# Patient Record
Sex: Female | Born: 1960
Health system: Southern US, Community
[De-identification: ages and names within clinical notes are randomized; demographics above are authoritative.]

## PROBLEM LIST (undated history)

## (undated) DIAGNOSIS — A6000 Herpesviral infection of urogenital system, unspecified: Secondary | ICD-10-CM

## (undated) DIAGNOSIS — F32A Depression, unspecified: Secondary | ICD-10-CM

## (undated) DIAGNOSIS — F329 Major depressive disorder, single episode, unspecified: Secondary | ICD-10-CM

## (undated) DIAGNOSIS — M549 Dorsalgia, unspecified: Secondary | ICD-10-CM

## (undated) DIAGNOSIS — M1990 Unspecified osteoarthritis, unspecified site: Secondary | ICD-10-CM

## (undated) DIAGNOSIS — E119 Type 2 diabetes mellitus without complications: Secondary | ICD-10-CM

## (undated) DIAGNOSIS — B029 Zoster without complications: Secondary | ICD-10-CM

## (undated) DIAGNOSIS — E785 Hyperlipidemia, unspecified: Secondary | ICD-10-CM

## (undated) DIAGNOSIS — I10 Essential (primary) hypertension: Secondary | ICD-10-CM

## (undated) DIAGNOSIS — Z91013 Allergy to seafood: Secondary | ICD-10-CM

## (undated) HISTORY — DX: Herpesviral infection of urogenital system, unspecified: A60.00

## (undated) HISTORY — DX: Hyperlipidemia, unspecified: E78.5

## (undated) HISTORY — DX: Allergy to seafood: Z91.013

## (undated) HISTORY — PX: TYMPANOSTOMY TUBE PLACEMENT: SHX32

## (undated) HISTORY — DX: Major depressive disorder, single episode, unspecified: F32.9

## (undated) HISTORY — DX: Depression, unspecified: F32.A

## (undated) HISTORY — DX: Dorsalgia, unspecified: M54.9

## (undated) HISTORY — DX: Zoster without complications: B02.9

## (undated) HISTORY — DX: Type 2 diabetes mellitus without complications: E11.9

## (undated) HISTORY — DX: Essential (primary) hypertension: I10

---

## 2001-01-30 ENCOUNTER — Other Ambulatory Visit: Admission: RE | Admit: 2001-01-30 | Discharge: 2001-01-30 | Payer: Self-pay | Admitting: Family Medicine

## 2002-02-12 ENCOUNTER — Other Ambulatory Visit: Admission: RE | Admit: 2002-02-12 | Discharge: 2002-02-12 | Payer: Self-pay | Admitting: Family Medicine

## 2002-03-05 ENCOUNTER — Encounter: Admission: RE | Admit: 2002-03-05 | Discharge: 2002-03-05 | Payer: Self-pay | Admitting: Family Medicine

## 2002-03-05 ENCOUNTER — Encounter: Payer: Self-pay | Admitting: Family Medicine

## 2003-04-01 ENCOUNTER — Other Ambulatory Visit: Admission: RE | Admit: 2003-04-01 | Discharge: 2003-04-01 | Payer: Self-pay | Admitting: Family Medicine

## 2003-04-21 ENCOUNTER — Encounter: Payer: Self-pay | Admitting: Family Medicine

## 2003-04-21 ENCOUNTER — Encounter: Admission: RE | Admit: 2003-04-21 | Discharge: 2003-04-21 | Payer: Self-pay | Admitting: Family Medicine

## 2004-06-17 ENCOUNTER — Other Ambulatory Visit: Admission: RE | Admit: 2004-06-17 | Discharge: 2004-06-17 | Payer: Self-pay | Admitting: Family Medicine

## 2004-06-28 ENCOUNTER — Encounter: Admission: RE | Admit: 2004-06-28 | Discharge: 2004-06-28 | Payer: Self-pay | Admitting: Family Medicine

## 2004-11-18 ENCOUNTER — Ambulatory Visit: Payer: Self-pay | Admitting: Family Medicine

## 2004-12-14 ENCOUNTER — Ambulatory Visit: Payer: Self-pay | Admitting: Family Medicine

## 2004-12-26 ENCOUNTER — Ambulatory Visit: Payer: Self-pay | Admitting: Family Medicine

## 2005-09-25 ENCOUNTER — Ambulatory Visit: Payer: Self-pay | Admitting: Family Medicine

## 2006-01-30 ENCOUNTER — Encounter: Admission: RE | Admit: 2006-01-30 | Discharge: 2006-01-30 | Payer: Self-pay | Admitting: Family Medicine

## 2006-03-08 ENCOUNTER — Ambulatory Visit: Payer: Self-pay | Admitting: Family Medicine

## 2006-03-15 ENCOUNTER — Ambulatory Visit: Payer: Self-pay | Admitting: Family Medicine

## 2006-03-15 ENCOUNTER — Other Ambulatory Visit: Admission: RE | Admit: 2006-03-15 | Discharge: 2006-03-15 | Payer: Self-pay | Admitting: Family Medicine

## 2006-03-15 ENCOUNTER — Encounter: Payer: Self-pay | Admitting: Family Medicine

## 2006-06-12 ENCOUNTER — Ambulatory Visit: Payer: Self-pay | Admitting: Family Medicine

## 2006-06-28 ENCOUNTER — Ambulatory Visit: Payer: Self-pay | Admitting: Family Medicine

## 2006-10-11 ENCOUNTER — Ambulatory Visit: Payer: Self-pay | Admitting: Family Medicine

## 2006-10-22 ENCOUNTER — Ambulatory Visit: Payer: Self-pay | Admitting: Family Medicine

## 2007-02-08 ENCOUNTER — Ambulatory Visit: Payer: Self-pay | Admitting: Family Medicine

## 2007-02-13 ENCOUNTER — Ambulatory Visit: Payer: Self-pay | Admitting: Family Medicine

## 2007-02-28 ENCOUNTER — Encounter: Admission: RE | Admit: 2007-02-28 | Discharge: 2007-02-28 | Payer: Self-pay | Admitting: Family Medicine

## 2007-04-17 ENCOUNTER — Ambulatory Visit: Payer: Self-pay | Admitting: Family Medicine

## 2007-04-17 LAB — CONVERTED CEMR LAB
Alkaline Phosphatase: 56 units/L (ref 39–117)
Basophils Relative: 0.3 % (ref 0.0–1.0)
Bilirubin, Direct: 0.1 mg/dL (ref 0.0–0.3)
Chloride: 102 meq/L (ref 96–112)
Cholesterol: 194 mg/dL (ref 0–200)
Direct LDL: 102.5 mg/dL
Eosinophils Absolute: 0.2 10*3/uL (ref 0.0–0.6)
GFR calc Af Amer: 115 mL/min
GFR calc non Af Amer: 95 mL/min
HDL: 35.2 mg/dL — ABNORMAL LOW (ref >39.0)
Lymphocytes Relative: 25.9 % (ref 12.0–46.0)
Monocytes Relative: 6 % (ref 3.0–11.0)
Platelets: 221 10*3/uL (ref 150–400)
Potassium: 4 meq/L (ref 3.5–5.1)
RBC: 4.34 M/uL (ref 3.87–5.11)
RDW: 12.5 % (ref 11.5–14.6)
Sodium: 136 meq/L (ref 135–145)
TSH: 1.17 microintl units/mL (ref 0.35–5.50)
Total CHOL/HDL Ratio: 5.5
Triglycerides: 264 mg/dL (ref 0–149)
VLDL: 53 mg/dL — ABNORMAL HIGH (ref 0–40)

## 2007-04-24 ENCOUNTER — Ambulatory Visit: Payer: Self-pay | Admitting: Family Medicine

## 2007-04-24 ENCOUNTER — Other Ambulatory Visit: Admission: RE | Admit: 2007-04-24 | Discharge: 2007-04-24 | Payer: Self-pay | Admitting: Family Medicine

## 2007-08-01 ENCOUNTER — Telehealth: Payer: Self-pay | Admitting: Family Medicine

## 2007-08-05 DIAGNOSIS — J45909 Unspecified asthma, uncomplicated: Secondary | ICD-10-CM | POA: Insufficient documentation

## 2007-08-07 ENCOUNTER — Ambulatory Visit: Payer: Self-pay | Admitting: Family Medicine

## 2007-08-07 DIAGNOSIS — E785 Hyperlipidemia, unspecified: Secondary | ICD-10-CM | POA: Insufficient documentation

## 2007-08-07 DIAGNOSIS — F418 Other specified anxiety disorders: Secondary | ICD-10-CM | POA: Insufficient documentation

## 2007-08-19 LAB — CONVERTED CEMR LAB
HDL: 34.4 mg/dL — ABNORMAL LOW (ref >39.0)
Hgb A1c MFr Bld: 7.1 % — ABNORMAL HIGH (ref 4.6–6.0)
VLDL: 37 mg/dL (ref 0–40)

## 2007-11-29 ENCOUNTER — Telehealth: Payer: Self-pay | Admitting: Family Medicine

## 2007-12-04 ENCOUNTER — Encounter: Admission: RE | Admit: 2007-12-04 | Discharge: 2007-12-04 | Payer: Self-pay | Admitting: Family Medicine

## 2007-12-06 ENCOUNTER — Other Ambulatory Visit: Admission: RE | Admit: 2007-12-06 | Discharge: 2007-12-06 | Payer: Self-pay | Admitting: Family Medicine

## 2007-12-06 ENCOUNTER — Encounter: Payer: Self-pay | Admitting: Family Medicine

## 2007-12-06 ENCOUNTER — Ambulatory Visit: Payer: Self-pay | Admitting: Family Medicine

## 2007-12-09 LAB — CONVERTED CEMR LAB
ALT: 16 units/L (ref 0–35)
Albumin: 4.2 g/dL (ref 3.5–5.2)
Basophils Relative: 0.3 % (ref 0.0–1.0)
Bilirubin, Direct: 0.2 mg/dL (ref 0.0–0.3)
CO2: 29 meq/L (ref 19–32)
Calcium: 9.3 mg/dL (ref 8.4–10.5)
Cholesterol: 160 mg/dL (ref 0–200)
Creatinine, Ser: 1 mg/dL (ref 0.4–1.2)
GFR calc non Af Amer: 63 mL/min
Glucose, Bld: 112 mg/dL — ABNORMAL HIGH (ref 70–99)
Hgb A1c MFr Bld: 6.7 % — ABNORMAL HIGH (ref 4.6–6.0)
Lymphocytes Relative: 26.2 % (ref 12.0–46.0)
MCHC: 33.9 g/dL (ref 30.0–36.0)
MCV: 85.2 fL (ref 78.0–100.0)
Monocytes Absolute: 0.3 10*3/uL (ref 0.2–0.7)
Monocytes Relative: 5.6 % (ref 3.0–11.0)
Neutro Abs: 4 10*3/uL (ref 1.4–7.7)
Platelets: 245 10*3/uL (ref 150–400)
Potassium: 4.3 meq/L (ref 3.5–5.1)
RDW: 12.7 % (ref 11.5–14.6)
Sodium: 140 meq/L (ref 135–145)
Total Bilirubin: 0.8 mg/dL (ref 0.3–1.2)
Total CHOL/HDL Ratio: 3.9
Total Protein: 6.8 g/dL (ref 6.0–8.3)
VLDL: 27 mg/dL (ref 0–40)

## 2007-12-10 ENCOUNTER — Encounter: Payer: Self-pay | Admitting: Family Medicine

## 2008-12-24 ENCOUNTER — Telehealth: Payer: Self-pay | Admitting: Family Medicine

## 2009-02-23 ENCOUNTER — Encounter: Admission: RE | Admit: 2009-02-23 | Discharge: 2009-02-23 | Payer: Self-pay | Admitting: Family Medicine

## 2009-03-09 ENCOUNTER — Telehealth: Payer: Self-pay | Admitting: Family Medicine

## 2009-04-07 ENCOUNTER — Encounter: Payer: Self-pay | Admitting: Family Medicine

## 2009-04-13 ENCOUNTER — Encounter: Payer: Self-pay | Admitting: Family Medicine

## 2009-04-26 ENCOUNTER — Other Ambulatory Visit: Admission: RE | Admit: 2009-04-26 | Discharge: 2009-04-26 | Payer: Self-pay | Admitting: Family Medicine

## 2009-04-26 ENCOUNTER — Ambulatory Visit: Payer: Self-pay | Admitting: Family Medicine

## 2009-04-26 ENCOUNTER — Encounter: Payer: Self-pay | Admitting: Family Medicine

## 2009-04-26 DIAGNOSIS — E041 Nontoxic single thyroid nodule: Secondary | ICD-10-CM | POA: Insufficient documentation

## 2009-04-26 DIAGNOSIS — N951 Menopausal and female climacteric states: Secondary | ICD-10-CM | POA: Insufficient documentation

## 2009-04-29 ENCOUNTER — Encounter: Payer: Self-pay | Admitting: Family Medicine

## 2009-05-13 ENCOUNTER — Ambulatory Visit: Payer: Self-pay | Admitting: Gastroenterology

## 2009-05-20 ENCOUNTER — Encounter: Payer: Self-pay | Admitting: Gastroenterology

## 2009-05-20 ENCOUNTER — Ambulatory Visit: Payer: Self-pay | Admitting: Gastroenterology

## 2009-05-20 HISTORY — PX: OTHER SURGICAL HISTORY: SHX169

## 2009-05-25 ENCOUNTER — Encounter: Payer: Self-pay | Admitting: Gastroenterology

## 2010-02-16 ENCOUNTER — Ambulatory Visit: Payer: Self-pay | Admitting: Family Medicine

## 2010-02-16 DIAGNOSIS — R079 Chest pain, unspecified: Secondary | ICD-10-CM | POA: Insufficient documentation

## 2010-02-16 LAB — CONVERTED CEMR LAB
Bilirubin Urine: NEGATIVE
Glucose, Urine, Semiquant: NEGATIVE
Nitrite: NEGATIVE
Urobilinogen, UA: 0.2
WBC Urine, dipstick: NEGATIVE

## 2010-02-22 ENCOUNTER — Telehealth: Payer: Self-pay | Admitting: Family Medicine

## 2010-03-07 ENCOUNTER — Encounter: Admission: RE | Admit: 2010-03-07 | Discharge: 2010-03-07 | Payer: Self-pay | Admitting: Family Medicine

## 2010-04-11 ENCOUNTER — Telehealth: Payer: Self-pay | Admitting: Family Medicine

## 2010-04-21 ENCOUNTER — Encounter: Payer: Self-pay | Admitting: Family Medicine

## 2010-04-27 ENCOUNTER — Other Ambulatory Visit: Admission: RE | Admit: 2010-04-27 | Discharge: 2010-04-27 | Payer: Self-pay | Admitting: Family Medicine

## 2010-04-27 ENCOUNTER — Ambulatory Visit: Payer: Self-pay | Admitting: Family Medicine

## 2010-04-27 DIAGNOSIS — R109 Unspecified abdominal pain: Secondary | ICD-10-CM | POA: Insufficient documentation

## 2010-05-10 ENCOUNTER — Ambulatory Visit: Payer: Self-pay | Admitting: Internal Medicine

## 2010-05-17 ENCOUNTER — Encounter: Payer: Self-pay | Admitting: Family Medicine

## 2010-05-17 DIAGNOSIS — D259 Leiomyoma of uterus, unspecified: Secondary | ICD-10-CM | POA: Insufficient documentation

## 2010-05-25 ENCOUNTER — Ambulatory Visit: Payer: Self-pay | Admitting: Family Medicine

## 2010-05-25 DIAGNOSIS — L919 Hypertrophic disorder of the skin, unspecified: Secondary | ICD-10-CM

## 2010-05-25 DIAGNOSIS — L909 Atrophic disorder of skin, unspecified: Secondary | ICD-10-CM | POA: Insufficient documentation

## 2010-05-25 DIAGNOSIS — L989 Disorder of the skin and subcutaneous tissue, unspecified: Secondary | ICD-10-CM | POA: Insufficient documentation

## 2010-06-14 ENCOUNTER — Encounter: Admission: RE | Admit: 2010-06-14 | Discharge: 2010-06-14 | Payer: Self-pay | Admitting: Family Medicine

## 2010-06-23 ENCOUNTER — Encounter: Payer: Self-pay | Admitting: Family Medicine

## 2010-07-22 ENCOUNTER — Encounter: Admission: RE | Admit: 2010-07-22 | Discharge: 2010-07-22 | Payer: Self-pay | Admitting: Family Medicine

## 2010-11-06 DIAGNOSIS — B029 Zoster without complications: Secondary | ICD-10-CM

## 2010-11-06 HISTORY — DX: Zoster without complications: B02.9

## 2010-11-27 ENCOUNTER — Encounter: Payer: Self-pay | Admitting: Family Medicine

## 2010-11-28 ENCOUNTER — Encounter: Payer: Self-pay | Admitting: Family Medicine

## 2010-12-06 NOTE — Assessment & Plan Note (Signed)
Summary: skin tags removal-ok per judt//ccm   Vital Signs:  Patient profile:   50 year old female BP sitting:   136 / 72  (left arm) Cuff size:   large  Vitals Entered By: Raechel Ache, RN (May 25, 2010 1:11 PM) CC: Here for skin tag removal - breast and L thigh.   History of Present Illness: Here to remove 2 skin lesions that are sore and rub against her clothing. One is between the breasts and the other is on the left thigh.   Allergies: 1)  ! Biaxin  Physical Exam  Skin:  she has a tiny inflamed skin tag on the chest between the breasts. The other lesion is on the upper left thigh. This is soft and pliable with a thick base. Diameter is 1.2 cm    Impression & Recommendations:  Problem # 1:  SKIN LESION (ICD-709.9)  Orders: Excise lesion (TAL) 1.1 - 2.0 cm (11402)  Problem # 2:  SKIN TAG (ICD-701.9)  Orders: Removal of Skin Tags up to 15 Lesions (11200)  Patient Instructions: 1)  After cleansing the skin tag with alcohol, it was excised with scissors. dressed with Neosporin and a Bandaid. The larger lesion was cleansed with Betadine, and then LA was achieved using 2% Xylocaine with epinephrine. An elliptical incision was made around the base of the lesion to half of skin thickness, and it was removed in whole to send to Pathology. The wound was closed using 3 sutures of 4-0 Ethilon. Dressed with Neosporin and a Bandaid. The sutures are to be removed in 10 days

## 2010-12-06 NOTE — Miscellaneous (Signed)
Summary: Orders Update  Clinical Lists Changes  Problems: Added new problem of FIBROIDS, UTERUS (ICD-218.9) Orders: Added new Referral order of Radiology Referral (Radiology) - Signed

## 2010-12-06 NOTE — Assessment & Plan Note (Signed)
Summary: constant pain in lft side near kidneys/cjr   Vital Signs:  Patient profile:   50 year old female Weight:      206 pounds Temp:     98.2 degrees F oral BP sitting:   150 / 100  (left arm) Cuff size:   regular  Vitals Entered By: Raechel Ache, RN (February 16, 2010 4:25 PM) CC: C/o L flank pain x months- now is constant. Going out of town next week.   History of Present Illness: Here for a persistent mild sharp pain in the left ribs which has been present for 3 months. No hx of trauma that she can think of. The pain is more of a tenderness when she pushes on the spot, though she can feel it after prolonged sitting or stooping over. No cough or SOB. No back pain. She has not taken anything for this. No change in urinations or BMs. No heartburn or nausea.   Allergies: 1)  ! Biaxin  Past History:  Past Medical History: Reviewed history from 08/07/2007 and no changes required. Vaginal Herpes Asthma Depression Diabetes mellitus, type II Hyperlipidemia Hypertension  Past Surgical History: Reviewed history from 08/05/2007 and no changes required. PE Tubes  Review of Systems  The patient denies anorexia, fever, weight loss, weight gain, vision loss, decreased hearing, hoarseness, syncope, dyspnea on exertion, peripheral edema, prolonged cough, headaches, hemoptysis, abdominal pain, melena, hematochezia, severe indigestion/heartburn, hematuria, incontinence, genital sores, muscle weakness, suspicious skin lesions, transient blindness, difficulty walking, depression, unusual weight change, abnormal bleeding, enlarged lymph nodes, angioedema, breast masses, and testicular masses.    Physical Exam  General:  Well-developed,well-nourished,in no acute distress; alert,appropriate and cooperative throughout examination Neck:  No deformities, masses, or tenderness noted. Chest Wall:  tender in a very well defined spot in the left ribs in the midaxillary line at the level of the  nipple. No swelling or crepitus. Lungs:  Normal respiratory effort, chest expands symmetrically. Lungs are clear to auscultation, no crackles or wheezes. Heart:  Normal rate and regular rhythm. S1 and S2 normal without gallop, murmur, click, rub or other extra sounds. Abdomen:  Bowel sounds positive,abdomen soft and non-tender without masses, organomegaly or hernias noted. Skin:  Intact without suspicious lesions or rashes Cervical Nodes:  No lymphadenopathy noted Axillary Nodes:  No palpable lymphadenopathy   Impression & Recommendations:  Problem # 1:  RIB PAIN (ICD-786.50)  Patient Instructions: 1)  This is very superficial, and it is limited to the chest wall. Possible etiologies include an irritated intercostal nerve. Will get a rib Xray series to rule out any bony lesions. Try 2 tablets of Aleeve two times a day.   Laboratory Results   Urine Tests    Routine Urinalysis   Color: straw Appearance: Clear Glucose: negative   (Normal Range: Negative) Bilirubin: negative   (Normal Range: Negative) Ketone: negative   (Normal Range: Negative) Spec. Gravity: 1.010   (Normal Range: 1.003-1.035) Blood: negative   (Normal Range: Negative) pH: 5.0   (Normal Range: 5.0-8.0) Protein: negative   (Normal Range: Negative) Urobilinogen: 0.2   (Normal Range: 0-1) Nitrite: negative   (Normal Range: Negative) Leukocyte Esterace: negative   (Normal Range: Negative)

## 2010-12-06 NOTE — Progress Notes (Signed)
Summary: cancelling xray  Phone Note Call from Patient   Caller: Patient Call For: Nelwyn Salisbury MD Summary of Call: FYI:  Pt wants Dr. Clent Ridges to know she is not having the xrays of her ribs right now as she is much better, and will call back if pain comes back. Initial call taken by: Lynann Beaver CMA,  February 22, 2010 8:33 AM  Follow-up for Phone Call        noted Follow-up by: Nelwyn Salisbury MD,  February 22, 2010 1:04 PM

## 2010-12-06 NOTE — Assessment & Plan Note (Signed)
Summary: cpx-pap/ccm   Vital Signs:  Patient profile:   50 year old female Weight:      206 pounds BMI:     32.14 Pulse rate:   72 / minute Pulse rhythm:   regular BP sitting:   158 / 88  (left arm) Cuff size:   regular  Vitals Entered By: Raechel Ache, RN (April 27, 2010 1:30 PM) CC: CPX, labs done elsewhere. C/o LUQ pain which radiates to midline. C/o skin tag breast and mole L thigh.   History of Present Illness: 50 yr old female for a cpx. She feels good in general except for the chronic left flank pain that we looked at last time. This has been going on for 5 months now. it comes and goes, it is not severe, and it does not seem to be changing at all. No nausea or fevers, no change in urinations or BMs. He menses are regular. She is trying to watch her diet and exercise some. Her recent A1c was 6.7 (down from 7.6 a year ago).   Allergies: 1)  ! Biaxin  Past History:  Past Medical History: Reviewed history from 08/07/2007 and no changes required. Vaginal Herpes Asthma Depression Diabetes mellitus, type II Hyperlipidemia Hypertension  Past Surgical History: PE Tubes colonoscopy 05-20-09 per Dr. Arlyce Dice, hyperplastic polyps, repeat 10 yrs  Family History: Reviewed history from 08/05/2007 and no changes required. Family History of Arthritis Family History of Bowel disease Family History of Cervical cancer Family History Depression Family History Diabetes 1st degree relative Family History of Ulcers  Social History: Reviewed history from 08/05/2007 and no changes required. Occupation: Married Former Smoker Alcohol use-yes Drug use-no  Review of Systems  The patient denies anorexia, fever, weight loss, weight gain, vision loss, decreased hearing, hoarseness, chest pain, syncope, dyspnea on exertion, peripheral edema, prolonged cough, headaches, hemoptysis, melena, hematochezia, severe indigestion/heartburn, hematuria, incontinence, genital sores, muscle weakness,  suspicious skin lesions, transient blindness, difficulty walking, depression, unusual weight change, abnormal bleeding, enlarged lymph nodes, angioedema, breast masses, and testicular masses.    Physical Exam  General:  overweight-appearing.   Head:  Normocephalic and atraumatic without obvious abnormalities. No apparent alopecia or balding. Eyes:  No corneal or conjunctival inflammation noted. EOMI. Perrla. Funduscopic exam benign, without hemorrhages, exudates or papilledema. Vision grossly normal. Ears:  External ear exam shows no significant lesions or deformities.  Otoscopic examination reveals clear canals, tympanic membranes are intact bilaterally without bulging, retraction, inflammation or discharge. Hearing is grossly normal bilaterally. Nose:  External nasal examination shows no deformity or inflammation. Nasal mucosa are pink and moist without lesions or exudates. Mouth:  Oral mucosa and oropharynx without lesions or exudates.  Teeth in good repair. Neck:  No deformities, masses, or tenderness noted. Chest Wall:  No deformities, masses, or tenderness noted. Breasts:  No mass, nodules, thickening, tenderness, bulging, retraction, inflamation, nipple discharge or skin changes noted.   Lungs:  Normal respiratory effort, chest expands symmetrically. Lungs are clear to auscultation, no crackles or wheezes. Heart:  Normal rate and regular rhythm. S1 and S2 normal without gallop, murmur, click, rub or other extra sounds. EKG normal Abdomen:  Bowel sounds positive,abdomen soft and non-tender without masses, organomegaly or hernias noted. Genitalia:  Pelvic Exam:        External: normal female genitalia without lesions or masses        Vagina: normal without lesions or masses        Cervix: normal without lesions or masses  Adnexa: normal bimanual exam without masses or fullness        Uterus: normal by palpation        Pap smear: performed Msk:  No deformity or scoliosis noted of  thoracic or lumbar spine.   Pulses:  R and L carotid,radial,femoral,dorsalis pedis and posterior tibial pulses are full and equal bilaterally Extremities:  No clubbing, cyanosis, edema, or deformity noted with normal full range of motion of all joints.   Neurologic:  No cranial nerve deficits noted. Station and gait are normal. Plantar reflexes are down-going bilaterally. DTRs are symmetrical throughout. Sensory, motor and coordinative functions appear intact. Skin:  Intact without suspicious lesions or rashes Cervical Nodes:  No lymphadenopathy noted Axillary Nodes:  No palpable lymphadenopathy Inguinal Nodes:  No significant adenopathy Psych:  Cognition and judgment appear intact. Alert and cooperative with normal attention span and concentration. No apparent delusions, illusions, hallucinations   Impression & Recommendations:  Problem # 1:  WELL ADULT EXAM (ICD-V70.0)  Orders: EKG w/ Interpretation (93000)  Problem # 2:  FLANK PAIN, LEFT (ICD-789.09)  Orders: Radiology Referral (Radiology)  Patient Instructions: 1)  It is important that you exercise reguarly at least 20 minutes 5 times a week. If you develop chest pain, have severe difficulty breathing, or feel very tired, stop exercising immediately and seek medical attention.  2)  You need to lose weight. Consider a lower calorie diet and regular exercise.  3)  I am not sure of the etiology of her left flank pain, but we will evaluate with an MRI soon.

## 2010-12-06 NOTE — Miscellaneous (Signed)
Summary: Orders Update   Clinical Lists Changes  Orders: Added new Referral order of Radiology Referral (Radiology) - Signed 

## 2010-12-06 NOTE — Progress Notes (Signed)
Summary: need order  Phone Note Call from Patient Call back at [phone]   Caller: Patient----live call Summary of Call: cpx is 04-27-2010. need order for cpx labs--v70.0 faxed to [facility] at [phone]. Attn: Altamease Oiler. Initial call taken by: Warnell Forester,  April 11, 2010 1:47 PM  Follow-up for Phone Call        FAXED ORDER FOR LABS. Follow-up by: Raechel Ache, RN,  April 11, 2010 2:10 PM

## 2011-03-20 ENCOUNTER — Telehealth: Payer: Self-pay | Admitting: Family Medicine

## 2011-03-20 ENCOUNTER — Other Ambulatory Visit: Payer: Self-pay | Admitting: Family Medicine

## 2011-03-20 DIAGNOSIS — Z1231 Encounter for screening mammogram for malignant neoplasm of breast: Secondary | ICD-10-CM

## 2011-03-20 NOTE — Telephone Encounter (Signed)
Pt has a cpx in June and would like an order for her cpx labs---v70.0, to be faxed to [phone]. She would like to do labs at her job.

## 2011-03-21 NOTE — Telephone Encounter (Signed)
Done, in your box

## 2011-03-21 NOTE — Assessment & Plan Note (Signed)
[facility] OFFICE NOTE   Tracy Anderson, Tracy Anderson                 MRN:          [id number]  DATE:04/24/2007                            DOB:          03-12-1961    HISTORY OF PRESENT ILLNESS:  This is a 50 year old woman here for  complete physical examination.  In general, she is doing well but does  have one thing to describe.  Most of her adult life she has had problems  with some mild constipation and averaged 2-3 bowel movements a week.  Now, over the last year, she has been slowly getting much more frequency  of bowel movements as well as looser  bowel movements.  Now she is  averaging a couple of loose bowel movements a day, often with frank  diarrhea.  She has also had some increased mild cramping diffusely  through the abdomen.  She has seen no blood in the stool.  There has  been no fevers.  She is concerned about a strong family history of  irritable bowel disease as noted below.  She did have a normal mammogram  in April of this year.  Her periods remain on a regular cycle.  She  continues to watch her diet, but has not been exercising very much over  the past six months, and this has resulted in some significant weight  gain.  For further details of her past medical history, family history,  social history, habits, etc, refer to her last physical noted dated Mar 15, 2006.   ALLERGIES:  Biaxin causes nausea.   CURRENT MEDICATIONS:  Wellbutrin XR 150 mg b.i.d.   OBJECTIVE:  VITAL SIGNS:  Height 5 feet 8 inches, weight 216 pounds  (which is up 12 pounds in the past two months), blood pressure 132/92,  pulse 92 and regular.  GENERAL:  She is obese.  SKIN:  Free of significant lesions.  HEENT:  Eyes clear.  Ears clear.  Oropharynx clear.  NECK:  Supple without lymphadenopathy or masses.  LUNGS:  Clear.  CARDIAC:  Regular rate and rhythm without murmurs, rubs or gallops.  Distal pulses are full.  BREASTS/AXILLA:  Clear.  ABDOMEN:  Soft, normal bowel sounds, nontender, no masses.  PELVIC:  External genitalia within normal limits.  Vagina__________.  Uterus not enlarged.  Adnexa:  No masses or tenderness.  EXTREMITIES:  No cyanosis, clubbing or edema.  NEUROLOGICAL:  Grossly intact.   LABORATORY DATA:  She was here for fasting labs on June 11.  This was  remarkable for fasting glucose of 172 and an elevated hemoglobin A1C to  8.1.  Also, she had an abnormal lipid panel with triglycerides of 264,  HDL low at 35, LDL high at 53 and LDL high at 102.  The remainder of her  laboratories were within normal limits.   ASSESSMENT/PLAN:  1. Complete physical.  I encouraged her to watch her diet more      strictly and to get more exercise.  She really does need to loose      some weight.  2. Depression, stable.  3. Type 2  diabetes mellitus.  I initially wanted to put her oral      medications, but she was reluctant.  She promised me she would give      diet and exercise a try for the next three months.  We will see her      back in three months and recheck at that point.  4. Hypertension. Again, I think loosing weight will help.  We will      continue to monitor this over the next three months.  5 . Hyperlipidemia.  We will check this in three months as well.  1. Concerns over changing bowel habits with a family history of      irritable bowel disease.  We will set her up for a screening      colonoscopy in the near future.  I also advised her to get plenty      of fiber in her diet.     Tera Mater. Clent Ridges, MD  Electronically Signed    SAF/MedQ  DD: 04/24/2007  DT: 04/24/2007  Job #: [id number]

## 2011-03-22 NOTE — Telephone Encounter (Signed)
Faxed and left message for patient

## 2011-04-11 ENCOUNTER — Ambulatory Visit
Admission: RE | Admit: 2011-04-11 | Discharge: 2011-04-11 | Disposition: A | Discharge status: Home / Self Care | Payer: 59 | Source: Ambulatory Visit | Attending: Family Medicine | Admitting: Family Medicine

## 2011-04-11 DIAGNOSIS — Z1231 Encounter for screening mammogram for malignant neoplasm of breast: Secondary | ICD-10-CM

## 2011-04-28 ENCOUNTER — Telehealth: Payer: Self-pay | Admitting: Family Medicine

## 2011-04-28 NOTE — Telephone Encounter (Signed)
Pt had to cx her cpx that was sch for 05/04/11 due to critical conflict at pts work. Pt needs work in cpx, before 05/25/11 so that pts cpx labs will still be effective. Pls advise.

## 2011-04-28 NOTE — Telephone Encounter (Signed)
Okay to switch this appt

## 2011-05-04 ENCOUNTER — Encounter: Payer: Self-pay | Admitting: Family Medicine

## 2011-05-05 ENCOUNTER — Other Ambulatory Visit (HOSPITAL_COMMUNITY)
Admission: RE | Admit: 2011-05-05 | Discharge: 2011-05-05 | Disposition: A | Discharge status: Home / Self Care | Payer: 59 | Source: Ambulatory Visit | Attending: Family Medicine | Admitting: Family Medicine

## 2011-05-05 ENCOUNTER — Encounter: Payer: Self-pay | Admitting: Family Medicine

## 2011-05-05 ENCOUNTER — Ambulatory Visit (INDEPENDENT_AMBULATORY_CARE_PROVIDER_SITE_OTHER): Payer: 59 | Admitting: Family Medicine

## 2011-05-05 VITALS — BP 140/96 | Temp 98.1°F | Ht 67.0 in | Wt 212.0 lb

## 2011-05-05 DIAGNOSIS — Z01419 Encounter for gynecological examination (general) (routine) without abnormal findings: Secondary | ICD-10-CM | POA: Insufficient documentation

## 2011-05-05 DIAGNOSIS — Z0000 Encounter for general adult medical examination without abnormal findings: Secondary | ICD-10-CM

## 2011-05-05 DIAGNOSIS — Z23 Encounter for immunization: Secondary | ICD-10-CM

## 2011-05-05 MED ORDER — LISINOPRIL-HYDROCHLOROTHIAZIDE 10-12.5 MG PO TABS: 1.0000 | ORAL_TABLET | Freq: Every day | ORAL | Status: DC

## 2011-05-05 MED ORDER — ACYCLOVIR 400 MG PO TABS: 400.0000 mg | ORAL_TABLET | ORAL | Status: AC

## 2011-05-05 MED ORDER — ACYCLOVIR 5 % EX OINT: TOPICAL_OINTMENT | CUTANEOUS | Status: AC

## 2011-05-05 NOTE — Progress Notes (Signed)
Subjective:    Patient ID: Tracy Anderson, female    DOB: 05/30/1961, 50 y.o.   MRN: [id number]  HPI 50 yr old female for a cpx. She has only one complaint, and that is to look at a lump that came up on the back of her left hand one month ago. It is not painful at all. She admits to not exercising, and she has gained some weight .    Review of Systems  Constitutional: Negative.  Negative for fever, diaphoresis, activity change, appetite change, fatigue and unexpected weight change.  HENT: Negative.  Negative for hearing loss, ear pain, nosebleeds, congestion, sore throat, trouble swallowing, neck pain, neck stiffness, voice change and tinnitus.   Eyes: Negative.  Negative for photophobia, pain, discharge, redness and visual disturbance.  Respiratory: Negative.  Negative for apnea, cough, choking, chest tightness, shortness of breath, wheezing and stridor.   Cardiovascular: Negative.  Negative for chest pain, palpitations and leg swelling.  Gastrointestinal: Negative.  Negative for nausea, vomiting, abdominal pain, diarrhea, constipation, blood in stool, abdominal distention and rectal pain.  Genitourinary: Negative.  Negative for dysuria, urgency, frequency, hematuria, flank pain, vaginal bleeding, vaginal discharge, enuresis, difficulty urinating, vaginal pain and menstrual problem.  Musculoskeletal: Negative.  Negative for myalgias, back pain, joint swelling, arthralgias and gait problem.  Skin: Negative.  Negative for color change, pallor, rash and wound.  Neurological: Negative.  Negative for dizziness, tremors, seizures, syncope, speech difficulty, weakness, light-headedness, numbness and headaches.  Hematological: Negative.  Negative for adenopathy. Does not bruise/bleed easily.  Psychiatric/Behavioral: Negative.  Negative for hallucinations, behavioral problems, confusion, sleep disturbance, dysphoric mood and agitation. The patient is not nervous/anxious.        Objective:   Physical Exam  Constitutional: She appears well-developed and well-nourished. No distress.  HENT:  Head: Normocephalic and atraumatic.  Right Ear: External ear normal.  Left Ear: External ear normal.  Nose: Nose normal.  Mouth/Throat: Oropharynx is clear and moist. No oropharyngeal exudate.  Eyes: Conjunctivae and EOM are normal. Pupils are equal, round, and reactive to light. Right eye exhibits no discharge. Left eye exhibits no discharge. No scleral icterus.  Neck: Normal range of motion. Neck supple. No JVD present. No thyromegaly present.  Cardiovascular: Normal rate, regular rhythm, normal heart sounds and intact distal pulses.  Exam reveals no gallop and no friction rub.   No murmur heard.      EKG normal   Pulmonary/Chest: Effort normal and breath sounds normal. No stridor. No respiratory distress. She has no wheezes. She has no rales. She exhibits no tenderness.  Abdominal: Soft. Normal appearance and bowel sounds are normal. She exhibits no distension, no abdominal bruit, no ascites and no mass. There is no hepatosplenomegaly. There is no tenderness. There is no rigidity, no rebound and no guarding. No hernia.  Genitourinary: Rectum normal, vagina normal and uterus normal. Guaiac negative stool. No breast swelling, tenderness, discharge or bleeding. Cervix exhibits no motion tenderness, no discharge and no friability. Right adnexum displays no mass, no tenderness and no fullness. Left adnexum displays no mass, no tenderness and no fullness. No erythema, tenderness or bleeding around the vagina. No vaginal discharge found.  Musculoskeletal: Normal range of motion. She exhibits no edema and no tenderness.  Lymphadenopathy:    She has no cervical adenopathy.  Neurological: She is alert. She has normal reflexes. No cranial nerve deficit. She exhibits normal muscle tone. Coordination normal.  Skin: Skin is warm and dry. No rash noted. She is not diaphoretic.  No erythema. No pallor.    Psychiatric: She has a normal mood and affect. Her behavior is normal. Judgment and thought content normal.          Assessment & Plan:  She has HTN, and we will begin treating with Lisinopril HCT. She will watch her diet and exercise. Recheck in one month. She has a ganglion cyst on the left hand, and we will just observe this for now

## 2011-05-09 ENCOUNTER — Telehealth: Payer: Self-pay | Admitting: Family Medicine

## 2011-05-09 NOTE — Telephone Encounter (Signed)
Pt is requesting a generic for Acyclovir or something similar to that because the name brand is too expensive.

## 2011-05-11 ENCOUNTER — Encounter: Payer: Self-pay | Admitting: Family Medicine

## 2011-05-11 NOTE — Telephone Encounter (Signed)
Acyclovir is already generic. There is nothing else to use

## 2011-05-11 NOTE — Telephone Encounter (Signed)
Left message

## 2011-05-18 ENCOUNTER — Telehealth: Payer: Self-pay | Admitting: Family Medicine

## 2011-05-18 NOTE — Telephone Encounter (Signed)
Pharmacy request, Acyclovir 400 mg tab is on back order. Qty # 60, take 1 po q4hrs while awake. Can you change to another medication? Pt was last here on 05/05/11.

## 2011-05-18 NOTE — Telephone Encounter (Signed)
Cant tell what to do /She was on Valtrex before  And can go back to this. Contact patient about this and what she wants to do.

## 2011-05-18 NOTE — Telephone Encounter (Signed)
Left voice message for pt to return my call. See below note.

## 2011-05-22 ENCOUNTER — Telehealth: Payer: Self-pay | Admitting: Family Medicine

## 2011-05-22 NOTE — Telephone Encounter (Signed)
Pharmacy request- Acyclovir 400 mg is on backorder.Can you please change to another medication?

## 2011-05-23 MED ORDER — VALACYCLOVIR HCL 500 MG PO TABS: 500.0000 mg | ORAL_TABLET | Freq: Two times a day (BID) | ORAL | Status: DC | PRN

## 2011-05-23 NOTE — Telephone Encounter (Signed)
DONE

## 2011-05-23 NOTE — Telephone Encounter (Signed)
Cancel the Acyclovir and change to Valtrex 500 mg to take bid prn. Call in #[phone] with 5 rf

## 2011-06-07 ENCOUNTER — Encounter: Payer: Self-pay | Admitting: Family Medicine

## 2011-06-07 ENCOUNTER — Ambulatory Visit (INDEPENDENT_AMBULATORY_CARE_PROVIDER_SITE_OTHER): Payer: 59 | Admitting: Family Medicine

## 2011-06-07 VITALS — BP 130/80 | HR 77 | Temp 97.7°F | Wt 207.0 lb

## 2011-06-07 DIAGNOSIS — I10 Essential (primary) hypertension: Secondary | ICD-10-CM

## 2011-06-07 MED ORDER — LISINOPRIL-HYDROCHLOROTHIAZIDE 10-12.5 MG PO TABS: 1.0000 | ORAL_TABLET | Freq: Every day | ORAL | Status: DC

## 2011-06-07 NOTE — Progress Notes (Signed)
  Subjective:    Patient ID: Tracy Anderson, female    DOB: 1961-04-26, 51 y.o.   MRN: [id number]  HPI Here to recheck HTN after starting on meds for this. She feels great, and she has lost 5 lbs.    Review of Systems  Constitutional: Negative.   Respiratory: Negative.   Cardiovascular: Negative.        Objective:   Physical Exam  Constitutional: She appears well-developed and well-nourished.  Cardiovascular: Normal rate, regular rhythm, normal heart sounds and intact distal pulses.   Pulmonary/Chest: Effort normal and breath sounds normal.          Assessment & Plan:  Continue current regimen.

## 2011-06-08 ENCOUNTER — Telehealth: Payer: Self-pay | Admitting: Family Medicine

## 2011-06-08 MED ORDER — METFORMIN HCL 500 MG PO TABS: 500.0000 mg | ORAL_TABLET | Freq: Two times a day (BID) | ORAL | Status: DC

## 2011-06-08 NOTE — Telephone Encounter (Signed)
Script called in and left voice message for pt. 

## 2011-06-26 ENCOUNTER — Telehealth: Payer: Self-pay | Admitting: Family Medicine

## 2011-06-26 MED ORDER — ACCU-CHEK FASTCLIX LANCETS MISC: 1.0000 | Freq: Once | Status: DC

## 2011-06-26 NOTE — Telephone Encounter (Signed)
Pt called and requested strips for Accucheck Fast Click, call to pharmacy. Also wanted to go over lab results.

## 2011-09-18 ENCOUNTER — Telehealth: Payer: Self-pay | Admitting: *Deleted

## 2011-09-18 NOTE — Telephone Encounter (Signed)
Pt's last period lasted 16 days and was heavier than usual.  Asking if she should be worried about this.?

## 2011-09-19 NOTE — Telephone Encounter (Signed)
Left voice message.

## 2011-09-19 NOTE — Telephone Encounter (Signed)
Not necessarily. Let's see what happens next month

## 2011-10-03 ENCOUNTER — Telehealth: Payer: Self-pay | Admitting: Family Medicine

## 2011-10-03 NOTE — Telephone Encounter (Signed)
Needs new rx for Accu -ckeck Nano teststrips sent to [facility]. Thanks.

## 2011-10-05 MED ORDER — GLUCOSE BLOOD VI STRP: ORAL_STRIP | Status: DC

## 2011-10-05 NOTE — Telephone Encounter (Signed)
Call in a one year supply  

## 2011-10-05 NOTE — Telephone Encounter (Signed)
Rx called in 

## 2011-12-12 ENCOUNTER — Telehealth: Payer: Self-pay | Admitting: Family Medicine

## 2011-12-12 ENCOUNTER — Other Ambulatory Visit: Payer: Self-pay | Admitting: Family Medicine

## 2011-12-12 DIAGNOSIS — Z1231 Encounter for screening mammogram for malignant neoplasm of breast: Secondary | ICD-10-CM

## 2011-12-12 NOTE — Telephone Encounter (Signed)
She last had wellness labs drawn there on 04-25-11. Insurance companies will only pay for this to be done once a year. If her exam is not until 05-08-12 I suggest she not get the labs drawn until a week or to before that. Tell her to call and ask [place] again in late June and we will do so

## 2011-12-12 NOTE — Telephone Encounter (Signed)
Pt has sch cpx for 05/08/12 at 9am. Pt is req to get a cpx lab order faxed over to [facility], so she get labs done at her work. Fax # [phone]

## 2011-12-13 NOTE — Telephone Encounter (Signed)
Left voice message.

## 2012-04-11 ENCOUNTER — Ambulatory Visit
Admission: RE | Admit: 2012-04-11 | Discharge: 2012-04-11 | Disposition: A | Discharge status: Home / Self Care | Payer: 59 | Source: Ambulatory Visit | Attending: Family Medicine | Admitting: Family Medicine

## 2012-04-11 DIAGNOSIS — Z1231 Encounter for screening mammogram for malignant neoplasm of breast: Secondary | ICD-10-CM

## 2012-04-26 ENCOUNTER — Telehealth: Payer: Self-pay | Admitting: Family Medicine

## 2012-04-26 NOTE — Telephone Encounter (Signed)
I faxed script with lab orders to [phone].

## 2012-04-26 NOTE — Telephone Encounter (Signed)
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 Pt requesting to have this before 05/01/12  Fax [phone]

## 2012-04-26 NOTE — Telephone Encounter (Signed)
Done, in your box

## 2012-05-08 ENCOUNTER — Encounter: Payer: 59 | Admitting: Family Medicine

## 2012-05-13 ENCOUNTER — Other Ambulatory Visit (HOSPITAL_COMMUNITY)
Admission: RE | Admit: 2012-05-13 | Discharge: 2012-05-13 | Disposition: A | Discharge status: Home / Self Care | Payer: 59 | Source: Ambulatory Visit | Attending: Family Medicine | Admitting: Family Medicine

## 2012-05-13 ENCOUNTER — Ambulatory Visit (INDEPENDENT_AMBULATORY_CARE_PROVIDER_SITE_OTHER): Payer: 59 | Admitting: Family Medicine

## 2012-05-13 ENCOUNTER — Encounter: Payer: Self-pay | Admitting: Family Medicine

## 2012-05-13 VITALS — BP 138/84 | HR 97 | Temp 98.5°F | Ht 67.0 in | Wt 203.0 lb

## 2012-05-13 DIAGNOSIS — Z0000 Encounter for general adult medical examination without abnormal findings: Secondary | ICD-10-CM

## 2012-05-13 DIAGNOSIS — Z01419 Encounter for gynecological examination (general) (routine) without abnormal findings: Secondary | ICD-10-CM | POA: Insufficient documentation

## 2012-05-13 NOTE — Progress Notes (Signed)
Subjective:    Patient ID: Tracy Anderson, female    DOB: 06-Feb-1961, 51 y.o.   MRN: [id number]  HPI 51 yr old female for a cpx. She feels fine and has no concerns. She had labs drawn recently that showed an A1c of 7.7. Her fasting glucoses at home have been running from 120 to 200. She watches her diet but does not exercise at all.    Review of Systems  Constitutional: Negative.  Negative for fever, diaphoresis, activity change, appetite change, fatigue and unexpected weight change.  HENT: Negative.  Negative for hearing loss, ear pain, nosebleeds, congestion, sore throat, trouble swallowing, neck pain, neck stiffness, voice change and tinnitus.   Eyes: Negative.  Negative for photophobia, pain, discharge, redness and visual disturbance.  Respiratory: Negative.  Negative for apnea, cough, choking, chest tightness, shortness of breath, wheezing and stridor.   Cardiovascular: Negative.  Negative for chest pain, palpitations and leg swelling.  Gastrointestinal: Negative.  Negative for nausea, vomiting, abdominal pain, diarrhea, constipation, blood in stool, abdominal distention and rectal pain.  Genitourinary: Negative.  Negative for dysuria, urgency, frequency, hematuria, flank pain, vaginal bleeding, vaginal discharge, enuresis, difficulty urinating, vaginal pain and menstrual problem.  Musculoskeletal: Negative.  Negative for myalgias, back pain, joint swelling, arthralgias and gait problem.  Skin: Negative.  Negative for color change, pallor, rash and wound.  Neurological: Negative.  Negative for dizziness, tremors, seizures, syncope, speech difficulty, weakness, light-headedness, numbness and headaches.  Hematological: Negative.  Negative for adenopathy. Does not bruise/bleed easily.  Psychiatric/Behavioral: Negative.  Negative for hallucinations, behavioral problems, confusion, disturbed wake/sleep cycle, dysphoric mood and agitation. The patient is not nervous/anxious.          Objective:   Physical Exam  Constitutional: She appears well-developed and well-nourished. No distress.  HENT:  Head: Normocephalic and atraumatic.  Right Ear: External ear normal.  Left Ear: External ear normal.  Nose: Nose normal.  Mouth/Throat: Oropharynx is clear and moist. No oropharyngeal exudate.  Eyes: Conjunctivae and EOM are normal. Pupils are equal, round, and reactive to light. Right eye exhibits no discharge. Left eye exhibits no discharge. No scleral icterus.  Neck: Normal range of motion. Neck supple. No JVD present. No thyromegaly present.  Cardiovascular: Normal rate, regular rhythm, normal heart sounds and intact distal pulses.  Exam reveals no gallop and no friction rub.   No murmur heard.      EKG normal  Pulmonary/Chest: Effort normal and breath sounds normal. No stridor. No respiratory distress. She has no wheezes. She has no rales. She exhibits no tenderness.  Abdominal: Soft. Normal appearance and bowel sounds are normal. She exhibits no distension, no abdominal bruit, no ascites and no mass. There is no hepatosplenomegaly. There is no tenderness. There is no rigidity, no rebound and no guarding. No hernia.  Genitourinary: Rectum normal, vagina normal and uterus normal. No breast swelling, tenderness, discharge or bleeding. Cervix exhibits no motion tenderness, no discharge and no friability. Right adnexum displays no mass, no tenderness and no fullness. Left adnexum displays no mass, no tenderness and no fullness. No erythema, tenderness or bleeding around the vagina. No vaginal discharge found.  Musculoskeletal: Normal range of motion. She exhibits no edema and no tenderness.  Lymphadenopathy:    She has no cervical adenopathy.  Neurological: She is alert. She has normal reflexes. No cranial nerve deficit. She exhibits normal muscle tone. Coordination normal.  Skin: Skin is warm and dry. No rash noted. She is not diaphoretic. No erythema. No pallor.  Psychiatric: She  has a normal mood and affect. Her behavior is normal. Judgment and thought content normal.          Assessment & Plan:  Well exam. I advised her to exercise 30-45 minutes 7 days a week. We will increase her Metformin to 1000 mg bid. Recheck in 90 days

## 2012-05-14 MED ORDER — VALACYCLOVIR HCL 500 MG PO TABS: 500.0000 mg | ORAL_TABLET | Freq: Two times a day (BID) | ORAL | Status: AC | PRN

## 2012-05-14 MED ORDER — METFORMIN HCL 1000 MG PO TABS: 1000.0000 mg | ORAL_TABLET | Freq: Two times a day (BID) | ORAL | Status: DC

## 2012-05-14 MED ORDER — LISINOPRIL-HYDROCHLOROTHIAZIDE 10-12.5 MG PO TABS: 1.0000 | ORAL_TABLET | Freq: Every day | ORAL | Status: DC

## 2012-05-14 NOTE — Addendum Note (Signed)
Addended by: Gershon Crane A on: 05/14/2012 04:59 AM   Modules accepted: Orders

## 2012-05-20 NOTE — Progress Notes (Signed)
Quick Note:  I spoke with pt ______ 

## 2012-05-23 ENCOUNTER — Telehealth: Payer: Self-pay | Admitting: Family Medicine

## 2012-05-23 NOTE — Telephone Encounter (Signed)
Dr. Clent Ridges, I received a prior auth on Valtrex for this pt. Per her chart, it was ordered on 7-9, stopped on 7-14. Is she currently taking this med, or can I disregard this request from [facility]? Thanks!

## 2012-05-24 NOTE — Telephone Encounter (Signed)
She is not taking Valtrex any more, so disregard any PA requests

## 2012-07-24 ENCOUNTER — Telehealth: Payer: Self-pay | Admitting: Family Medicine

## 2012-07-24 MED ORDER — METFORMIN HCL 1000 MG PO TABS: 1000.0000 mg | ORAL_TABLET | Freq: Two times a day (BID) | ORAL | Status: DC

## 2012-07-24 NOTE — Telephone Encounter (Signed)
Caller: Tracy Anderson/Patient; Phone: [phone]; Reason for Call: Patient calling to get a new prescription for her Metformin, states Dr.  Clent Ridges changed the dosage at her last appt.  Patient is requesting a 90 day supply.  She uses Psychologist, forensic at [facility].  Thanks

## 2012-07-24 NOTE — Telephone Encounter (Signed)
I sent script e-scribe. 

## 2012-09-04 ENCOUNTER — Telehealth: Payer: Self-pay | Admitting: Family Medicine

## 2012-09-04 NOTE — Telephone Encounter (Signed)
Pt called and said that she needs to get a lab order to have a1c and any other labs pt may need, faxed to pts work so pt can have the labs drawn there. Pt works for [employer]. Pls fax order to fax # [phone].

## 2012-09-05 NOTE — Telephone Encounter (Signed)
Script was wrote and faxed to the below number.

## 2012-09-05 NOTE — Telephone Encounter (Signed)
We will check an A1c and a lipid panel.

## 2012-09-17 ENCOUNTER — Telehealth: Payer: Self-pay | Admitting: Family Medicine

## 2012-09-17 MED ORDER — GLIPIZIDE 10 MG PO TABS: 10.0000 mg | ORAL_TABLET | Freq: Two times a day (BID) | ORAL | Status: DC

## 2012-09-17 NOTE — Telephone Encounter (Signed)
Pt called and left a voice message. She has only been taking 1 tablet of the Metformin a day, not 2. I just called in a script for Glipizide.  What do you advise due to the recent lab results?

## 2012-09-17 NOTE — Telephone Encounter (Signed)
Lab results are back and pt's A1C is still high. Per Dr. Clent Ridges, add Glipizide 10 mg take po bid and I did send e-scribe to pharmacy. I left a detailed voice message for pt and put a copy of results in mail.

## 2012-09-18 NOTE — Telephone Encounter (Signed)
Per Dr. Clent Ridges pt can take 1 tablet of Metformin and still add the Glipizide. I spoke with pt and gave the new directions.

## 2012-09-18 NOTE — Telephone Encounter (Signed)
Cancel the Glipizide and tell her to take the Metformin bid. Call in refills of this if needed

## 2012-10-22 ENCOUNTER — Telehealth: Payer: Self-pay | Admitting: Family Medicine

## 2012-10-22 MED ORDER — CIPROFLOXACIN HCL 500 MG PO TABS: 500.0000 mg | ORAL_TABLET | Freq: Two times a day (BID) | ORAL | Status: DC

## 2012-10-22 NOTE — Telephone Encounter (Signed)
Per Dr. Clent Ridges, call in Cipro for UTI. I did send script e-scribe and left voice message for pt.

## 2012-12-30 ENCOUNTER — Telehealth: Payer: Self-pay | Admitting: Family Medicine

## 2012-12-30 NOTE — Telephone Encounter (Signed)
Yes I can prescribe this. Come in to talk about it

## 2012-12-30 NOTE — Telephone Encounter (Signed)
Pt would like to know if MD would consider putting her on Qysmia for wt loss. Pt will make ov if med is something MD will prescribe

## 2012-12-30 NOTE — Telephone Encounter (Signed)
Can you call and schedule this

## 2013-01-01 NOTE — Telephone Encounter (Signed)
Pt has appt on 01-07-13

## 2013-01-07 ENCOUNTER — Ambulatory Visit (INDEPENDENT_AMBULATORY_CARE_PROVIDER_SITE_OTHER): Payer: BC Managed Care – PPO | Admitting: Family Medicine

## 2013-01-07 ENCOUNTER — Encounter: Payer: Self-pay | Admitting: Family Medicine

## 2013-01-07 DIAGNOSIS — I10 Essential (primary) hypertension: Secondary | ICD-10-CM

## 2013-01-07 DIAGNOSIS — E119 Type 2 diabetes mellitus without complications: Secondary | ICD-10-CM

## 2013-01-07 MED ORDER — PHENTERMINE-TOPIRAMATE ER 7.5-46 MG PO CP24: 1.0000 | ORAL_CAPSULE | Freq: Every day | ORAL | Status: DC

## 2013-01-07 MED ORDER — PHENTERMINE-TOPIRAMATE ER 3.75-23 MG PO CP24: 1.0000 | ORAL_CAPSULE | Freq: Every day | ORAL | Status: DC

## 2013-01-07 NOTE — Progress Notes (Signed)
  Subjective:    Patient ID: Tracy Anderson, female    DOB: 1961/05/17, 52 y.o.   MRN: [id number]  HPI Here asking for help with weight loss. She has spoken to a PharmD at her job, and they recommended she try Qsymia. She has done a little research on this and she does want to try it. Her last fasting glucose was 150.    Review of Systems  Constitutional: Negative.   Respiratory: Negative.   Cardiovascular: Negative.        Objective:   Physical Exam  Constitutional: She appears well-developed and well-nourished.  Cardiovascular: Normal rate, regular rhythm, normal heart sounds and intact distal pulses.   Pulmonary/Chest: Effort normal and breath sounds normal.          Assessment & Plan:  Try Qsymia per the start up protocol. Recheck in 6 weeks

## 2013-01-24 ENCOUNTER — Telehealth: Payer: Self-pay | Admitting: Family Medicine

## 2013-01-24 NOTE — Telephone Encounter (Signed)
Cancel the Qsymia and call in Phentermine 37.5 mg daily for 6 months

## 2013-01-24 NOTE — Telephone Encounter (Signed)
Pt states she has not started the Phentermine-Topiramate [facility]) 7.5-46 MG CP24 due to the cost.  She is cancelling follow up appt scheduled on 02/11/13.  Please advise if there is an alternative medication patient can take that is more affordable.

## 2013-01-27 MED ORDER — PHENTERMINE HCL 37.5 MG PO CAPS: 37.5000 mg | ORAL_CAPSULE | ORAL | Status: DC

## 2013-01-27 NOTE — Telephone Encounter (Signed)
I called in new script and left a voice message for pt.

## 2013-02-11 ENCOUNTER — Ambulatory Visit: Payer: BC Managed Care – PPO | Admitting: Family Medicine

## 2013-02-28 ENCOUNTER — Telehealth: Payer: Self-pay | Admitting: Family Medicine

## 2013-02-28 MED ORDER — CIPROFLOXACIN HCL 500 MG PO TABS: 500.0000 mg | ORAL_TABLET | Freq: Two times a day (BID) | ORAL | Status: DC

## 2013-02-28 NOTE — Telephone Encounter (Signed)
Okay. We will fax over an order for a UA and urine culture. Also call in Cipro 500 mg bid for 7 days

## 2013-02-28 NOTE — Telephone Encounter (Signed)
I faxed over order and sent script e-scribe

## 2013-02-28 NOTE — Telephone Encounter (Signed)
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  If possible would like Dr. Clent Ridges to start her on antibiotics.  RN Note:  Bladder discomfort is rate 3/10 and when goes to urinate 7/10.  Patient thinks she is catching this early.  Has been wearing a pad due to dribbling urine while coughing-feels pad may have set her up for a bladder infection.  RN attempted to schedule an appointment for evaluation.  Patient declined due to being the person that is designated to work to [employer] today. She is requesting a fax be sent to her attention at [facility]-Fax number is [phone] for Urinalysis and Culture with Sensitivity if it is needed.  If possible she would like to also start on an antibiotic.  If office is unable to do this, she will try to flush kidneys this weekend and plan to come in on Monday 03/03/13.  Allergic to Clarithromycin-last office visit 03/21.  Drug store of choice listed as [facility] on [address].  Symptoms  Reason For Call & Symptoms: Bladder infection.  Is having pressure, some bleeding; Is having some frequency, urgency and hesitancy at times.  Denies change in color or smell but it is cloudy.  Has had a cold for the last couple week.  Reviewed Health History In EMR: Yes  Reviewed Medications In EMR: Yes  Reviewed Allergies In EMR: Yes  Reviewed Surgeries / Procedures: Yes  Date of Onset of Symptoms: 02/28/2013 OB / GYN:  LMP: Unknown  Guideline(s) Used:  Urination Pain - Female  Disposition Per Guideline:   See Today in Office  Reason For Disposition  Reached:   Age > 50 years  Advice Given:  Fluids:   Drink extra fluids. Drink 8-10 glasses of liquids a day (Reason: to produce a dilute, non-irritating urine).  Warm Saline SITZ Baths to Reduce Pain:  Sit in a warm saline bath for 20 minutes to cleanse the area and to reduce pain. Add 2 oz. of table salt or baking soda to a tub of water.  Call Back If:  You become worse.  Cranberry Juice:   Some people think that drinking cranberry juice may help in fighting urinary tract infections. However, there is no good research that has ever proved this.  Dosage 100% Cranberry Juice: 1 oz (30 ml) twice a day.  RN Overrode Recommendation:  Follow Up With Office Later  Patient is requesting care, but not able to come in until Monday if needed.

## 2013-03-18 ENCOUNTER — Other Ambulatory Visit: Payer: Self-pay

## 2013-03-18 DIAGNOSIS — Z1231 Encounter for screening mammogram for malignant neoplasm of breast: Secondary | ICD-10-CM

## 2013-04-03 ENCOUNTER — Telehealth: Payer: Self-pay | Admitting: Family Medicine

## 2013-04-03 MED ORDER — GLUCOSE BLOOD VI STRP: ORAL_STRIP | Status: DC

## 2013-04-03 NOTE — Telephone Encounter (Signed)
I sent script e-scribe. 

## 2013-04-03 NOTE — Telephone Encounter (Signed)
PT called to request a new RX for her glucose blood (ACCU-CHEK COMPACT STRIPS) test strips. She would like them sent to the [facility] [address]. Please assist.

## 2013-04-24 ENCOUNTER — Ambulatory Visit: Payer: BC Managed Care – PPO

## 2013-04-29 ENCOUNTER — Telehealth: Payer: Self-pay | Admitting: Family Medicine

## 2013-04-29 NOTE — Telephone Encounter (Signed)
I left voice message for pt to return my call and let me know which machine she wants to try and where to send in script?

## 2013-04-29 NOTE — Telephone Encounter (Signed)
Request for new machine and strips for either Contour or One Touch. Pt's insurance will not cover the Accu Check anymore. Please call pt and go over this.

## 2013-04-30 NOTE — Telephone Encounter (Signed)
I spoke with pt and she will call back when she decides what machine she is going to use.

## 2013-05-01 ENCOUNTER — Telehealth: Payer: Self-pay | Admitting: Family Medicine

## 2013-05-01 MED ORDER — GLUCOSE BLOOD VI STRP: ORAL_STRIP | Status: DC

## 2013-05-01 NOTE — Telephone Encounter (Signed)
I spoke with pt and sent script e-scribe. 

## 2013-05-01 NOTE — Telephone Encounter (Signed)
Pt needs strips for Freestyle ( freedom light ), send to [facility] on [address].

## 2013-05-13 ENCOUNTER — Ambulatory Visit
Admission: RE | Admit: 2013-05-13 | Discharge: 2013-05-13 | Disposition: A | Discharge status: Home / Self Care | Payer: BC Managed Care – PPO | Source: Ambulatory Visit

## 2013-05-13 DIAGNOSIS — Z1231 Encounter for screening mammogram for malignant neoplasm of breast: Secondary | ICD-10-CM

## 2013-05-14 ENCOUNTER — Encounter: Payer: 59 | Admitting: Family Medicine

## 2013-05-15 ENCOUNTER — Telehealth: Payer: Self-pay | Admitting: Family Medicine

## 2013-05-15 NOTE — Telephone Encounter (Signed)
Pt needs her lab order for upcoming CPE faxed to her work so she an get them done there. Fax [phone]   ([employer]

## 2013-05-16 NOTE — Telephone Encounter (Signed)
I faxed over lab order request and left voice message for pt.

## 2013-05-21 ENCOUNTER — Telehealth: Payer: Self-pay | Admitting: Family Medicine

## 2013-05-21 NOTE — Telephone Encounter (Signed)
PT called and stated that when the lab orders where faxed to her, Dr. Clent Ridges forgot to include an A1c. She states that she is a diabetic and would like to have this done prior to her CPX on 7/27. You may fax an order for an A1c to [phone], attn- Altamease Oiler. Please assist.

## 2013-05-21 NOTE — Telephone Encounter (Signed)
Order for lab draw was faxed over to the below number.

## 2013-05-27 ENCOUNTER — Ambulatory Visit (INDEPENDENT_AMBULATORY_CARE_PROVIDER_SITE_OTHER): Payer: BC Managed Care – PPO | Admitting: Family Medicine

## 2013-05-27 ENCOUNTER — Encounter: Payer: Self-pay | Admitting: Family Medicine

## 2013-05-27 VITALS — BP 138/86 | HR 90 | Temp 98.0°F | Ht 67.5 in | Wt 188.0 lb

## 2013-05-27 DIAGNOSIS — Z0000 Encounter for general adult medical examination without abnormal findings: Secondary | ICD-10-CM

## 2013-05-27 MED ORDER — LISINOPRIL 10 MG PO TABS: 10.0000 mg | ORAL_TABLET | Freq: Every day | ORAL | Status: DC

## 2013-05-27 MED ORDER — METFORMIN HCL 1000 MG PO TABS: 500.0000 mg | ORAL_TABLET | Freq: Every day | ORAL | Status: DC

## 2013-05-27 NOTE — Progress Notes (Signed)
  Subjective:    Patient ID: Tracy Anderson, female    DOB: Mar 27, 1961, 52 y.o.   MRN: [id number]  HPI 52 yr old female for a cpx. She feels well. She is doing great with her diet and with taking Phentermine. She has lost 20 lbs in the past 4 months. Her recent labs show her LDL is down to 82, her TG are down to 171, and her A1c is down to 6.2.    Review of Systems  Constitutional: Negative.   HENT: Negative.   Eyes: Negative.   Respiratory: Negative.   Cardiovascular: Negative.   Gastrointestinal: Negative.   Genitourinary: Negative for dysuria, urgency, frequency, hematuria, flank pain, decreased urine volume, enuresis, difficulty urinating, pelvic pain and dyspareunia.  Musculoskeletal: Negative.   Skin: Negative.   Neurological: Negative.   Psychiatric/Behavioral: Negative.        Objective:   Physical Exam  Constitutional: She is oriented to person, place, and time. She appears well-developed and well-nourished. No distress.  HENT:  Head: Normocephalic and atraumatic.  Right Ear: External ear normal.  Left Ear: External ear normal.  Nose: Nose normal.  Mouth/Throat: Oropharynx is clear and moist. No oropharyngeal exudate.  Eyes: Conjunctivae and EOM are normal. Pupils are equal, round, and reactive to light. No scleral icterus.  Neck: Normal range of motion. Neck supple. No JVD present. No thyromegaly present.  Cardiovascular: Normal rate, regular rhythm, normal heart sounds and intact distal pulses.  Exam reveals no gallop and no friction rub.   No murmur heard. EKG normal   Pulmonary/Chest: Effort normal and breath sounds normal. No respiratory distress. She has no wheezes. She has no rales. She exhibits no tenderness.  Abdominal: Soft. Bowel sounds are normal. She exhibits no distension and no mass. There is no tenderness. There is no rebound and no guarding.  Musculoskeletal: Normal range of motion. She exhibits no edema and no tenderness.  Lymphadenopathy:   She has no cervical adenopathy.  Neurological: She is alert and oriented to person, place, and time. She has normal reflexes. No cranial nerve deficit. She exhibits normal muscle tone. Coordination normal.  Skin: Skin is warm and dry. No rash noted. No erythema.  Psychiatric: She has a normal mood and affect. Her behavior is normal. Judgment and thought content normal.          Assessment & Plan:  Well exam. We will remove the HCTZ from her Lisinopril and we will stop the Glipizide. Recheck in 90 days

## 2013-06-17 ENCOUNTER — Other Ambulatory Visit: Payer: Self-pay | Admitting: Family Medicine

## 2013-07-01 ENCOUNTER — Telehealth: Payer: Self-pay | Admitting: Family Medicine

## 2013-07-01 NOTE — Telephone Encounter (Signed)
Pt would like to have order to have A1C drawn at her work. Please fax order to [phone]

## 2013-07-02 NOTE — Telephone Encounter (Signed)
Ready to fax  

## 2013-07-03 NOTE — Telephone Encounter (Signed)
Order was faxed to below number.  

## 2013-07-28 ENCOUNTER — Telehealth: Payer: Self-pay | Admitting: Family Medicine

## 2013-07-28 NOTE — Telephone Encounter (Signed)
I spoke with pt  

## 2013-07-28 NOTE — Telephone Encounter (Signed)
Her recent A1c was down to 6.8 which is excellent. Tell her that she can stop taking all Metformin. Recheck an A1c in 90 days

## 2013-08-01 ENCOUNTER — Telehealth: Payer: Self-pay | Admitting: Family Medicine

## 2013-08-01 NOTE — Telephone Encounter (Signed)
No she needs to get this from her new doctor

## 2013-08-01 NOTE — Telephone Encounter (Signed)
Pt moved to [place] and needs new rx phentermine 37.5mg .

## 2013-08-04 MED ORDER — PHENTERMINE HCL 37.5 MG PO CAPS: 37.5000 mg | ORAL_CAPSULE | ORAL | Status: DC

## 2013-08-04 NOTE — Telephone Encounter (Signed)
We can call in a 90 day supply but NO MORE after that. This gives her time to find a new doctor. Call in #30 with 2 rf

## 2013-08-04 NOTE — Telephone Encounter (Signed)
Pt states that she does not have a new doctor yet, and will need Dr. Clent Ridges to renew this RX as soon as possible, as he is still her pcp. Please advise.

## 2013-08-04 NOTE — Telephone Encounter (Signed)
I called in script to [facility] in [place] and I spoke with pt.

## 2013-11-03 ENCOUNTER — Other Ambulatory Visit: Payer: Self-pay | Admitting: Family Medicine

## 2013-11-03 NOTE — Telephone Encounter (Signed)
Cal in #30 with 5 rf 

## 2014-08-06 ENCOUNTER — Encounter: Payer: Self-pay | Admitting: Gastroenterology

## 2015-12-31 ENCOUNTER — Telehealth: Payer: Self-pay | Admitting: Family Medicine

## 2015-12-31 NOTE — Telephone Encounter (Signed)
Pt works at [employer], and wants to know if she can get her labs done over there? If not, she would like to go to [place] due to her work schedule. Please advise.

## 2015-12-31 NOTE — Telephone Encounter (Signed)
Pt is not on schedule until May 2017 for CPE, is she wanting do get labs done now?

## 2016-01-04 NOTE — Telephone Encounter (Signed)
Pt has cpe in May. I guess she is just being pro-active.

## 2016-01-10 ENCOUNTER — Other Ambulatory Visit: Payer: Self-pay

## 2016-01-10 DIAGNOSIS — Z1231 Encounter for screening mammogram for malignant neoplasm of breast: Secondary | ICD-10-CM

## 2016-01-25 MED FILL — FARXIGA 10 MG TABLET: 10 | 90 days supply | Qty: 90 | Fill #0

## 2016-01-25 MED FILL — PHENTERMINE 37.5 MG TABLET: 37.5 | 90 days supply | Qty: 90 | Fill #0

## 2016-02-07 ENCOUNTER — Ambulatory Visit
Admission: RE | Admit: 2016-02-07 | Discharge: 2016-02-07 | Disposition: A | Discharge status: Home / Self Care | Payer: 59 | Source: Ambulatory Visit

## 2016-02-07 DIAGNOSIS — Z1231 Encounter for screening mammogram for malignant neoplasm of breast: Secondary | ICD-10-CM | POA: Diagnosis not present

## 2016-02-19 ENCOUNTER — Encounter: Payer: Self-pay | Admitting: Family Medicine

## 2016-02-19 ENCOUNTER — Ambulatory Visit (INDEPENDENT_AMBULATORY_CARE_PROVIDER_SITE_OTHER): Payer: 59 | Admitting: Family Medicine

## 2016-02-19 VITALS — BP 120/80 | HR 86 | Temp 97.9°F | Resp 20 | Ht 67.5 in | Wt 185.8 lb

## 2016-02-19 DIAGNOSIS — J209 Acute bronchitis, unspecified: Secondary | ICD-10-CM | POA: Diagnosis not present

## 2016-02-19 MED ORDER — HYDROCODONE-HOMATROPINE 5-1.5 MG/5ML PO SYRP: 5.0000 mL | ORAL_SOLUTION | ORAL | Status: DC | PRN

## 2016-02-19 MED ORDER — AZITHROMYCIN 250 MG PO TABS: ORAL_TABLET | ORAL | Status: DC

## 2016-02-19 MED ORDER — BECLOMETHASONE DIPROPIONATE 40 MCG/ACT IN AERS: 2.0000 | INHALATION_SPRAY | Freq: Two times a day (BID) | RESPIRATORY_TRACT | Status: DC

## 2016-02-19 NOTE — Progress Notes (Signed)
   Subjective:    Patient ID: Tracy Anderson, female    DOB: 13-Jan-1961, 55 y.o.   MRN: [id number]  HPI Here for 4 weeks of chest congestion and a dry cough. No wheezing. She notes that she had asthma as a teenager.    Review of Systems  Constitutional: Negative.   HENT: Positive for congestion. Negative for postnasal drip, sinus pressure and sore throat.   Eyes: Negative.   Respiratory: Positive for cough. Negative for chest tightness and shortness of breath.        Objective:   Physical Exam  Constitutional: She appears well-developed and well-nourished.  HENT:  Right Ear: External ear normal.  Left Ear: External ear normal.  Nose: Nose normal.  Mouth/Throat: Oropharynx is clear and moist.  Eyes: Conjunctivae are normal.  Neck: No thyromegaly present.  Cardiovascular: Normal rate, regular rhythm, normal heart sounds and intact distal pulses.   Pulmonary/Chest: Effort normal and breath sounds normal. No respiratory distress. She has no wheezes. She has no rales.  Lymphadenopathy:    She has no cervical adenopathy.          Assessment & Plan:  URI with a possible component of asthma. Treat with a Zpack and a steroid inhaler.

## 2016-02-21 MED FILL — QVAR 40 MCG ORAL INHALER: 40 | 30 days supply | Qty: 9 | Fill #0

## 2016-02-21 MED FILL — HYDROMET SYRUP: 5-1.5 | 8 days supply | Qty: 240 | Fill #0

## 2016-02-21 MED FILL — AZITHROMYCIN 250 MG TABLET: 250 | 5 days supply | Qty: 6 | Fill #0

## 2016-03-16 NOTE — Telephone Encounter (Signed)
Pt has decided she prefers to go to [place] due [facility]  Will you put those orders in? Pt has cpe 5/22

## 2016-03-17 ENCOUNTER — Other Ambulatory Visit: Payer: Self-pay | Admitting: Family Medicine

## 2016-03-17 DIAGNOSIS — Z0000 Encounter for general adult medical examination without abnormal findings: Secondary | ICD-10-CM

## 2016-03-17 NOTE — Telephone Encounter (Signed)
Okay per Dr. Sarajane Jews to order CPE labs and A1c for [place] location.

## 2016-03-17 NOTE — Telephone Encounter (Signed)
I put in future lab order and left a voice message for pt with this information.

## 2016-03-21 ENCOUNTER — Other Ambulatory Visit (INDEPENDENT_AMBULATORY_CARE_PROVIDER_SITE_OTHER): Payer: 59

## 2016-03-21 DIAGNOSIS — R7989 Other specified abnormal findings of blood chemistry: Secondary | ICD-10-CM

## 2016-03-21 DIAGNOSIS — Z0000 Encounter for general adult medical examination without abnormal findings: Secondary | ICD-10-CM

## 2016-03-21 LAB — LIPID PANEL
CHOL/HDL RATIO: 5
CHOLESTEROL: 190 mg/dL (ref 0–200)
HDL: 39 mg/dL — AB (ref >39.00)
NonHDL: 151.08
TRIGLYCERIDES: 202 mg/dL — AB (ref 0.0–149.0)
VLDL: 40.4 mg/dL — ABNORMAL HIGH (ref 0.0–40.0)

## 2016-03-21 LAB — URINALYSIS, ROUTINE W REFLEX MICROSCOPIC
Bilirubin Urine: NEGATIVE
Ketones, ur: NEGATIVE
Leukocytes, UA: NEGATIVE
NITRITE: NEGATIVE
SPECIFIC GRAVITY, URINE: 1.015 (ref 1.000–1.030)
Total Protein, Urine: NEGATIVE
Urine Glucose: >=1000 — AB
Urobilinogen, UA: 0.2 (ref 0.0–1.0)
pH: 5.5 (ref 5.0–8.0)

## 2016-03-21 LAB — HEPATIC FUNCTION PANEL
ALBUMIN: 4.6 g/dL (ref 3.5–5.2)
ALK PHOS: 83 U/L (ref 39–117)
ALT: 16 U/L (ref 0–35)
AST: 14 U/L (ref 0–37)
Bilirubin, Direct: 0.3 mg/dL (ref 0.0–0.3)
TOTAL PROTEIN: 6.9 g/dL (ref 6.0–8.3)
Total Bilirubin: 0.5 mg/dL (ref 0.2–1.2)

## 2016-03-21 LAB — BASIC METABOLIC PANEL
BUN: 19 mg/dL (ref 6–23)
CO2: 28 meq/L (ref 19–32)
Calcium: 9.5 mg/dL (ref 8.4–10.5)
Chloride: 101 mEq/L (ref 96–112)
Creatinine, Ser: 0.85 mg/dL (ref 0.40–1.20)
GFR: 73.27 mL/min (ref >60.00)
GLUCOSE: 162 mg/dL — AB (ref 70–99)
POTASSIUM: 4.5 meq/L (ref 3.5–5.1)
Sodium: 138 mEq/L (ref 135–145)

## 2016-03-21 LAB — CBC WITH DIFFERENTIAL/PLATELET
BASOS ABS: 0 10*3/uL (ref 0.0–0.1)
BASOS PCT: 0.6 % (ref 0.0–3.0)
EOS ABS: 0.1 10*3/uL (ref 0.0–0.7)
Eosinophils Relative: 2.1 % (ref 0.0–5.0)
HCT: 37.8 % (ref 36.0–46.0)
HEMOGLOBIN: 12.8 g/dL (ref 12.0–15.0)
Lymphocytes Relative: 32.1 % (ref 12.0–46.0)
Lymphs Abs: 2 10*3/uL (ref 0.7–4.0)
MCHC: 33.8 g/dL (ref 30.0–36.0)
MCV: 85.3 fl (ref 78.0–100.0)
MONO ABS: 0.4 10*3/uL (ref 0.1–1.0)
Monocytes Relative: 6.5 % (ref 3.0–12.0)
Neutro Abs: 3.6 10*3/uL (ref 1.4–7.7)
Neutrophils Relative %: 58.7 % (ref 43.0–77.0)
Platelets: 247 10*3/uL (ref 150.0–400.0)
RBC: 4.43 Mil/uL (ref 3.87–5.11)
RDW: 12.9 % (ref 11.5–15.5)
WBC: 6.2 10*3/uL (ref 4.0–10.5)

## 2016-03-21 LAB — TSH: TSH: 1.77 u[IU]/mL (ref 0.35–4.50)

## 2016-03-21 LAB — HEMOGLOBIN A1C: HEMOGLOBIN A1C: 8.1 % — AB (ref 4.6–6.5)

## 2016-03-21 LAB — LDL CHOLESTEROL, DIRECT: LDL DIRECT: 95 mg/dL

## 2016-03-27 ENCOUNTER — Encounter: Payer: Self-pay | Admitting: Family Medicine

## 2016-03-27 ENCOUNTER — Ambulatory Visit (INDEPENDENT_AMBULATORY_CARE_PROVIDER_SITE_OTHER): Payer: 59 | Admitting: Family Medicine

## 2016-03-27 VITALS — BP 123/68 | HR 69 | Temp 98.3°F | Ht 67.5 in | Wt 186.0 lb

## 2016-03-27 DIAGNOSIS — Z0000 Encounter for general adult medical examination without abnormal findings: Secondary | ICD-10-CM

## 2016-03-27 MED ORDER — PHENTERMINE HCL 37.5 MG PO CAPS: 37.5000 mg | ORAL_CAPSULE | ORAL | Status: DC

## 2016-03-27 MED ORDER — FARXIGA 10 MG PO TABS: 10.0000 mg | ORAL_TABLET | Freq: Every day | ORAL | Status: DC

## 2016-03-27 NOTE — Progress Notes (Signed)
Pre visit review using our clinic review tool, if applicable. No additional management support is needed unless otherwise documented below in the visit note. 

## 2016-03-27 NOTE — Progress Notes (Signed)
   Subjective:    Patient ID: Tracy Anderson, female    DOB: Jul 02, 1961, 55 y.o.   MRN: [id number]  HPI 55 yr old female for a well exam. She feels well except for a nagging dry cough which started about 6 months ago. She was treated for a bronchitis last month and this resolved. She is working out at [employer] 4 days a week and watching her diet. She had stopped taking [place] for about 3 months and as a result her glucoses have gone up. Her A1c last week was 8.1.    Review of Systems  Constitutional: Negative.   HENT: Negative.   Eyes: Negative.   Respiratory: Negative.   Cardiovascular: Negative.   Gastrointestinal: Negative.   Genitourinary: Negative for dysuria, urgency, frequency, hematuria, flank pain, decreased urine volume, enuresis, difficulty urinating, pelvic pain and dyspareunia.  Musculoskeletal: Negative.   Skin: Negative.   Neurological: Negative.   Psychiatric/Behavioral: Negative.        Objective:   Physical Exam  Constitutional: She is oriented to person, place, and time. She appears well-developed and well-nourished. No distress.  HENT:  Head: Normocephalic and atraumatic.  Right Ear: External ear normal.  Left Ear: External ear normal.  Nose: Nose normal.  Mouth/Throat: Oropharynx is clear and moist. No oropharyngeal exudate.  Eyes: Conjunctivae and EOM are normal. Pupils are equal, round, and reactive to light. No scleral icterus.  Neck: Normal range of motion. Neck supple. No JVD present. No thyromegaly present.  Cardiovascular: Normal rate, regular rhythm, normal heart sounds and intact distal pulses.  Exam reveals no gallop and no friction rub.   No murmur heard. EKG normal   Pulmonary/Chest: Effort normal and breath sounds normal. No respiratory distress. She has no wheezes. She has no rales. She exhibits no tenderness.  Abdominal: Soft. Bowel sounds are normal. She exhibits no distension and no mass. There is no tenderness. There is no rebound and  no guarding.  Musculoskeletal: Normal range of motion. She exhibits no edema or tenderness.  Lymphadenopathy:    She has no cervical adenopathy.  Neurological: She is alert and oriented to person, place, and time. She has normal reflexes. No cranial nerve deficit. She exhibits normal muscle tone. Coordination normal.  Skin: Skin is warm and dry. No rash noted. No erythema.  Psychiatric: She has a normal mood and affect. Her behavior is normal. Judgment and thought content normal.          Assessment & Plan:  Well exam. We discussed diet and exercise. Her cough is likely from the Lisinopril so we will stop this. Recheck BP in 3 months. Get back on Farxiga daily.  Laurey Morale, MD

## 2016-04-25 MED FILL — FARXIGA 10 MG TABLET: 10 | 90 days supply | Qty: 90 | Fill #0

## 2016-04-25 MED FILL — PHENTERMINE 37.5 MG TABLET: 37.5 | 90 days supply | Qty: 90 | Fill #0

## 2016-05-17 ENCOUNTER — Ambulatory Visit (INDEPENDENT_AMBULATORY_CARE_PROVIDER_SITE_OTHER): Payer: 59 | Admitting: Adult Health

## 2016-05-17 ENCOUNTER — Telehealth: Payer: Self-pay | Admitting: Adult Health

## 2016-05-17 ENCOUNTER — Encounter: Payer: Self-pay | Admitting: Adult Health

## 2016-05-17 VITALS — BP 170/80 | Temp 97.6°F | Ht 67.5 in | Wt 183.1 lb

## 2016-05-17 DIAGNOSIS — R1084 Generalized abdominal pain: Secondary | ICD-10-CM

## 2016-05-17 DIAGNOSIS — I10 Essential (primary) hypertension: Secondary | ICD-10-CM | POA: Diagnosis not present

## 2016-05-17 LAB — CBC
HCT: 42 % (ref 36.0–46.0)
Hemoglobin: 14 g/dL (ref 12.0–15.0)
MCHC: 33.3 g/dL (ref 30.0–36.0)
MCV: 86.8 fl (ref 78.0–100.0)
PLATELETS: 232 10*3/uL (ref 150.0–400.0)
RBC: 4.84 Mil/uL (ref 3.87–5.11)
RDW: 13.1 % (ref 11.5–15.5)
WBC: 10.8 10*3/uL — ABNORMAL HIGH (ref 4.0–10.5)

## 2016-05-17 LAB — POC URINALSYSI DIPSTICK (AUTOMATED)
BILIRUBIN UA: NEGATIVE
Leukocytes, UA: NEGATIVE
Nitrite, UA: NEGATIVE
Protein, UA: NEGATIVE
RBC UA: NEGATIVE
SPEC GRAV UA: 1.02
UROBILINOGEN UA: 1
pH, UA: 5.5

## 2016-05-17 LAB — HEPATIC FUNCTION PANEL
ALBUMIN: 4.6 g/dL (ref 3.5–5.2)
ALK PHOS: 130 U/L — AB (ref 39–117)
ALT: 379 U/L — ABNORMAL HIGH (ref 0–35)
AST: 494 U/L — AB (ref 0–37)
BILIRUBIN DIRECT: 0.9 mg/dL — AB (ref 0.0–0.3)
Total Bilirubin: 1.6 mg/dL — ABNORMAL HIGH (ref 0.2–1.2)
Total Protein: 7.5 g/dL (ref 6.0–8.3)

## 2016-05-17 LAB — BASIC METABOLIC PANEL
BUN: 17 mg/dL (ref 6–23)
CALCIUM: 10.5 mg/dL (ref 8.4–10.5)
CO2: 31 meq/L (ref 19–32)
CREATININE: 0.83 mg/dL (ref 0.40–1.20)
Chloride: 93 mEq/L — ABNORMAL LOW (ref 96–112)
GFR: 75.27 mL/min (ref >60.00)
GLUCOSE: 133 mg/dL — AB (ref 70–99)
Potassium: 4.3 mEq/L (ref 3.5–5.1)
Sodium: 143 mEq/L (ref 135–145)

## 2016-05-17 MED ORDER — LOSARTAN POTASSIUM 50 MG PO TABS: 50.0000 mg | ORAL_TABLET | Freq: Every day | ORAL | Status: DC

## 2016-05-17 MED ORDER — PANTOPRAZOLE SODIUM 40 MG PO TBEC: 40.0000 mg | DELAYED_RELEASE_TABLET | Freq: Every day | ORAL | Status: DC

## 2016-05-17 MED FILL — LOSARTAN POTASSIUM 50 MG TA: 50 | 30 days supply | Qty: 30 | Fill #0

## 2016-05-17 MED FILL — PANTOPRAZOLE SOD DR 40 MG T: 40 | 30 days supply | Qty: 30 | Fill #0

## 2016-05-17 NOTE — Progress Notes (Addendum)
Subjective:    Patient ID: Tracy Anderson, female    DOB: 10-01-1961, 55 y.o.   MRN: [id number]  HPI  Yesterday afternoon she had upper abdominal pain that is described as pressure. The pain went away for a few hours until after dinner, where the pain returned but was worse than prior. She took a few Tums and about 30 minutes later the pain resolved. That evening she was awoken from a sound sleep due to the pressure like pain. She took one of her husbands heart burn medication. She was able to go back to sleep. When she woke up this morning she had very little pain " felt like I had done a lot of sit ups."  Throughout the afternoon today she has had intermittent episodes of this pain, rates it up to 7/10. Last night it was 10/10. Currently the pain is rated as a 3-4.   The pain is in the epigastric area and radiates under both ribs and feels as though it radiates up into her throat  She denies relief with belching or passing gas. She had a normal BM's throughout the last 24 hours. Denies any vomiting but does have episodes of nausea   No discomfort up jaw line or down arms.   Review of Systems  Constitutional: Negative.   HENT: Negative.   Respiratory: Negative.   Cardiovascular: Negative.   Gastrointestinal: Positive for nausea and abdominal pain. Negative for vomiting, constipation, blood in stool, abdominal distention, anal bleeding and rectal pain.  Genitourinary: Negative.   Skin: Negative.   Psychiatric/Behavioral: Positive for sleep disturbance.  All other systems reviewed and are negative.  Past Medical History  Diagnosis Date  . Herpes genitalia   . Asthma   . Depression   . Diabetes mellitus type II   . Hyperlipidemia   . Hypertension   . Shingles 2012    right flank    Social History   Social History  . Marital Status: Married    Spouse Name: N/A  . Number of Children: N/A  . Years of Education: N/A   Occupational History  . Not on file.   Social  History Main Topics  . Smoking status: Former Smoker    Quit date: 05/05/1991  . Smokeless tobacco: Never Used  . Alcohol Use: 1.2 oz/week    2 Standard drinks or equivalent per week     Comment: occ  . Drug Use: No  . Sexual Activity: Not on file   Other Topics Concern  . Not on file   Social History Narrative    Past Surgical History  Procedure Laterality Date  . Tympanostomy tube placement    . Colonscopy  05/20/09    per Dr. Deatra Ina, hyperplstic polyps, repeat in 29 yers     Family History  Problem Relation Age of Onset  . Arthritis Other   . Cancer Other     cervical  . Depression Other   . Diabetes Other   . Ulcers Other     Allergies  Allergen Reactions  . Clarithromycin   . Lisinopril Cough    Current Outpatient Prescriptions on File Prior to Visit  Medication Sig Dispense Refill  . FARXIGA 10 MG TABS tablet Take 10 mg by mouth daily. 90 tablet 3  . phentermine 37.5 MG capsule Take 1 capsule (37.5 mg total) by mouth every morning. 90 capsule 1   No current facility-administered medications on file prior to visit.    BP 170/80 mmHg  Temp(Src) 97.6 F (36.4 C) (Oral)  Ht 5' 7.5" (1.715 m)  Wt 183 lb 1.6 oz (83.054 kg)  BMI 28.24 kg/m2       Objective:   Physical Exam  Constitutional: She is oriented to person, place, and time. She appears well-developed and well-nourished. No distress.  Cardiovascular: Normal rate, regular rhythm, normal heart sounds and intact distal pulses.  Exam reveals no gallop.   No murmur heard. Pulmonary/Chest: Breath sounds normal. No respiratory distress. She has no wheezes. She has no rales. She exhibits no tenderness.  Abdominal: Soft. Normal appearance and bowel sounds are normal. She exhibits no distension, no abdominal bruit and no mass. There is no splenomegaly or hepatomegaly. There is generalized tenderness. There is no rigidity, no rebound, no guarding and no CVA tenderness. A hernia is present.  Neurological: She  is alert and oriented to person, place, and time.  Skin: Skin is warm and dry. No rash noted. She is not diaphoretic. No erythema. No pallor.  Psychiatric: She has a normal mood and affect. Her behavior is normal. Judgment and thought content normal.  Nursing note and vitals reviewed.     Assessment & Plan:  1. Generalized abdominal pain - Appears more as GERD or peptic ulcer. Normoactive BS, doubt ileus or torsion. Can not rule out gallbladder - pantoprazole (PROTONIX) 40 MG tablet; Take 1 tablet (40 mg total) by mouth daily.  Dispense: 30 tablet; Refill: 3 - EKG 12-Lead - Sinus Rhythm. Nonspecific T- abnormality. Rate 64 - POCT Urinalysis Dipstick (Automated) - Basic metabolic panel - CBC - Hepatic function panel - If no improvement in the next two days then consider CT of abdomen 2. Essential hypertension  - losartan (COZAAR) 50 MG tablet; Take 1 tablet (50 mg total) by mouth daily.  Dispense: 30 tablet; Refill: [place]

## 2016-05-17 NOTE — Patient Instructions (Addendum)
It was great meeting you today!  I am going to get some blood work on you and send in a prescription for Protonix. You can start the protonix when you get it and then in the morning thereafter.   I have also sent in a prescription for Losartan, this is for your blood pressure. Take daily. Monitor BP at home and bring log with you to the next appointment.   I will follow up with you regarding your labs

## 2016-05-17 NOTE — Telephone Encounter (Signed)
Spoke to [employer] on the phone and informed her of her lab results. Her Alk Phos, AST and ALT are elevated.   Will get urgent US of gallbladder  Advised that if she starts vomiting or running high fever to go to the ER

## 2016-05-18 ENCOUNTER — Ambulatory Visit (HOSPITAL_COMMUNITY)
Admission: RE | Admit: 2016-05-18 | Discharge: 2016-05-18 | Disposition: A | Discharge status: Home / Self Care | Payer: 59 | Source: Ambulatory Visit | Attending: Adult Health | Admitting: Adult Health

## 2016-05-18 ENCOUNTER — Telehealth: Payer: Self-pay | Admitting: Adult Health

## 2016-05-18 DIAGNOSIS — R1084 Generalized abdominal pain: Secondary | ICD-10-CM | POA: Diagnosis not present

## 2016-05-18 DIAGNOSIS — R935 Abnormal findings on diagnostic imaging of other abdominal regions, including retroperitoneum: Secondary | ICD-10-CM | POA: Insufficient documentation

## 2016-05-18 DIAGNOSIS — K8020 Calculus of gallbladder without cholecystitis without obstruction: Secondary | ICD-10-CM | POA: Diagnosis not present

## 2016-05-18 DIAGNOSIS — K8000 Calculus of gallbladder with acute cholecystitis without obstruction: Secondary | ICD-10-CM

## 2016-05-18 NOTE — Telephone Encounter (Signed)
Spoke to [employer] on the phone.   Her [place] this morning showed  Cholelithiasis with minimal gallbladder wall thickening though no pericholecystic fluid or sonographic Percell Miller sign are identified ; if there is persistent clinical concern for acute cholecystitis, consider radionuclide hepatobiliary scintigraphy.  Gallbladder polyp 6 mm diameter, likely benign ; no further workup Required.  She seems to be feeling better this morning but would still like to be referred to gen surg for further evaluation. I believe this is reasonable.

## 2016-05-31 ENCOUNTER — Encounter: Payer: Self-pay | Admitting: General Surgery

## 2016-05-31 ENCOUNTER — Ambulatory Visit: Payer: Self-pay | Admitting: General Surgery

## 2016-05-31 DIAGNOSIS — K824 Cholesterolosis of gallbladder: Secondary | ICD-10-CM | POA: Diagnosis not present

## 2016-05-31 DIAGNOSIS — K8020 Calculus of gallbladder without cholecystitis without obstruction: Secondary | ICD-10-CM | POA: Diagnosis not present

## 2016-06-16 MED FILL — LOSARTAN POTASSIUM 50 MG TA: 50 | 30 days supply | Qty: 30 | Fill #1

## 2016-06-26 ENCOUNTER — Ambulatory Visit: Payer: 59 | Admitting: Family Medicine

## 2016-06-26 NOTE — Pre-Procedure Instructions (Signed)
Tracy Anderson  06/26/2016      [place], [place] - [address] [facility]. [address] [place] [address] Phone: [phone] Fax: [phone]    Your procedure is scheduled on August 30  Report to [facility] at 1030 A.M.  Call this number if you have problems the morning of surgery:  [phone]   Remember:  Do not eat food or drink liquids after midnight.  Take these medicines the morning of surgery with A SIP OF WATER Pantoprazole (protonix)  7 days prior to surgery STOP taking any Aspirin, Aleve, Naproxen, Ibuprofen, Motrin, Advil, Goody's, BC's, all herbal medications, fish oil, and all vitamins (STOP taking phentermine)  How to Manage Your Diabetes Before and After Surgery  Why is it important to control my blood sugar before and after surgery? . Improving blood sugar levels before and after surgery helps healing and can limit problems. . A way of improving blood sugar control is eating a healthy diet by: o  Eating less sugar and carbohydrates o  Increasing activity/exercise o  Talking with your doctor about reaching your blood sugar goals . High blood sugars (greater than 180 mg/dL) can raise your risk of infections and slow your recovery, so you will need to focus on controlling your diabetes during the weeks before surgery. . Make sure that the doctor who takes care of your diabetes knows about your planned surgery including the date and location.  How do I manage my blood sugar before surgery? . Check your blood sugar at least 4 times a day, starting 2 days before surgery, to make sure that the level is not too high or low. o Check your blood sugar the morning of your surgery when you wake up and every 2 hours until you get to the Short Stay unit. . If your blood sugar is less than 70 mg/dL, you will need to treat for low blood sugar: o Do not take insulin. o Treat a low blood sugar (less  than 70 mg/dL) with  cup of clear juice (cranberry or apple), 4 glucose tablets, OR glucose gel. o Recheck blood sugar in 15 minutes after treatment (to make sure it is greater than 70 mg/dL). If your blood sugar is not greater than 70 mg/dL on recheck, call [phone] for further instructions. . Report your blood sugar to the short stay nurse when you get to Short Stay.  . If you are admitted to the hospital after surgery: o Your blood sugar will be checked by the staff and you will probably be given insulin after surgery (instead of oral diabetes medicines) to make sure you have good blood sugar levels. o The goal for blood sugar control after surgery is 80-180 mg/dL.    WHAT DO I DO ABOUT MY DIABETES MEDICATION?   Marland Kitchen Do not take oral diabetes medicines (pills) the morning of surgery. DO NOT NOT FARXIGA    Do not wear jewelry, make-up or nail polish.  Do not wear lotions, powders, or perfumes.  You may NOT wear deoderant.  Do not shave 48 hours prior to surgery.    Do not bring valuables to the hospital.  [facility] is not responsible for any belongings or valuables.  Contacts, dentures or bridgework may not be worn into surgery.  Leave your suitcase in the car.  After surgery it may be brought to your room.  For patients admitted to the hospital, discharge time will  be determined by your treatment team.  Patients discharged the day of surgery will not be allowed to drive home.    Special instructions:   [place]- Preparing For Surgery  Before surgery, you can play an important role. Because skin is not sterile, your skin needs to be as free of germs as possible. You can reduce the number of germs on your skin by washing with CHG (chlorahexidine gluconate) Soap before surgery.  CHG is an antiseptic cleaner which kills germs and bonds with the skin to continue killing germs even after washing.  Please do not use if you have an allergy to CHG or antibacterial soaps. If your  skin becomes reddened/irritated stop using the CHG.  Do not shave (including legs and underarms) for at least 48 hours prior to first CHG shower. It is OK to shave your face.  Please follow these instructions carefully.   1. Shower the NIGHT BEFORE SURGERY and the MORNING OF SURGERY with CHG.   2. If you chose to wash your hair, wash your hair first as usual with your normal shampoo.  3. After you shampoo, rinse your hair and body thoroughly to remove the shampoo.  4. Use CHG as you would any other liquid soap. You can apply CHG directly to the skin and wash gently with a scrungie or a clean washcloth.   5. Apply the CHG Soap to your body ONLY FROM THE NECK DOWN.  Do not use on open wounds or open sores. Avoid contact with your eyes, ears, mouth and genitals (private parts). Wash genitals (private parts) with your normal soap.  6. Wash thoroughly, paying special attention to the area where your surgery will be performed.  7. Thoroughly rinse your body with warm water from the neck down.  8. DO NOT shower/wash with your normal soap after using and rinsing off the CHG Soap.  9. Pat yourself dry with a CLEAN TOWEL.   10. Wear CLEAN PAJAMAS   11. Place CLEAN SHEETS on your bed the night of your first shower and DO NOT SLEEP WITH PETS.    Day of Surgery: Do not apply any deodorants/lotions. Please wear clean clothes to the hospital/surgery center.      Please read over the following fact sheets that you were given. Pain Booklet, Coughing and Deep Breathing and Surgical Site Infection Prevention

## 2016-06-27 ENCOUNTER — Encounter (HOSPITAL_COMMUNITY)
Admission: RE | Admit: 2016-06-27 | Discharge: 2016-06-27 | Disposition: A | Discharge status: Home / Self Care | Payer: 59 | Source: Ambulatory Visit | Attending: General Surgery | Admitting: General Surgery

## 2016-06-27 ENCOUNTER — Encounter (HOSPITAL_COMMUNITY): Payer: Self-pay

## 2016-06-27 DIAGNOSIS — I10 Essential (primary) hypertension: Secondary | ICD-10-CM | POA: Insufficient documentation

## 2016-06-27 DIAGNOSIS — Z01812 Encounter for preprocedural laboratory examination: Secondary | ICD-10-CM | POA: Diagnosis not present

## 2016-06-27 DIAGNOSIS — Z79899 Other long term (current) drug therapy: Secondary | ICD-10-CM | POA: Insufficient documentation

## 2016-06-27 DIAGNOSIS — E119 Type 2 diabetes mellitus without complications: Secondary | ICD-10-CM | POA: Insufficient documentation

## 2016-06-27 DIAGNOSIS — Z01818 Encounter for other preprocedural examination: Secondary | ICD-10-CM | POA: Diagnosis not present

## 2016-06-27 DIAGNOSIS — E785 Hyperlipidemia, unspecified: Secondary | ICD-10-CM | POA: Diagnosis not present

## 2016-06-27 DIAGNOSIS — Z87891 Personal history of nicotine dependence: Secondary | ICD-10-CM | POA: Insufficient documentation

## 2016-06-27 DIAGNOSIS — K8020 Calculus of gallbladder without cholecystitis without obstruction: Secondary | ICD-10-CM | POA: Diagnosis not present

## 2016-06-27 DIAGNOSIS — Z7984 Long term (current) use of oral hypoglycemic drugs: Secondary | ICD-10-CM | POA: Diagnosis not present

## 2016-06-27 DIAGNOSIS — J45909 Unspecified asthma, uncomplicated: Secondary | ICD-10-CM | POA: Diagnosis not present

## 2016-06-27 HISTORY — DX: Unspecified osteoarthritis, unspecified site: M19.90

## 2016-06-27 LAB — CBC WITH DIFFERENTIAL/PLATELET
BASOS PCT: 0 %
Basophils Absolute: 0 10*3/uL (ref 0.0–0.1)
EOS ABS: 0.1 10*3/uL (ref 0.0–0.7)
Eosinophils Relative: 1 %
HEMATOCRIT: 36.5 % (ref 36.0–46.0)
HEMOGLOBIN: 11.5 g/dL — AB (ref 12.0–15.0)
LYMPHS ABS: 1.7 10*3/uL (ref 0.7–4.0)
Lymphocytes Relative: 33 %
MCH: 28.2 pg (ref 26.0–34.0)
MCHC: 31.5 g/dL (ref 30.0–36.0)
MCV: 89.5 fL (ref 78.0–100.0)
Monocytes Absolute: 0.2 10*3/uL (ref 0.1–1.0)
Monocytes Relative: 4 %
NEUTROS ABS: 3.2 10*3/uL (ref 1.7–7.7)
NEUTROS PCT: 62 %
Platelets: 225 10*3/uL (ref 150–400)
RBC: 4.08 MIL/uL (ref 3.87–5.11)
RDW: 12.3 % (ref 11.5–15.5)
WBC: 5.2 10*3/uL (ref 4.0–10.5)

## 2016-06-27 LAB — COMPREHENSIVE METABOLIC PANEL
ALBUMIN: 4.1 g/dL (ref 3.5–5.0)
ALK PHOS: 89 U/L (ref 38–126)
ALT: 18 U/L (ref 14–54)
AST: 16 U/L (ref 15–41)
Anion gap: 7 (ref 5–15)
BILIRUBIN TOTAL: 0.4 mg/dL (ref 0.3–1.2)
BUN: 13 mg/dL (ref 6–20)
CALCIUM: 9 mg/dL (ref 8.9–10.3)
CO2: 27 mmol/L (ref 22–32)
CREATININE: 0.76 mg/dL (ref 0.44–1.00)
Chloride: 104 mmol/L (ref 101–111)
GFR calc Af Amer: >60 mL/min (ref >60)
GFR calc non Af Amer: >60 mL/min (ref >60)
GLUCOSE: 163 mg/dL — AB (ref 65–99)
Potassium: 3.9 mmol/L (ref 3.5–5.1)
SODIUM: 138 mmol/L (ref 135–145)
Total Protein: 6.8 g/dL (ref 6.5–8.1)

## 2016-06-27 LAB — GLUCOSE, CAPILLARY: GLUCOSE-CAPILLARY: 169 mg/dL — AB (ref 65–99)

## 2016-06-27 NOTE — Progress Notes (Addendum)
PCP is Dr. Alysia Penna Denies ever seeing a cardiologist. Denies ever having a  Stress test, echo, Or card cath Denies having any chest pain Reports that she doesn't check her blood sugars, but when she did they were 160-220.

## 2016-06-28 LAB — HEMOGLOBIN A1C
Hgb A1c MFr Bld: 8.4 % — ABNORMAL HIGH (ref 4.8–5.6)
Mean Plasma Glucose: 194 mg/dL

## 2016-06-28 NOTE — Progress Notes (Signed)
Anesthesia Chart Review:  Pt is a 55 year old female scheduled for laparoscopic cholecystectomy with intraoperative cholangiogram on 07/05/2016 with Georganna Skeans, MD.   PMH includes:  HTN, DM, hyperlipidemia, asthma, Former smoker. BMI 27.5  Medications include: farxiga, losartan, protonix, phentermine. Pt to stop phentermine 7 days before surgery.   Preoperative labs reviewed.  HgbA1c 8.4, glucose 163  EKG 05/17/16: Sinus  Rhythm. Low voltage in precordial leads. LAD. Poor R wave progression. Nonspecific T-abnormality.   If no changes, I anticipate pt can proceed with surgery as scheduled.   Willeen Cass, FNP-BC [facility] Short Stay Surgical Center/Anesthesiology Phone: [phone] 06/28/2016 3:18 PM

## 2016-07-05 ENCOUNTER — Encounter (HOSPITAL_COMMUNITY): Payer: Self-pay | Admitting: *Deleted

## 2016-07-05 ENCOUNTER — Encounter (HOSPITAL_COMMUNITY)
Admission: RE | Disposition: A | Discharge status: Home / Self Care | Payer: Self-pay | Source: Ambulatory Visit | Attending: General Surgery

## 2016-07-05 ENCOUNTER — Ambulatory Visit (HOSPITAL_COMMUNITY): Payer: 59 | Admitting: Vascular Surgery

## 2016-07-05 ENCOUNTER — Ambulatory Visit (HOSPITAL_COMMUNITY): Payer: 59 | Admitting: Certified Registered Nurse Anesthetist

## 2016-07-05 ENCOUNTER — Ambulatory Visit (HOSPITAL_COMMUNITY)
Admission: RE | Admit: 2016-07-05 | Discharge: 2016-07-05 | Disposition: A | Discharge status: Home / Self Care | Payer: 59 | Source: Ambulatory Visit | Attending: General Surgery | Admitting: General Surgery

## 2016-07-05 DIAGNOSIS — M1990 Unspecified osteoarthritis, unspecified site: Secondary | ICD-10-CM | POA: Diagnosis not present

## 2016-07-05 DIAGNOSIS — Z87891 Personal history of nicotine dependence: Secondary | ICD-10-CM | POA: Diagnosis not present

## 2016-07-05 DIAGNOSIS — I10 Essential (primary) hypertension: Secondary | ICD-10-CM | POA: Diagnosis not present

## 2016-07-05 DIAGNOSIS — Z7984 Long term (current) use of oral hypoglycemic drugs: Secondary | ICD-10-CM | POA: Diagnosis not present

## 2016-07-05 DIAGNOSIS — K8020 Calculus of gallbladder without cholecystitis without obstruction: Secondary | ICD-10-CM | POA: Diagnosis not present

## 2016-07-05 DIAGNOSIS — E119 Type 2 diabetes mellitus without complications: Secondary | ICD-10-CM | POA: Insufficient documentation

## 2016-07-05 DIAGNOSIS — K824 Cholesterolosis of gallbladder: Secondary | ICD-10-CM | POA: Diagnosis not present

## 2016-07-05 DIAGNOSIS — K8010 Calculus of gallbladder with chronic cholecystitis without obstruction: Secondary | ICD-10-CM | POA: Insufficient documentation

## 2016-07-05 HISTORY — PX: CHOLECYSTECTOMY: SHX55

## 2016-07-05 LAB — GLUCOSE, CAPILLARY
GLUCOSE-CAPILLARY: 138 mg/dL — AB (ref 65–99)
Glucose-Capillary: 168 mg/dL — ABNORMAL HIGH (ref 65–99)

## 2016-07-05 SURGERY — LAPAROSCOPIC CHOLECYSTECTOMY WITH INTRAOPERATIVE CHOLANGIOGRAM
Anesthesia: General | Site: Abdomen

## 2016-07-05 MED ORDER — BUPIVACAINE-EPINEPHRINE (PF) 0.25% -1:200000 IJ SOLN
INTRAMUSCULAR | Status: AC
  Filled 2016-07-05: qty 30

## 2016-07-05 MED ORDER — OXYCODONE HCL 5 MG PO TABS
ORAL_TABLET | ORAL | Status: AC
  Filled 2016-07-05: qty 1

## 2016-07-05 MED ORDER — FENTANYL CITRATE (PF) 100 MCG/2ML IJ SOLN
INTRAMUSCULAR | Status: AC
  Filled 2016-07-05: qty 2

## 2016-07-05 MED ORDER — ONDANSETRON HCL 4 MG/2ML IJ SOLN
INTRAMUSCULAR | Status: AC
  Filled 2016-07-05: qty 2

## 2016-07-05 MED ORDER — IOPAMIDOL (ISOVUE-300) INJECTION 61%
INTRAVENOUS | Status: AC
  Filled 2016-07-05: qty 50

## 2016-07-05 MED ORDER — ROCURONIUM BROMIDE 100 MG/10ML IV SOLN
INTRAVENOUS | Status: DC | PRN
  Administered 2016-07-05: 50 mg via INTRAVENOUS

## 2016-07-05 MED ORDER — GLYCOPYRROLATE 0.2 MG/ML IJ SOLN
INTRAMUSCULAR | Status: DC | PRN
  Administered 2016-07-05: 0.2 mg via INTRAVENOUS

## 2016-07-05 MED ORDER — OXYCODONE HCL 5 MG PO TABS
5.0000 mg | ORAL_TABLET | Freq: Once | ORAL | Status: AC | PRN
  Administered 2016-07-05: 5 mg via ORAL

## 2016-07-05 MED ORDER — OXYCODONE HCL 5 MG/5ML PO SOLN: 5.0000 mg | Freq: Once | ORAL | Status: AC | PRN

## 2016-07-05 MED ORDER — MIDAZOLAM HCL 2 MG/2ML IJ SOLN
INTRAMUSCULAR | Status: AC
  Filled 2016-07-05: qty 2

## 2016-07-05 MED ORDER — ACETAMINOPHEN 160 MG/5ML PO SOLN
325.0000 mg | ORAL | Status: DC | PRN
Start: 2016-07-05 — End: 2016-07-05
  Filled 2016-07-05: qty 20.3

## 2016-07-05 MED ORDER — OXYCODONE HCL 5 MG PO TABS: 5.0000 mg | ORAL_TABLET | Freq: Four times a day (QID) | ORAL | 0 refills | Status: DC | PRN

## 2016-07-05 MED ORDER — 0.9 % SODIUM CHLORIDE (POUR BTL) OPTIME
TOPICAL | Status: DC | PRN
  Administered 2016-07-05: 1000 mL

## 2016-07-05 MED ORDER — NEOSTIGMINE METHYLSULFATE 5 MG/5ML IV SOSY
PREFILLED_SYRINGE | INTRAVENOUS | Status: AC
  Filled 2016-07-05: qty 5

## 2016-07-05 MED ORDER — SODIUM CHLORIDE 0.9 % IR SOLN
Status: DC | PRN
  Administered 2016-07-05: 1000 mL

## 2016-07-05 MED ORDER — FENTANYL CITRATE (PF) 100 MCG/2ML IJ SOLN
INTRAMUSCULAR | Status: AC
  Administered 2016-07-05: 25 ug via INTRAVENOUS
  Filled 2016-07-05: qty 2

## 2016-07-05 MED ORDER — LIDOCAINE HCL (CARDIAC) 20 MG/ML IV SOLN
INTRAVENOUS | Status: DC | PRN
  Administered 2016-07-05: 60 mg via INTRAVENOUS

## 2016-07-05 MED ORDER — ONDANSETRON HCL 4 MG/2ML IJ SOLN
INTRAMUSCULAR | Status: DC | PRN
  Administered 2016-07-05: 4 mg via INTRAVENOUS

## 2016-07-05 MED ORDER — FENTANYL CITRATE (PF) 100 MCG/2ML IJ SOLN
INTRAMUSCULAR | Status: DC | PRN
Start: 2016-07-05 — End: 2016-07-05
  Administered 2016-07-05: 100 ug via INTRAVENOUS
  Administered 2016-07-05 (×2): 50 ug via INTRAVENOUS

## 2016-07-05 MED ORDER — MIDAZOLAM HCL 5 MG/5ML IJ SOLN
INTRAMUSCULAR | Status: DC | PRN
  Administered 2016-07-05: 2 mg via INTRAVENOUS

## 2016-07-05 MED ORDER — BUPIVACAINE-EPINEPHRINE 0.25% -1:200000 IJ SOLN
INTRAMUSCULAR | Status: DC | PRN
  Administered 2016-07-05: 17 mL

## 2016-07-05 MED ORDER — FENTANYL CITRATE (PF) 100 MCG/2ML IJ SOLN
25.0000 ug | INTRAMUSCULAR | Status: DC | PRN
  Administered 2016-07-05 (×5): 25 ug via INTRAVENOUS

## 2016-07-05 MED ORDER — LIDOCAINE 2% (20 MG/ML) 5 ML SYRINGE
INTRAMUSCULAR | Status: AC
  Filled 2016-07-05: qty 5

## 2016-07-05 MED ORDER — PROPOFOL 10 MG/ML IV BOLUS
INTRAVENOUS | Status: DC | PRN
  Administered 2016-07-05: 160 mg via INTRAVENOUS

## 2016-07-05 MED ORDER — LACTATED RINGERS IV SOLN
INTRAVENOUS | Status: DC | PRN
  Administered 2016-07-05: 13:00:00 via INTRAVENOUS

## 2016-07-05 MED ORDER — CEFAZOLIN SODIUM-DEXTROSE 2-4 GM/100ML-% IV SOLN
INTRAVENOUS | Status: AC
  Filled 2016-07-05: qty 100

## 2016-07-05 MED ORDER — GLYCOPYRROLATE 0.2 MG/ML IV SOSY
PREFILLED_SYRINGE | INTRAVENOUS | Status: AC
  Filled 2016-07-05: qty 3

## 2016-07-05 MED ORDER — CEFAZOLIN SODIUM-DEXTROSE 2-3 GM-% IV SOLR
INTRAVENOUS | Status: DC | PRN
  Administered 2016-07-05: 2 g via INTRAVENOUS

## 2016-07-05 MED ORDER — SUGAMMADEX SODIUM 500 MG/5ML IV SOLN
INTRAVENOUS | Status: DC | PRN
  Administered 2016-07-05: 328.4 mg via INTRAVENOUS

## 2016-07-05 MED ORDER — ACETAMINOPHEN 325 MG PO TABS: 325.0000 mg | ORAL_TABLET | ORAL | Status: DC | PRN

## 2016-07-05 MED ORDER — LACTATED RINGERS IV SOLN
INTRAVENOUS | Status: DC
  Administered 2016-07-05: 11:00:00 via INTRAVENOUS

## 2016-07-05 MED ORDER — ROCURONIUM BROMIDE 10 MG/ML (PF) SYRINGE
PREFILLED_SYRINGE | INTRAVENOUS | Status: AC
  Filled 2016-07-05: qty 10

## 2016-07-05 MED ORDER — PROPOFOL 10 MG/ML IV BOLUS
INTRAVENOUS | Status: AC
  Filled 2016-07-05: qty 40

## 2016-07-05 MED FILL — oxyCODONE HCL 5 MG TABS: 5 | 4 days supply | Qty: 30 | Fill #0

## 2016-07-05 SURGICAL SUPPLY — 38 items
APPLIER CLIP 5 13 M/L LIGAMAX5 (MISCELLANEOUS) ×2
CANISTER SUCTION 2500CC (MISCELLANEOUS) ×2 IMPLANT
CHLORAPREP W/TINT 26ML (MISCELLANEOUS) ×2 IMPLANT
CLIP APPLIE 5 13 M/L LIGAMAX5 (MISCELLANEOUS) ×1 IMPLANT
COVER MAYO STAND STRL (DRAPES) ×2 IMPLANT
COVER SURGICAL LIGHT HANDLE (MISCELLANEOUS) ×2 IMPLANT
DRAPE C-ARM 42X72 X-RAY (DRAPES) ×2 IMPLANT
ELECT REM PT RETURN 9FT ADLT (ELECTROSURGICAL) ×2
ELECTRODE REM PT RTRN 9FT ADLT (ELECTROSURGICAL) ×1 IMPLANT
GLOVE BIO SURGEON STRL SZ8 (GLOVE) ×2 IMPLANT
GLOVE BIOGEL PI IND STRL 8 (GLOVE) ×1 IMPLANT
GLOVE BIOGEL PI INDICATOR 8 (GLOVE) ×1
GOWN STRL REUS W/ TWL LRG LVL3 (GOWN DISPOSABLE) ×2 IMPLANT
GOWN STRL REUS W/ TWL XL LVL3 (GOWN DISPOSABLE) ×1 IMPLANT
GOWN STRL REUS W/TWL LRG LVL3 (GOWN DISPOSABLE) ×2
GOWN STRL REUS W/TWL XL LVL3 (GOWN DISPOSABLE) ×1
KIT BASIN OR (CUSTOM PROCEDURE TRAY) ×2 IMPLANT
KIT ROOM TURNOVER OR (KITS) ×2 IMPLANT
L-HOOK LAP DISP 36CM (ELECTROSURGICAL) ×2
LHOOK LAP DISP 36CM (ELECTROSURGICAL) ×1 IMPLANT
LIQUID BAND (GAUZE/BANDAGES/DRESSINGS) ×2 IMPLANT
NEEDLE 22X1 1/2 (OR ONLY) (NEEDLE) ×2 IMPLANT
NS IRRIG 1000ML POUR BTL (IV SOLUTION) ×2 IMPLANT
PAD ARMBOARD 7.5X6 YLW CONV (MISCELLANEOUS) ×2 IMPLANT
PENCIL BUTTON HOLSTER BLD 10FT (ELECTRODE) ×2 IMPLANT
POUCH RETRIEVAL ECOSAC 10 (ENDOMECHANICALS) ×1 IMPLANT
POUCH RETRIEVAL ECOSAC 10MM (ENDOMECHANICALS) ×1
SCISSORS LAP 5X35 DISP (ENDOMECHANICALS) ×2 IMPLANT
SET CHOLANGIOGRAPH 5 50 .035 (SET/KITS/TRAYS/PACK) IMPLANT
SET IRRIG TUBING LAPAROSCOPIC (IRRIGATION / IRRIGATOR) ×2 IMPLANT
SLEEVE ENDOPATH XCEL 5M (ENDOMECHANICALS) ×4 IMPLANT
SPECIMEN JAR SMALL (MISCELLANEOUS) ×2 IMPLANT
SUT VIC AB 4-0 PS2 27 (SUTURE) ×2 IMPLANT
TOWEL OR 17X24 6PK STRL BLUE (TOWEL DISPOSABLE) ×2 IMPLANT
TRAY LAPAROSCOPIC MC (CUSTOM PROCEDURE TRAY) ×2 IMPLANT
TROCAR XCEL BLUNT TIP 100MML (ENDOMECHANICALS) ×2 IMPLANT
TROCAR XCEL NON-BLD 5MMX100MML (ENDOMECHANICALS) ×2 IMPLANT
TUBING INSUFFLATION (TUBING) ×2 IMPLANT

## 2016-07-05 NOTE — Interval H&P Note (Signed)
History and Physical Interval Note:  07/05/2016 12:05 PM  Tracy Anderson  has presented today for surgery, with the diagnosis of Symptomatic Cholelithiasis, Gall Bladder Polyp  The various methods of treatment have been discussed with the patient and family. After consideration of risks, benefits and other options for treatment, the patient has consented to  Procedure(s): LAPAROSCOPIC CHOLECYSTECTOMY WITH INTRAOPERATIVE CHOLANGIOGRAM (N/A) as a surgical intervention .  The patient's history has been reviewed, patient examined, no change in status, stable for surgery.  I have reviewed the patient's chart and labs.  Questions were answered to the patient's satisfaction.     Montie Gelardi E

## 2016-07-05 NOTE — H&P (Signed)
Tracy Anderson 05/31/2016 10:06 AM Location: [facility] Patient #: [id number] DOB: [birth year] Undefined / Language: Tracy Anderson / Race: White Female   History of Present Illness Tracy Neri E. Grandville Silos MD; 05/31/2016 10:23 AM) The patient is a 55 year old female who presents for evaluation of gallbladder disease. I was asked to see [name] in consultation by Sallee Provencal, [facility] regarding gallstones and right upper quadrant abdominal pain. Completely initially had an attack which started with deep back pain and transition to epigastric abdominal pain earlier this month. She was evaluated at her primary care office. Laboratory studies revealed transaminitis and ultrasound was done on July 13 showing multiple gallstones and a 6 mm gallbladder polyp. Since then she has been avoiding fatty foods and she has not had any further attacks.   Other Problems Elbert Ewings, CMA; 05/31/2016 10:06 AM) Arthritis Asthma Cholelithiasis Diabetes Mellitus High blood pressure  Past Surgical History Elbert Ewings, CMA; 05/31/2016 10:06 AM) Colon Polyp Removal - Colonoscopy  Diagnostic Studies History Elbert Ewings, CMA; 05/31/2016 10:06 AM) Colonoscopy 5-10 years ago Mammogram within last year Pap Smear 1-5 years ago  Allergies Elbert Ewings, CMA; 05/31/2016 10:07 AM) Clarithromycin *CHEMICALS* Lisinopril *CHEMICALS*  Medication History Elbert Ewings, CMA; 05/31/2016 10:08 AM) Phentermine HCl (37.5MG  Tablet, Oral) Active. Farxiga (10MG  Tablet, Oral) Active. Phentermine HCl (37.5MG  Capsule, Oral) Active. Losartan Potassium (50MG  Tablet, Oral) Active. Medications Reconciled  Social History Elbert Ewings, [place]; 05/31/2016 10:06 AM) Alcohol use Moderate alcohol use. Caffeine use Coffee, Tea. No drug use Tobacco use Former smoker.  Family History Elbert Ewings, [place]; 05/31/2016 10:06 AM) Alcohol Abuse Father, Mother. Arthritis Father. Breast Cancer Family Members In  General. Cerebrovascular Accident Mother. Colon Polyps Father, Sister. Depression Father, Mother, Sister. Diabetes Mellitus Father, Mother, Sister. Heart Disease Mother. Hypertension Father, Sister. Respiratory Condition Father.  Pregnancy / Birth History Elbert Ewings, CMA; 05/31/2016 10:06 AM) Age at menarche 9 years. Age of menopause 51-55 Contraceptive History Oral contraceptives. Gravida 1 Maternal age 1-25 Para 0    Review of Systems Elbert Ewings CMA; 05/31/2016 10:06 AM) General Not Present- Appetite Loss, Chills, Fatigue, Fever, Night Sweats, Weight Gain and Weight Loss. Skin Not Present- Change in Wart/Mole, Dryness, Hives, Jaundice, New Lesions, Non-Healing Wounds, Rash and Ulcer. HEENT Present- Seasonal Allergies and Wears glasses/contact lenses. Not Present- Earache, Hearing Loss, Hoarseness, Nose Bleed, Oral Ulcers, Ringing in the Ears, Sinus Pain, Sore Throat, Visual Disturbances and Yellow Eyes. Respiratory Not Present- Bloody sputum, Chronic Cough, Difficulty Breathing, Snoring and Wheezing. Breast Not Present- Breast Mass, Breast Pain, Nipple Discharge and Skin Changes. Cardiovascular Not Present- Chest Pain, Difficulty Breathing Lying Down, Leg Cramps, Palpitations, Rapid Heart Rate, Shortness of Breath and Swelling of Extremities. Gastrointestinal Present- Abdominal Pain. Not Present- Bloating, Bloody Stool, Change in Bowel Habits, Chronic diarrhea, Constipation, Difficulty Swallowing, Excessive gas, Gets full quickly at meals, Hemorrhoids, Indigestion, Nausea, Rectal Pain and Vomiting. Female Genitourinary Not Present- Frequency, Nocturia, Painful Urination, Pelvic Pain and Urgency. Musculoskeletal Present- Back Pain. Not Present- Joint Pain, Joint Stiffness, Muscle Pain, Muscle Weakness and Swelling of Extremities. Neurological Not Present- Decreased Memory, Fainting, Headaches, Numbness, Seizures, Tingling, Tremor, Trouble walking and  Weakness. Psychiatric Not Present- Anxiety, Bipolar, Change in Sleep Pattern, Depression, Fearful and Frequent crying. Endocrine Present- Hot flashes. Not Present- Cold Intolerance, Excessive Hunger, Hair Changes, Heat Intolerance and New Diabetes. Hematology Not Present- Blood Thinners, Easy Bruising, Excessive bleeding, Gland problems, HIV and Persistent Infections.  Vitals Elbert Ewings CMA; 05/31/2016 10:08 AM) 05/31/2016 10:08 AM Weight: 179.8 lb Height: 68in  Body Surface Area: 1.95 m Body Mass Index: 27.34 kg/m  Temp.: 97.5F(Temporal)  Pulse: 70 (Regular)  BP: 128/78 (Sitting, Left Arm, Standard)       Physical Exam (Milt Coye E. Grandville Silos MD; 05/31/2016 10:23 AM) General Mental Status-Alert. General Appearance-Consistent with stated age. Hydration-Well hydrated. Voice-Normal.  Head and Neck Head-normocephalic, atraumatic with no lesions or palpable masses. Trachea-midline. Thyroid Gland Characteristics - normal size and consistency.  Eye Eyeball - Bilateral-Extraocular movements intact. Sclera/Conjunctiva - Bilateral-No scleral icterus.  Chest and Lung Exam Chest and lung exam reveals -quiet, even and easy respiratory effort with no use of accessory muscles and on auscultation, normal breath sounds, no adventitious sounds and normal vocal resonance. Inspection Chest Wall - Normal. Back - normal.  Cardiovascular Cardiovascular examination reveals -normal heart sounds, regular rate and rhythm with no murmurs and normal pedal pulses bilaterally.  Abdomen Inspection Inspection of the abdomen reveals - No Hernias. Palpation/Percussion Palpation and Percussion of the abdomen reveal - Soft, Non Tender, No Rebound tenderness, No Rigidity (guarding) and No hepatosplenomegaly. Auscultation Auscultation of the abdomen reveals - Bowel sounds normal.  Neurologic Neurologic evaluation reveals -alert and oriented x 3 with no impairment of recent  or remote memory. Mental Status-Normal.  Musculoskeletal Global Assessment -Note: no gross deformities.  Normal Exam - Left-Upper Extremity Strength Normal and Lower Extremity Strength Normal. Normal Exam - Right-Upper Extremity Strength Normal and Lower Extremity Strength Normal.  Lymphatic Head & Neck  General Head & Neck Lymphatics: Bilateral - Description - Normal. Axillary  General Axillary Region: Bilateral - Description - Normal. Tenderness - Non Tender. Femoral & Inguinal  Generalized Femoral & Inguinal Lymphatics: Bilateral - Description - No Generalized lymphadenopathy.    Assessment & Plan Tracy Neri E. Grandville Silos MD; 05/31/2016 10:26 AM) SYMPTOMATIC CHOLELITHIASIS (K80.20) Impression: In light of her ultrasound findings and elevated transaminases, she may have passed a common duct stone during this attack. She's been feeling much better and has no jaundice at this time. I have offered laparoscopic cholecystectomy with intraoperative cholangiogram. Procedure, risks, and benefits were discussed in detail with her including the expected postoperative course. She has a difficult work schedule. She will check with her supervisor and I will have my scheduler contact her in a few days. Georganna Skeans, MD, MPH, FACS Trauma: [phone] General Surgery: [phone]

## 2016-07-05 NOTE — Anesthesia Preprocedure Evaluation (Addendum)
Anesthesia Evaluation    History of Anesthesia Complications Negative for: history of anesthetic complications  Airway Mallampati: II  TM Distance: >3 FB Neck ROM: Full    Dental  (+) Teeth Intact, Dental Advisory Given   Pulmonary neg shortness of breath, neg sleep apnea, neg recent URI, former smoker,    breath sounds clear to auscultation       Cardiovascular hypertension,  Rhythm:Regular     Neuro/Psych PSYCHIATRIC DISORDERS Depression negative neurological ROS     GI/Hepatic Neg liver ROS, gallstones   Endo/Other  diabetes, Type 2, Oral Hypoglycemic Agents  Renal/GU negative Renal ROS     Musculoskeletal  (+) Arthritis ,   Abdominal   Peds  Hematology negative hematology ROS (+)   Anesthesia Other Findings   Reproductive/Obstetrics                            Anesthesia Physical Anesthesia Plan  ASA: II  Anesthesia Plan: General   Post-op Pain Management:    Induction: Intravenous  Airway Management Planned: Oral ETT  Additional Equipment: None  Intra-op Plan:   Post-operative Plan: Extubation in OR  Informed Consent: I have reviewed the patients History and Physical, chart, labs and discussed the procedure including the risks, benefits and alternatives for the proposed anesthesia with the patient or authorized representative who has indicated his/her understanding and acceptance.   Dental advisory given  Plan Discussed with: CRNA and Surgeon  Anesthesia Plan Comments:         Anesthesia Quick Evaluation

## 2016-07-05 NOTE — Op Note (Signed)
07/05/2016  1:47 PM  PATIENT:  Tracy Anderson  55 y.o. female  PRE-OPERATIVE DIAGNOSIS:  Symptomatic Cholelithiasis, Gall Bladder Polyp  POST-OPERATIVE DIAGNOSIS:  Symptomatic cholelithiasis, gallbladder polyp  PROCEDURE:  Procedure(s): LAPAROSCOPIC CHOLECYSTECTOMY  SURGEON:  Surgeon(s): Georganna Skeans, MD  ASSISTANTS: none   ANESTHESIA:   local and general  EBL:  No intake/output data recorded.  BLOOD ADMINISTERED:none  DRAINS: none   SPECIMEN:  Excision  DISPOSITION OF SPECIMEN:  PATHOLOGY  COUNTS:  YES  DICTATION: .Dragon Dictation Completely presents for cholecystectomy. She was identified in the preop holding area. She received intravenous antibiotics. Informed consent was obtained. She was brought to the operating room and general endotracheal anesthesia was administered by the anesthesia staff. Her abdomen was prepped and draped in a sterile fashion. Time out procedure was performed.The infraumbilical region was infiltrated with local. Infraumbilical incision was made. Subcutaneous tissues were dissected down revealing the anterior fascia. This was divided sharply along the midline. Peritoneal cavity was entered under direct vision without complication. A 0 Vicryl pursestring was placed around the fascial opening. Hassan trocar was inserted into the abdomen. The abdomen was insufflated with carbon dioxide in standard fashion. Under direct vision a 5 mm epigastric and 2 5 mm right sided ports were placed. Local was used at each port site. The dome of the gallbladder was retracted superior medially. The infundibulum was retracted inferolaterally. Dissection began laterally and progressed medially easily identifying the cystic duct. Dissection continued until we had a critical view between the cystic duct, and gallbladder, and the liver. There was excellent anatomy and her liver function tests were normal so we did not do a cholangiogram. 3 clips were placed proximally on  the cystic duct and one was placed distally and it was divided. Further dissection identified the cystic artery. This was clipped twice proximally, once distally and divided. Gallbladder was taken off the liver bed using Bovie cautery. It was placed in a bag and removed from the abdomen. The liver bed was cauterized to get good hemostasis. The area was irrigated. Clips remained in good position. Liver bed was dry. Irrigation was evacuated and the ports were removed under direct vision. Pneumoperitoneum was released. The infra umbilical fascia was closed by tying the pursestring. All 4 wounds were irrigated and the skin of each was closed with running 4 Vicryl subcuticular followed by liquid band. All counts were correct. She tolerated procedure well without apparent competitions taken recovery in stable condition.  PATIENT DISPOSITION:  PACU - hemodynamically stable.   Delay start of Pharmacological VTE agent (>24hrs) due to surgical blood loss or risk of bleeding:  no  Georganna Skeans, MD, MPH, FACS Pager: [phone]  8/30/20171:47 PM

## 2016-07-05 NOTE — Transfer of Care (Signed)
Immediate Anesthesia Transfer of Care Note  Patient: Tracy Anderson  Procedure(s) Performed: Procedure(s): LAPAROSCOPIC CHOLECYSTECTOMY WITH INTRAOPERATIVE CHOLANGIOGRAM (N/A)  Patient Location: PACU  Anesthesia Type:General  Level of Consciousness: awake, alert , patient cooperative and responds to stimulation  Airway & Oxygen Therapy: Patient Spontanous Breathing  Post-op Assessment: Report given to RN, Post -op Vital signs reviewed and stable and Patient moving all extremities X 4  Post vital signs: Reviewed and stable  Last Vitals:  Vitals:   07/05/16 1104 07/05/16 1403  BP: 139/70   Pulse: (!) 59   Resp: 20   Temp: 36.5 C (P) 36.4 C    Last Pain:  Vitals:   07/05/16 1403  TempSrc:   PainSc: (P) 8          Complications: No apparent anesthesia complications

## 2016-07-05 NOTE — Anesthesia Procedure Notes (Signed)
Procedure Name: Intubation Date/Time: 07/05/2016 1:13 PM Performed by: Tressia Miners LEFFEW Pre-anesthesia Checklist: Patient identified, Patient being monitored, Timeout performed, Emergency Drugs available and Suction available Patient Re-evaluated:Patient Re-evaluated prior to inductionOxygen Delivery Method: Circle System Utilized Preoxygenation: Pre-oxygenation with 100% oxygen Intubation Type: IV induction Ventilation: Mask ventilation without difficulty Laryngoscope Size: Mac and 3 Grade View: Grade I Tube type: Oral Tube size: 7.0 mm Number of attempts: 1 Airway Equipment and Method: Stylet Placement Confirmation: ETT inserted through vocal cords under direct vision,  positive ETCO2 and breath sounds checked- equal and bilateral Secured at: 23 cm Tube secured with: Tape Dental Injury: Teeth and Oropharynx as per pre-operative assessment

## 2016-07-06 ENCOUNTER — Encounter (HOSPITAL_COMMUNITY): Payer: Self-pay | Admitting: General Surgery

## 2016-07-06 DIAGNOSIS — Z0120 Encounter for dental examination and cleaning without abnormal findings: Secondary | ICD-10-CM | POA: Diagnosis not present

## 2016-07-06 NOTE — Anesthesia Postprocedure Evaluation (Signed)
Anesthesia Post Note  Patient: Tracy Anderson  Procedure(s) Performed: Procedure(s) (LRB): LAPAROSCOPIC CHOLECYSTECTOMY (N/A)  Patient location during evaluation: PACU Anesthesia Type: General Level of consciousness: awake Pain management: pain level controlled Vital Signs Assessment: post-procedure vital signs reviewed and stable Respiratory status: spontaneous breathing Cardiovascular status: stable Postop Assessment: no signs of nausea or vomiting Anesthetic complications: no    Last Vitals:  Vitals:   07/05/16 1418 07/05/16 1526  BP: (!) 141/73 (!) 149/80  Pulse: (!) 46 (!) 48  Resp: 10 14  Temp:  36.4 C    Last Pain:  Vitals:   07/05/16 1403  TempSrc:   PainSc: 8                  Dajia Gunnels

## 2016-07-14 MED FILL — LOSARTAN POTASSIUM 50 MG TA: 50 | 30 days supply | Qty: 30 | Fill #2

## 2016-07-25 MED FILL — FARXIGA 10 MG TABLET: 10 | 90 days supply | Qty: 90 | Fill #1

## 2016-08-15 MED FILL — LOSARTAN POTASSIUM 50 MG TA: 50 | 30 days supply | Qty: 30 | Fill #3

## 2016-09-11 ENCOUNTER — Ambulatory Visit: Payer: Self-pay | Admitting: Family Medicine

## 2016-09-15 ENCOUNTER — Other Ambulatory Visit: Payer: Self-pay | Admitting: Adult Health

## 2016-09-15 DIAGNOSIS — I10 Essential (primary) hypertension: Secondary | ICD-10-CM

## 2016-09-15 MED FILL — LOSARTAN POTASSIUM 50 MG TA: 50 | 30 days supply | Qty: 30 | Fill #0

## 2016-09-19 MED FILL — PHENTERMINE 37.5 MG TABLET: 37.5 | 90 days supply | Qty: 90 | Fill #1

## 2016-09-25 ENCOUNTER — Encounter: Payer: Self-pay | Admitting: Family Medicine

## 2016-09-25 ENCOUNTER — Ambulatory Visit (INDEPENDENT_AMBULATORY_CARE_PROVIDER_SITE_OTHER): Payer: 59 | Admitting: Family Medicine

## 2016-09-25 ENCOUNTER — Other Ambulatory Visit: Payer: 59

## 2016-09-25 VITALS — BP 125/76 | HR 65 | Temp 97.9°F | Ht 68.0 in | Wt 182.0 lb

## 2016-09-25 DIAGNOSIS — E1165 Type 2 diabetes mellitus with hyperglycemia: Secondary | ICD-10-CM | POA: Diagnosis not present

## 2016-09-25 DIAGNOSIS — IMO0001 Reserved for inherently not codable concepts without codable children: Secondary | ICD-10-CM

## 2016-09-25 MED ORDER — METFORMIN HCL 500 MG PO TABS: 500.0000 mg | ORAL_TABLET | Freq: Two times a day (BID) | ORAL | 3 refills | Status: DC

## 2016-09-25 MED FILL — metFORMIN HCL 500 MG TABS: 500 | 90 days supply | Qty: 180 | Fill #0

## 2016-09-25 NOTE — Progress Notes (Signed)
Pre visit review using our clinic review tool, if applicable. No additional management support is needed unless otherwise documented below in the visit note. 

## 2016-09-25 NOTE — Progress Notes (Signed)
   Subjective:    Patient ID: Tracy Anderson, female    DOB: 1961/03/24, 55 y.o.   MRN: [id number]  HPI Here to follow up on diabetes. She feels great. She had a cholecystectomy in August and this went well. Her A1c at that time had gone up to 8.4 however. She is taking [place] but she admits to cheating on her diet.    Review of Systems  Constitutional: Negative.   Respiratory: Negative.   Cardiovascular: Negative.   Endocrine: Negative.   Neurological: Negative.        Objective:   Physical Exam  Constitutional: She is oriented to person, place, and time. She appears well-developed and well-nourished.  Neck: No thyromegaly present.  Cardiovascular: Normal rate, regular rhythm, normal heart sounds and intact distal pulses.   Pulmonary/Chest: Effort normal and breath sounds normal.  Lymphadenopathy:    She has no cervical adenopathy.  Neurological: She is alert and oriented to person, place, and time.          Assessment & Plan:  Type 2 diabetes. We went over diet advice again. Add Metformin 500 mg bid to the [place]. Get an A1c soon.  Laurey Morale, MD

## 2016-10-02 ENCOUNTER — Other Ambulatory Visit (INDEPENDENT_AMBULATORY_CARE_PROVIDER_SITE_OTHER): Payer: 59

## 2016-10-02 DIAGNOSIS — E1165 Type 2 diabetes mellitus with hyperglycemia: Secondary | ICD-10-CM | POA: Diagnosis not present

## 2016-10-02 DIAGNOSIS — IMO0001 Reserved for inherently not codable concepts without codable children: Secondary | ICD-10-CM

## 2016-10-02 LAB — HEMOGLOBIN A1C: HEMOGLOBIN A1C: 7.8 % — AB (ref 4.6–6.5)

## 2016-10-17 MED FILL — LOSARTAN POTASSIUM 50 MG TA: 50 | 90 days supply | Qty: 90 | Fill #1

## 2016-10-17 MED FILL — FARXIGA 10 MG TABLET: 10 | 90 days supply | Qty: 90 | Fill #2

## 2016-12-14 ENCOUNTER — Telehealth: Payer: 59 | Admitting: Nurse Practitioner

## 2016-12-14 DIAGNOSIS — J019 Acute sinusitis, unspecified: Secondary | ICD-10-CM

## 2016-12-14 MED ORDER — FLUTICASONE PROPIONATE 50 MCG/ACT NA SUSP: 2.0000 | Freq: Every day | NASAL | 0 refills | Status: DC

## 2016-12-14 MED FILL — FLUTICASONE PROP 50 MCG SPR: 50 | 30 days supply | Qty: 16 | Fill #0

## 2016-12-14 NOTE — Progress Notes (Signed)

## 2016-12-29 ENCOUNTER — Telehealth: Payer: Self-pay | Admitting: Family Medicine

## 2016-12-29 NOTE — Telephone Encounter (Signed)
Pt has been checking her BS  lowest 122 to highest 157. BS  ranges from jan 04-2017 to now. Nurse at [facility] suggest taking metformin 1000 mg at night and take farxiga in morning. Pt is calling to see if md will be in agreement with recommendations.

## 2016-12-29 NOTE — Telephone Encounter (Signed)
Please advise 

## 2016-12-29 NOTE — Telephone Encounter (Signed)
Okay she can try that dosing schedule. Let us know how it works

## 2017-01-02 MED FILL — metFORMIN HCL 500 MG TABS: 500 | 90 days supply | Qty: 180 | Fill #1

## 2017-01-02 NOTE — Telephone Encounter (Signed)
I spoke with pt and went over below advice. 

## 2017-01-05 ENCOUNTER — Telehealth: Payer: Self-pay | Admitting: Family Medicine

## 2017-01-05 DIAGNOSIS — E119 Type 2 diabetes mellitus without complications: Secondary | ICD-10-CM

## 2017-01-05 NOTE — Telephone Encounter (Signed)
Pt would like to go to [facility] on Monday for a1c. Please put order in system

## 2017-01-05 NOTE — Telephone Encounter (Signed)
I spoke with pt and went over below information. 

## 2017-01-05 NOTE — Telephone Encounter (Signed)
The order is in

## 2017-01-08 ENCOUNTER — Other Ambulatory Visit: Payer: 59

## 2017-01-11 ENCOUNTER — Other Ambulatory Visit (INDEPENDENT_AMBULATORY_CARE_PROVIDER_SITE_OTHER): Payer: 59

## 2017-01-11 DIAGNOSIS — E119 Type 2 diabetes mellitus without complications: Secondary | ICD-10-CM | POA: Diagnosis not present

## 2017-01-11 LAB — HEMOGLOBIN A1C: Hgb A1c MFr Bld: 7.5 % — ABNORMAL HIGH (ref 4.6–6.5)

## 2017-01-12 ENCOUNTER — Other Ambulatory Visit: Payer: Self-pay | Admitting: Family Medicine

## 2017-01-16 ENCOUNTER — Other Ambulatory Visit: Payer: Self-pay | Admitting: Family Medicine

## 2017-01-16 ENCOUNTER — Other Ambulatory Visit: Payer: Self-pay | Admitting: Adult Health

## 2017-01-16 DIAGNOSIS — I10 Essential (primary) hypertension: Secondary | ICD-10-CM

## 2017-01-16 NOTE — Telephone Encounter (Signed)
Dr. Fry patient. 

## 2017-01-17 MED FILL — LOSARTAN POTASSIUM 50 MG TA: 50 | 90 days supply | Qty: 90 | Fill #0

## 2017-01-17 MED FILL — PHENTERMINE 37.5 MG TABLET: 37.5 | 90 days supply | Qty: 90 | Fill #0

## 2017-01-17 NOTE — Telephone Encounter (Signed)
Call in #[phone] with 1 rf  

## 2017-01-25 ENCOUNTER — Other Ambulatory Visit: Payer: Self-pay | Admitting: Family Medicine

## 2017-01-25 DIAGNOSIS — Z1231 Encounter for screening mammogram for malignant neoplasm of breast: Secondary | ICD-10-CM

## 2017-01-25 MED FILL — FARXIGA 10 MG TABLET: 10 | 90 days supply | Qty: 90 | Fill #3

## 2017-02-01 ENCOUNTER — Telehealth: Payer: Self-pay | Admitting: Family Medicine

## 2017-02-01 NOTE — Telephone Encounter (Signed)
Pt was given a glucometer from [facility]). Pt has accu chek glucometer and just needs test strips and lancet.  Pt checking her bs once a day and requesting #100 test strips. [facility]

## 2017-02-06 MED ORDER — GLUCOSE BLOOD VI STRP: ORAL_STRIP | 1 refills | Status: DC

## 2017-02-06 MED ORDER — ACCU-CHEK SOFT TOUCH LANCETS MISC: 1 refills | Status: DC

## 2017-02-06 MED FILL — ACCU-CHEK FASTCLIX LANCETS: 90 days supply | Qty: 102 | Fill #0

## 2017-02-06 MED FILL — ACCU-CHEK GUIDE TEST STRIP: 90 days supply | Qty: 100 | Fill #0

## 2017-02-06 NOTE — Telephone Encounter (Signed)
I sent scripts e-scribe to [facility] pharmacy, spoke with pt, send for accu chek guide, which I did.

## 2017-02-06 NOTE — Telephone Encounter (Signed)
Pt following up on her rx refill request.  Pt used her last strip today. Can you send to pharm? Thank you!!

## 2017-02-23 ENCOUNTER — Other Ambulatory Visit: Payer: Self-pay

## 2017-02-23 ENCOUNTER — Telehealth: Payer: Self-pay | Admitting: Family Medicine

## 2017-02-23 MED ORDER — METFORMIN HCL 500 MG PO TABS: 1000.0000 mg | ORAL_TABLET | Freq: Two times a day (BID) | ORAL | 2 refills | Status: DC

## 2017-02-23 MED ORDER — METFORMIN HCL 500 MG PO TABS: 1000.0000 mg | ORAL_TABLET | Freq: Two times a day (BID) | ORAL | 3 refills | Status: DC

## 2017-02-23 MED FILL — metFORMIN HCL 500 MG TABS: 500 | 30 days supply | Qty: 120 | Fill #0

## 2017-02-23 NOTE — Telephone Encounter (Signed)
Refill was sent to patient pharmacy.

## 2017-02-23 NOTE — Telephone Encounter (Signed)
Pt request refill  metFORMIN (GLUCOPHAGE) 500 MG tablet  Pt states Dr Sarajane Jews changed her regimen, now she takes 2/ morning and 2 at night. (4 a day)  Pt instructed to call back when she got low for a new rx, but pt forgot and is now out. Can you send 90 day to:   [place], [place]

## 2017-02-23 NOTE — Telephone Encounter (Addendum)
Pt takes 4 a day so that would be 120 tabs /month.  Only 90 sent in. Can you resend? Thanks!!  [facility]

## 2017-02-26 ENCOUNTER — Other Ambulatory Visit: Payer: Self-pay | Admitting: Family Medicine

## 2017-02-26 ENCOUNTER — Ambulatory Visit: Payer: 59

## 2017-02-26 ENCOUNTER — Ambulatory Visit
Admission: RE | Admit: 2017-02-26 | Discharge: 2017-02-26 | Disposition: A | Discharge status: Home / Self Care | Payer: 59 | Source: Ambulatory Visit | Attending: Family Medicine | Admitting: Family Medicine

## 2017-02-26 DIAGNOSIS — Z1231 Encounter for screening mammogram for malignant neoplasm of breast: Secondary | ICD-10-CM | POA: Diagnosis not present

## 2017-02-26 NOTE — Telephone Encounter (Signed)
Rx already send to the pharmacy.

## 2017-03-20 MED FILL — metFORMIN HCL 500 MG TABS: 500 | 30 days supply | Qty: 120 | Fill #1

## 2017-04-16 DIAGNOSIS — H5213 Myopia, bilateral: Secondary | ICD-10-CM | POA: Diagnosis not present

## 2017-04-19 MED FILL — LOSARTAN POTASSIUM 50 MG TA: 50 | 30 days supply | Qty: 30 | Fill #1

## 2017-04-23 ENCOUNTER — Ambulatory Visit (INDEPENDENT_AMBULATORY_CARE_PROVIDER_SITE_OTHER): Payer: 59 | Admitting: Family Medicine

## 2017-04-23 ENCOUNTER — Encounter: Payer: Self-pay | Admitting: Family Medicine

## 2017-04-23 VITALS — BP 124/88 | HR 68 | Ht 68.0 in | Wt 183.0 lb

## 2017-04-23 DIAGNOSIS — E119 Type 2 diabetes mellitus without complications: Secondary | ICD-10-CM | POA: Insufficient documentation

## 2017-04-23 DIAGNOSIS — R7989 Other specified abnormal findings of blood chemistry: Secondary | ICD-10-CM

## 2017-04-23 DIAGNOSIS — I10 Essential (primary) hypertension: Secondary | ICD-10-CM

## 2017-04-23 DIAGNOSIS — Z0000 Encounter for general adult medical examination without abnormal findings: Secondary | ICD-10-CM | POA: Diagnosis not present

## 2017-04-23 LAB — POC URINALSYSI DIPSTICK (AUTOMATED)
BILIRUBIN UA: NEGATIVE
Blood, UA: NEGATIVE
KETONES UA: NEGATIVE
LEUKOCYTES UA: NEGATIVE
Nitrite, UA: NEGATIVE
PROTEIN UA: NEGATIVE
Spec Grav, UA: 1.025 (ref 1.010–1.025)
Urobilinogen, UA: 0.2 E.U./dL
pH, UA: 6 (ref 5.0–8.0)

## 2017-04-23 LAB — LIPID PANEL
CHOL/HDL RATIO: 4
Cholesterol: 188 mg/dL (ref 0–200)
HDL: 46.5 mg/dL (ref >39.00)
NONHDL: 141.92
Triglycerides: 236 mg/dL — ABNORMAL HIGH (ref 0.0–149.0)
VLDL: 47.2 mg/dL — ABNORMAL HIGH (ref 0.0–40.0)

## 2017-04-23 LAB — BASIC METABOLIC PANEL
BUN: 18 mg/dL (ref 6–23)
CHLORIDE: 100 meq/L (ref 96–112)
CO2: 28 meq/L (ref 19–32)
Calcium: 9.6 mg/dL (ref 8.4–10.5)
Creatinine, Ser: 0.74 mg/dL (ref 0.40–1.20)
GFR: 85.65 mL/min (ref >60.00)
Glucose, Bld: 127 mg/dL — ABNORMAL HIGH (ref 70–99)
POTASSIUM: 4.4 meq/L (ref 3.5–5.1)
SODIUM: 137 meq/L (ref 135–145)

## 2017-04-23 LAB — CBC WITH DIFFERENTIAL/PLATELET
BASOS PCT: 0.5 % (ref 0.0–3.0)
Basophils Absolute: 0 10*3/uL (ref 0.0–0.1)
EOS PCT: 2.1 % (ref 0.0–5.0)
Eosinophils Absolute: 0.1 10*3/uL (ref 0.0–0.7)
HCT: 42 % (ref 36.0–46.0)
Hemoglobin: 13.8 g/dL (ref 12.0–15.0)
LYMPHS ABS: 1.7 10*3/uL (ref 0.7–4.0)
Lymphocytes Relative: 27.1 % (ref 12.0–46.0)
MCHC: 32.8 g/dL (ref 30.0–36.0)
MCV: 87.8 fl (ref 78.0–100.0)
MONOS PCT: 5.7 % (ref 3.0–12.0)
Monocytes Absolute: 0.4 10*3/uL (ref 0.1–1.0)
NEUTROS PCT: 64.6 % (ref 43.0–77.0)
Neutro Abs: 4 10*3/uL (ref 1.4–7.7)
Platelets: 250 10*3/uL (ref 150.0–400.0)
RBC: 4.78 Mil/uL (ref 3.87–5.11)
RDW: 13.5 % (ref 11.5–15.5)
WBC: 6.2 10*3/uL (ref 4.0–10.5)

## 2017-04-23 LAB — HEPATIC FUNCTION PANEL
ALK PHOS: 84 U/L (ref 39–117)
ALT: 20 U/L (ref 0–35)
AST: 13 U/L (ref 0–37)
Albumin: 4.6 g/dL (ref 3.5–5.2)
BILIRUBIN DIRECT: 0.1 mg/dL (ref 0.0–0.3)
BILIRUBIN TOTAL: 0.6 mg/dL (ref 0.2–1.2)
Total Protein: 7 g/dL (ref 6.0–8.3)

## 2017-04-23 LAB — HEMOGLOBIN A1C: Hgb A1c MFr Bld: 7.6 % — ABNORMAL HIGH (ref 4.6–6.5)

## 2017-04-23 LAB — LDL CHOLESTEROL, DIRECT: Direct LDL: 82 mg/dL

## 2017-04-23 LAB — TSH: TSH: 1.15 u[IU]/mL (ref 0.35–4.50)

## 2017-04-23 MED ORDER — LOSARTAN POTASSIUM 50 MG PO TABS: 50.0000 mg | ORAL_TABLET | Freq: Every day | ORAL | 3 refills | Status: DC

## 2017-04-23 MED ORDER — ESTRADIOL 0.1 MG/GM VA CREA: 1.0000 | TOPICAL_CREAM | Freq: Every day | VAGINAL | 12 refills | Status: DC

## 2017-04-23 MED ORDER — METFORMIN HCL 500 MG PO TABS: 1000.0000 mg | ORAL_TABLET | Freq: Two times a day (BID) | ORAL | 3 refills | Status: DC

## 2017-04-23 MED ORDER — FARXIGA 10 MG PO TABS: 10.0000 mg | ORAL_TABLET | Freq: Every day | ORAL | 3 refills | Status: DC

## 2017-04-23 MED FILL — FARXIGA 10 MG TABLET: 10 | 90 days supply | Qty: 90 | Fill #0

## 2017-04-23 MED FILL — metFORMIN HCL 500 MG TABS: 500 | 90 days supply | Qty: 360 | Fill #0

## 2017-04-23 NOTE — Progress Notes (Signed)
   Subjective:    Patient ID: Tracy Anderson, female    DOB: 11-08-1960, 56 y.o.   MRN: [id number]  HPI 56 yr old female for a well exam. She has been doing well, and she is excited about recently joining the [employer] and [employer] program. She does complain about having discomfort during intercourse and about a low libido. She thinks her lack of interest is caused by the expectation of discomfort. Her last menses was 6 years ago.    Review of Systems  Constitutional: Negative.   HENT: Negative.   Eyes: Negative.   Respiratory: Negative.   Cardiovascular: Negative.   Gastrointestinal: Negative.   Genitourinary: Negative for decreased urine volume, difficulty urinating, dyspareunia, dysuria, enuresis, flank pain, frequency, hematuria, pelvic pain and urgency.  Musculoskeletal: Negative.   Skin: Negative.   Neurological: Negative.   Psychiatric/Behavioral: Negative.        Objective:   Physical Exam  Constitutional: She is oriented to person, place, and time. She appears well-developed and well-nourished. No distress.  HENT:  Head: Normocephalic and atraumatic.  Right Ear: External ear normal.  Left Ear: External ear normal.  Nose: Nose normal.  Mouth/Throat: Oropharynx is clear and moist. No oropharyngeal exudate.  Eyes: Conjunctivae and EOM are normal. Pupils are equal, round, and reactive to light. No scleral icterus.  Neck: Normal range of motion. Neck supple. No JVD present. No thyromegaly present.  Cardiovascular: Normal rate, regular rhythm, normal heart sounds and intact distal pulses.  Exam reveals no gallop and no friction rub.   No murmur heard. Pulmonary/Chest: Effort normal and breath sounds normal. No respiratory distress. She has no wheezes. She has no rales. She exhibits no tenderness.  Abdominal: Soft. Bowel sounds are normal. She exhibits no distension and no mass. There is no tenderness. There is no rebound and no guarding.    Musculoskeletal: Normal range of motion. She exhibits no edema or tenderness.  Lymphadenopathy:    She has no cervical adenopathy.  Neurological: She is alert and oriented to person, place, and time. She has normal reflexes. No cranial nerve deficit. She exhibits normal muscle tone. Coordination normal.  Skin: Skin is warm and dry. No rash noted. No erythema.  Psychiatric: She has a normal mood and affect. Her behavior is normal. Judgment and thought content normal.          Assessment & Plan:  Well exam. We discussed diet and exercise. Get fasting labs. Try Estrace vaginal cream for the vaginal dryness.  Alysia Penna, MD

## 2017-04-23 NOTE — Patient Instructions (Signed)
WE NOW OFFER   Tracy Anderson's FAST TRACK!!!  SAME DAY Appointments for ACUTE CARE  Such as: Sprains, Injuries, cuts, abrasions, rashes, muscle pain, joint pain, back pain Colds, flu, sore throats, headache, allergies, cough, fever  Ear pain, sinus and eye infections Abdominal pain, nausea, vomiting, diarrhea, upset stomach Animal/insect bites  3 Easy Ways to Schedule: Walk-In Scheduling Call in scheduling Mychart Sign-up: [email or web address]         

## 2017-04-26 ENCOUNTER — Telehealth: Payer: Self-pay | Admitting: Family Medicine

## 2017-04-26 MED FILL — ESTRADIOL 0.1 MG/GM CRM: 0.1 | 21 days supply | Qty: 43 | Fill #0

## 2017-04-26 NOTE — Telephone Encounter (Signed)
Per Dr. Sarajane Jews it should be 2 grams every day.

## 2017-05-03 MED FILL — ACCU-CHEK GUIDE TEST STRIP: 90 days supply | Qty: 100 | Fill #1

## 2017-05-10 ENCOUNTER — Encounter: Payer: Self-pay | Admitting: Family Medicine

## 2017-05-11 NOTE — Telephone Encounter (Signed)
Actually she is already taking 10 mg of Farxiga. I think the best choice at this point would be for me to refer her to Endocrine for the diabetes. If she agrees, I will do so.

## 2017-05-22 MED FILL — LOSARTAN POTASSIUM 50 MG TA: 50 | 90 days supply | Qty: 90 | Fill #0

## 2017-05-22 MED FILL — PHENTERMINE 37.5 MG TABLET: 37.5 | 90 days supply | Qty: 90 | Fill #1

## 2017-06-12 ENCOUNTER — Encounter (INDEPENDENT_AMBULATORY_CARE_PROVIDER_SITE_OTHER): Payer: 59 | Admitting: Family Medicine

## 2017-06-12 NOTE — Progress Notes (Signed)
Pt BMI below 30, not eligible for our clinic

## 2017-06-29 DIAGNOSIS — Z0120 Encounter for dental examination and cleaning without abnormal findings: Secondary | ICD-10-CM | POA: Diagnosis not present

## 2017-07-17 ENCOUNTER — Encounter (INDEPENDENT_AMBULATORY_CARE_PROVIDER_SITE_OTHER): Payer: Self-pay | Admitting: Family Medicine

## 2017-07-17 ENCOUNTER — Ambulatory Visit (INDEPENDENT_AMBULATORY_CARE_PROVIDER_SITE_OTHER): Payer: 59 | Admitting: Family Medicine

## 2017-07-17 VITALS — BP 120/78 | HR 64 | Temp 97.9°F | Ht 67.0 in | Wt 184.0 lb

## 2017-07-17 DIAGNOSIS — R9431 Abnormal electrocardiogram [ECG] [EKG]: Secondary | ICD-10-CM | POA: Diagnosis not present

## 2017-07-17 DIAGNOSIS — R0602 Shortness of breath: Secondary | ICD-10-CM | POA: Insufficient documentation

## 2017-07-17 DIAGNOSIS — I10 Essential (primary) hypertension: Secondary | ICD-10-CM | POA: Insufficient documentation

## 2017-07-17 DIAGNOSIS — Z1389 Encounter for screening for other disorder: Secondary | ICD-10-CM

## 2017-07-17 DIAGNOSIS — E119 Type 2 diabetes mellitus without complications: Secondary | ICD-10-CM

## 2017-07-17 DIAGNOSIS — Z6830 Body mass index (BMI) 30.0-30.9, adult: Secondary | ICD-10-CM | POA: Diagnosis not present

## 2017-07-17 DIAGNOSIS — Z9189 Other specified personal risk factors, not elsewhere classified: Secondary | ICD-10-CM | POA: Diagnosis not present

## 2017-07-17 DIAGNOSIS — R5383 Other fatigue: Secondary | ICD-10-CM

## 2017-07-17 DIAGNOSIS — Z0289 Encounter for other administrative examinations: Secondary | ICD-10-CM

## 2017-07-17 DIAGNOSIS — Z1331 Encounter for screening for depression: Secondary | ICD-10-CM

## 2017-07-17 DIAGNOSIS — E669 Obesity, unspecified: Secondary | ICD-10-CM | POA: Insufficient documentation

## 2017-07-17 MED ORDER — GLUCOSE BLOOD VI STRP: ORAL_STRIP | 0 refills | Status: DC

## 2017-07-17 MED ORDER — ACCU-CHEK SOFT TOUCH LANCETS MISC: 0 refills | Status: DC

## 2017-07-17 MED FILL — ACCU-CHEK FASTCLIX LANCETS: 51 days supply | Qty: 102 | Fill #0

## 2017-07-17 MED FILL — ACCU-CHEK GUIDE TEST STRIP: 50 days supply | Qty: 100 | Fill #0

## 2017-07-17 NOTE — Progress Notes (Signed)
.  Office: [phone]  /  Fax: [phone]   HPI:   Chief Complaint: OBESITY  Tracy Anderson (MR# [id number]) is a 56 y.o. female who presents on 07/17/2017 for obesity evaluation and treatment. Current BMI is Body mass index is 28.82 kg/m.Tracy Anderson has struggled with obesity for years and has been unsuccessful in either losing weight or maintaining long term weight loss. Tracy Anderson attended our information session and states she is currently in the action stage of change and ready to dedicate time achieving and maintaining a healthier weight.  Tracy Anderson states her family eats meals together she thinks her family will eat healthier with  her her desired weight loss is 25 lbs she has been heavy most of  her life she started gaining weight at puberty/56 yrs old her heaviest weight ever was 235 lbs. she has significant food cravings issues  she snacks frequently in the evenings she is frequently drinking liquids with calories she frequently makes poor food choices she has problems with excessive hunger  she frequently eats larger portions than normal  she has binge eating behaviors she struggles with emotional eating    Fatigue Tracy Anderson feels her energy is lower than it should be. This has worsened with weight gain and has not worsened recently. Manasa admits to daytime somnolence and  denies waking up still tired. Patient is at risk for obstructive sleep apnea. Patent has a history of symptoms of daytime fatigue and hypertension. Patient generally gets 6 hours of sleep per night, and states they generally have restless sleep. Snoring is not present. Apneic episodes are not present. Epworth Sleepiness Score is 7  Dyspnea on exertion Tracy Anderson notes increasing shortness of breath with exercising and seems to be worsening over time with weight gain. She notes getting out of breath sooner with activity than she used to. This has not gotten worse recently. Tracy Anderson denies  orthopnea.  Abnormal EKG Recia had an abnormal EKG today with new onset very mild heart murmur. She has a strong family history of coronary artery disease and cardiovascular accident.  Hypertension Tracy Anderson is a 56 y.o. female with hypertension. Tracy Anderson denies chest pain but can often feel her heartbeat. She is attempting weight loss to help control her blood pressure with the goal of decreasing her risk of heart attack and stroke. Tracy Anderson blood pressure is currently controlled on Losartan..  At risk for cardiovascular disease Tracy Anderson is at a higher than average risk for cardiovascular disease due to obesity, hypertension and diabetes. She currently denies any chest pain.  Diabetes II Tracy Anderson has a diagnosis of diabetes type II and was diagnosed approximately 13 years ago. Tracy Anderson states fasting BGs range in the 140's and denies any hypoglycemic episodes. Last A1c was at 7.6 She is attempting to work on intensive lifestyle modifications including diet, exercise, and weight loss to help control her blood glucose levels. Her diabetes is not currently controlled on Metformin and she admits polyphagia.   Depression Screen Tracy Anderson Food and Mood (modified PHQ-9) score was  Depression screen PHQ 2/9 07/17/2017  Decreased Interest 1  Down, Depressed, Hopeless 1  PHQ - 2 Score 2  Altered sleeping 2  Tired, decreased energy 1  Change in appetite 1  Feeling bad or failure about yourself  2  Trouble concentrating 0  Moving slowly or fidgety/restless 0  Suicidal thoughts 0  PHQ-9 Score 8  Difficult doing work/chores Not difficult at all    ALLERGIES: Allergies  Allergen Reactions  .  Lisinopril Cough  . Shrimp [Shellfish Allergy] Diarrhea    Severe stomach cramps and diarrhea  . Clarithromycin Other (See Comments)    UNSPECIFIED     MEDICATIONS: Current Outpatient Prescriptions on File Prior to Visit  Medication Sig Dispense Refill  . estradiol  (ESTRACE VAGINAL) 0.1 MG/GM vaginal cream Place 1 Applicatorful vaginally at bedtime. (Patient taking differently: Place 1 Applicatorful vaginally at bedtime. 2 grams) 42.5 g 12  . FARXIGA 10 MG TABS tablet Take 10 mg by mouth daily. 90 tablet 3  . glucose blood (ACCU-CHEK GUIDE) test strip Use 1 X a day and diagnosis code is E 11.9 100 each 1  . Lancets (ACCU-CHEK SOFT TOUCH) lancets Use 1 X a day and diagnosis code is E 11.9 100 each 1  . losartan (COZAAR) 50 MG tablet Take 1 tablet (50 mg total) by mouth daily. 90 tablet 3  . metFORMIN (GLUCOPHAGE) 500 MG tablet Take 2 tablets (1,000 mg total) by mouth 2 (two) times daily with a meal. 360 tablet 3  . fluticasone (FLONASE) 50 MCG/ACT nasal spray Place 2 sprays into both nostrils daily. 16 g 0   No current facility-administered medications on file prior to visit.     PAST MEDICAL HISTORY: Past Medical History:  Diagnosis Date  . Arthritis   . Asthma    as a child  . Back pain   . Depression   . Diabetes mellitus type II   . Herpes genitalia   . Hyperlipidemia   . Hypertension   . Shellfish allergy   . Shingles 2012   right flank    PAST SURGICAL HISTORY: Past Surgical History:  Procedure Laterality Date  . CHOLECYSTECTOMY N/A 07/05/2016   Procedure: LAPAROSCOPIC CHOLECYSTECTOMY;  Surgeon: Georganna Skeans, MD;  Location: [place];  Service: General;  Laterality: N/A;  . colonscopy  05/20/09   per Dr. Deatra Ina, hyperplstic polyps, repeat in 10 yers   . TYMPANOSTOMY TUBE PLACEMENT      SOCIAL HISTORY: Social History  Substance Use Topics  . Smoking status: Former Smoker    Quit date: 05/05/1991  . Smokeless tobacco: Never Used  . Alcohol use 1.2 oz/week    2 Standard drinks or equivalent per week     Comment: occ    FAMILY HISTORY: Family History  Problem Relation Age of Onset  . Arthritis Other   . Cancer Other        cervical  . Depression Other   . Diabetes Other   . Ulcers Other   . Breast cancer Maternal  Grandmother   . Diabetes Mother   . Heart disease Mother   . Cancer Mother   . Depression Mother   . Alcoholism Mother   . Eating disorder Mother   . Diabetes Father   . Hypertension Father   . Depression Father   . Alcoholism Father     ROS: Review of Systems  Constitutional: Positive for malaise/fatigue.  Eyes:       Wear Glasses or Contacts  Respiratory: Positive for shortness of breath (on exertion).   Cardiovascular: Negative for chest pain and orthopnea.       Leg Cramping Very Cold Feet or Hands  Gastrointestinal: Positive for diarrhea.  Musculoskeletal: Positive for back pain.  Endo/Heme/Allergies:       Polyphagia Negative hypoglycemia  Psychiatric/Behavioral: The patient has insomnia.     PHYSICAL EXAM: Blood pressure 120/78, pulse 64, temperature 97.9 F (36.6 C), temperature source Oral, height 5\' 7"  (1.702 m), weight  184 lb (83.5 kg), last menstrual period 08/08/2012, SpO2 100 %. Body mass index is 28.82 kg/m. Physical Exam  Constitutional: She is oriented to person, place, and time. She appears well-developed and well-nourished.  Cardiovascular:  Murmur (Grade 1/6 early systolic murmur) heard. Pulmonary/Chest: Effort normal.  Abdominal: Soft.  Musculoskeletal: Normal range of motion.  Neurological: She is oriented to person, place, and time.  Skin: Skin is warm and dry.  Psychiatric: She has a normal mood and affect. Her behavior is normal.  Vitals reviewed.   RECENT LABS AND TESTS: BMET    Component Value Date/Time   NA 137 04/23/2017 0925   K 4.4 04/23/2017 0925   CL 100 04/23/2017 0925   CO2 28 04/23/2017 0925   GLUCOSE 127 (H) 04/23/2017 0925   BUN 18 04/23/2017 0925   CREATININE 0.74 04/23/2017 0925   CALCIUM 9.6 04/23/2017 0925   GFRNONAA >60 06/27/2016 0906   GFRAA >60 06/27/2016 0906   Lab Results  Component Value Date   HGBA1C 7.6 (H) 04/23/2017   No results found for: INSULIN CBC    Component Value Date/Time   WBC 6.2  04/23/2017 0925   RBC 4.78 04/23/2017 0925   HGB 13.8 04/23/2017 0925   HCT 42.0 04/23/2017 0925   PLT 250.0 04/23/2017 0925   MCV 87.8 04/23/2017 0925   MCH 28.2 06/27/2016 0906   MCHC 32.8 04/23/2017 0925   RDW 13.5 04/23/2017 0925   LYMPHSABS 1.7 04/23/2017 0925   MONOABS 0.4 04/23/2017 0925   EOSABS 0.1 04/23/2017 0925   BASOSABS 0.0 04/23/2017 0925   Iron/TIBC/Ferritin/ %Sat No results found for: IRON, TIBC, FERRITIN, IRONPCTSAT Lipid Panel     Component Value Date/Time   CHOL 188 04/23/2017 0925   TRIG 236.0 (H) 04/23/2017 0925   HDL 46.50 04/23/2017 0925   CHOLHDL 4 04/23/2017 0925   VLDL 47.2 (H) 04/23/2017 0925   LDLCALC 91 12/06/2007 1048   LDLDIRECT 82.0 04/23/2017 0925   Hepatic Function Panel     Component Value Date/Time   PROT 7.0 04/23/2017 0925   ALBUMIN 4.6 04/23/2017 0925   AST 13 04/23/2017 0925   ALT 20 04/23/2017 0925   ALKPHOS 84 04/23/2017 0925   BILITOT 0.6 04/23/2017 0925   BILIDIR 0.1 04/23/2017 0925      Component Value Date/Time   TSH 1.15 04/23/2017 0925   TSH 1.77 03/21/2016 0732   TSH 0.90 12/06/2007 1048    ECG  shows NSR with a rate of 66 BPM INDIRECT CALORIMETER done today shows a VO2 of 190 and a REE of 1323. Her calculated basal metabolic rate is 1610 thus her basal metabolic rate is worse than expected.    ASSESSMENT AND PLAN: Other fatigue - Plan: EKG 12-Lead, Vitamin B12, CBC With Differential, Folate, Lipid Panel With LDL/HDL Ratio, T3, T4, free, TSH, VITAMIN D 25 Hydroxy (Vit-D Deficiency, Fractures)  Shortness of breath on exertion  Type 2 diabetes mellitus without complication, without long-term current use of insulin (HCC) - Plan: Comprehensive metabolic panel, Hemoglobin A1c, Insulin, random, Lancets (ACCU-CHEK SOFT TOUCH) lancets, glucose blood (ACCU-CHEK GUIDE) test strip  Abnormal ECG - Plan: Lipid Panel With LDL/HDL Ratio  Essential hypertension  At risk for heart disease  Depression screening  Class 1  obesity with serious comorbidity and body mass index (BMI) of 30.0 to 30.9 in adult, unspecified obesity type  PLAN:  Fatigue Tracy Anderson was informed that her fatigue may be related to obesity, depression or many other causes. Labs will be ordered,  and in the meanwhile Keyara has agreed to work on diet, exercise and weight loss to help with fatigue. Proper sleep hygiene was discussed including the need for 7-8 hours of quality sleep each night. A sleep study was not ordered based on symptoms and Epworth score.  Dyspnea on exertion Tracy Anderson's shortness of breath appears to be obesity related and exercise induced. She has agreed to work on weight loss and gradually increase exercise to treat her exercise induced shortness of breath. If Danashia follows our instructions and loses weight without improvement of her shortness of breath, we will plan to refer to pulmonology. We will monitor this condition regularly. Tracy Anderson agrees to this plan.  Abnormal EKG We will refer Tracy Anderson for an echocardiogram. Tracy Anderson agrees to discontinue Phentermine and will follow up with our clinic in 2 weeks.  Hypertension We discussed sodium restriction, working on healthy weight loss, and a regular exercise program as the means to achieve improved blood pressure control. Tracy Anderson agreed with this plan and agreed to follow up as directed. We will re-check blood pressure in 2 weeks and continue to monitor her blood pressure as well as her progress with the above lifestyle modifications. She agrees to discontinue Phentermine and will continue Losartan as prescribed and will watch for signs of hypotension as she continues her lifestyle modifications.  Cardiovascular risk counseling Ares was given extended (15 minutes) coronary artery disease prevention counseling today. She is 56 y.o. female and has risk factors for heart disease including obesity, hypertension and diabetes. We discussed intensive lifestyle  modifications today with an emphasis on specific weight loss instructions and strategies. Pt was also informed of the importance of increasing exercise and decreasing saturated fats to help prevent heart disease.  Diabetes II Tracy Anderson has been given extensive diabetes education by myself today including ideal fasting and post-prandial blood glucose readings, individual ideal Hgb A1c goals and hypoglycemia prevention. We discussed the importance of good blood sugar control to decrease the likelihood of diabetic complications such as nephropathy, neuropathy, limb loss, blindness, coronary artery disease, and death. We discussed the importance of intensive lifestyle modification including diet, exercise and weight loss as the first line treatment for diabetes. She agrees to check her blood sugar 2 times daily. Tracy Anderson agrees to continue her diabetes medications as prescribed and we will refill test strips and lancets for 1 month and she will follow up at the agreed upon time.  Depression Screen Tracy Anderson had a mildly positive depression screening. Depression is commonly associated with obesity and often results in emotional eating behaviors. We will monitor this closely and work on CBT to help improve the non-hunger eating patterns. Referral to Psychology may be required if no improvement is seen as she continues in our clinic.  Obesity Tracy Anderson is currently in the action stage of change and her goal is to continue with weight loss efforts She has agreed to follow the Category 2 plan +100 calories Tracy Anderson has been instructed to work up to a goal of 150 minutes of combined cardio and strengthening exercise per week for weight loss and overall health benefits. We discussed the following Behavioral Modification Strategies today: no skipping meals, increasing lean protein intake, decreasing simple carbohydrates  and work on meal planning and easy cooking plans  Tracy Anderson has agreed to follow up with our  clinic in 2 weeks. She was informed of the importance of frequent follow up visits to maximize her success with intensive lifestyle modifications for her multiple health conditions. She was informed we  would discuss her lab results at her next visit unless there is a critical issue that needs to be addressed sooner. Tracy Anderson agreed to keep her next visit at the agreed upon time to discuss these results.  I, Doreene Nest, am acting as transcriptionist for Dennard Nip, MD  I have reviewed the above documentation for accuracy and completeness, and I agree with the above. -Dennard Nip, MD    OBESITY BEHAVIORAL INTERVENTION VISIT  Today's visit was # 1 out of 61.  Starting weight: 184 lbs Starting date: 07/17/17 Today's weight : 184 lbs Today's date: 07/17/2017 Total lbs lost to date: 0 (Patients must lose 7 lbs in the first 6 months to continue with counseling)   ASK: We discussed the diagnosis of obesity with Tracy Anderson today and Tracy Anderson agreed to give [place] permission to discuss obesity behavioral modification therapy today.  ASSESS: Joleah has the diagnosis of obesity and her BMI today is 28.81 Neliah is in the action stage of change   ADVISE: Rogina was educated on the multiple health risks of obesity as well as the benefit of weight loss to improve her health. She was advised of the need for long term treatment and the importance of lifestyle modifications.  AGREE: Multiple dietary modification options and treatment options were discussed and  Maryna agreed to follow the Category 2 plan +100 calories We discussed the following Behavioral Modification Strategies today: no skipping meals, increasing lean protein intake, decreasing simple carbohydrates  and work on meal planning and easy cooking plans

## 2017-07-17 NOTE — Addendum Note (Signed)
Addended by: Wilfrid Lund on: 07/17/2017 02:41 PM   Modules accepted: Orders

## 2017-07-18 DIAGNOSIS — R5383 Other fatigue: Secondary | ICD-10-CM | POA: Diagnosis not present

## 2017-07-18 DIAGNOSIS — R9431 Abnormal electrocardiogram [ECG] [EKG]: Secondary | ICD-10-CM | POA: Diagnosis not present

## 2017-07-18 DIAGNOSIS — E119 Type 2 diabetes mellitus without complications: Secondary | ICD-10-CM | POA: Diagnosis not present

## 2017-07-19 LAB — FOLATE

## 2017-07-19 LAB — COMPREHENSIVE METABOLIC PANEL
ALK PHOS: 84 IU/L (ref 39–117)
ALT: 25 IU/L (ref 0–32)
AST: 18 IU/L (ref 0–40)
Albumin/Globulin Ratio: 1.8 (ref 1.2–2.2)
Albumin: 4.6 g/dL (ref 3.5–5.5)
BILIRUBIN TOTAL: 0.4 mg/dL (ref 0.0–1.2)
BUN/Creatinine Ratio: 26 — ABNORMAL HIGH (ref 9–23)
BUN: 19 mg/dL (ref 6–24)
CHLORIDE: 99 mmol/L (ref 96–106)
CO2: 20 mmol/L (ref 20–29)
Calcium: 9.4 mg/dL (ref 8.7–10.2)
Creatinine, Ser: 0.74 mg/dL (ref 0.57–1.00)
GFR calc Af Amer: 103 mL/min/{1.73_m2} (ref >59)
GFR calc non Af Amer: 90 mL/min/{1.73_m2} (ref >59)
Globulin, Total: 2.6 g/dL (ref 1.5–4.5)
Glucose: 126 mg/dL — ABNORMAL HIGH (ref 65–99)
Potassium: 4.2 mmol/L (ref 3.5–5.2)
Sodium: 139 mmol/L (ref 134–144)
TOTAL PROTEIN: 7.2 g/dL (ref 6.0–8.5)

## 2017-07-19 LAB — TSH: TSH: 2.13 u[IU]/mL (ref 0.450–4.500)

## 2017-07-19 LAB — CBC WITH DIFFERENTIAL
BASOS: 0 %
Basophils Absolute: 0 10*3/uL (ref 0.0–0.2)
EOS (ABSOLUTE): 0.1 10*3/uL (ref 0.0–0.4)
Eos: 2 %
HEMATOCRIT: 40.1 % (ref 34.0–46.6)
HEMOGLOBIN: 13.1 g/dL (ref 11.1–15.9)
IMMATURE GRANS (ABS): 0 10*3/uL (ref 0.0–0.1)
Immature Granulocytes: 0 %
LYMPHS: 29 %
Lymphocytes Absolute: 1.6 10*3/uL (ref 0.7–3.1)
MCH: 28.5 pg (ref 26.6–33.0)
MCHC: 32.7 g/dL (ref 31.5–35.7)
MCV: 87 fL (ref 79–97)
MONOCYTES: 7 %
Monocytes Absolute: 0.4 10*3/uL (ref 0.1–0.9)
NEUTROS ABS: 3.4 10*3/uL (ref 1.4–7.0)
Neutrophils: 62 %
RBC: 4.6 x10E6/uL (ref 3.77–5.28)
RDW: 13.4 % (ref 12.3–15.4)
WBC: 5.5 10*3/uL (ref 3.4–10.8)

## 2017-07-19 LAB — LIPID PANEL WITH LDL/HDL RATIO
CHOLESTEROL TOTAL: 180 mg/dL (ref 100–199)
HDL: 47 mg/dL (ref >39)
LDL Calculated: 88 mg/dL (ref 0–99)
LDl/HDL Ratio: 1.9 ratio (ref 0.0–3.2)
Triglycerides: 224 mg/dL — ABNORMAL HIGH (ref 0–149)
VLDL CHOLESTEROL CAL: 45 mg/dL — AB (ref 5–40)

## 2017-07-19 LAB — VITAMIN B12: Vitamin B-12: 316 pg/mL (ref 232–1245)

## 2017-07-19 LAB — T4, FREE: Free T4: 1.19 ng/dL (ref 0.82–1.77)

## 2017-07-19 LAB — HEMOGLOBIN A1C
ESTIMATED AVERAGE GLUCOSE: 154 mg/dL
Hgb A1c MFr Bld: 7 % — ABNORMAL HIGH (ref 4.8–5.6)

## 2017-07-19 LAB — INSULIN, RANDOM: INSULIN: 11.2 u[IU]/mL (ref 2.6–24.9)

## 2017-07-19 LAB — T3: T3, Total: 131 ng/dL (ref 71–180)

## 2017-07-19 LAB — VITAMIN D 25 HYDROXY (VIT D DEFICIENCY, FRACTURES): VIT D 25 HYDROXY: 38.4 ng/mL (ref 30.0–100.0)

## 2017-07-25 MED FILL — metFORMIN HCL 500 MG TABS: 500 | 90 days supply | Qty: 360 | Fill #1

## 2017-07-25 MED FILL — FARXIGA 10 MG TABLET: 10 | 90 days supply | Qty: 90 | Fill #1

## 2017-07-26 ENCOUNTER — Encounter: Payer: Self-pay | Admitting: Family Medicine

## 2017-07-27 ENCOUNTER — Other Ambulatory Visit: Payer: Self-pay

## 2017-07-27 ENCOUNTER — Ambulatory Visit (HOSPITAL_COMMUNITY): Payer: 59 | Attending: Cardiology

## 2017-07-27 DIAGNOSIS — Z87891 Personal history of nicotine dependence: Secondary | ICD-10-CM | POA: Insufficient documentation

## 2017-07-27 DIAGNOSIS — E119 Type 2 diabetes mellitus without complications: Secondary | ICD-10-CM | POA: Insufficient documentation

## 2017-07-27 DIAGNOSIS — Z8249 Family history of ischemic heart disease and other diseases of the circulatory system: Secondary | ICD-10-CM | POA: Insufficient documentation

## 2017-07-27 DIAGNOSIS — E785 Hyperlipidemia, unspecified: Secondary | ICD-10-CM | POA: Insufficient documentation

## 2017-07-27 DIAGNOSIS — I10 Essential (primary) hypertension: Secondary | ICD-10-CM | POA: Diagnosis not present

## 2017-07-27 DIAGNOSIS — R9431 Abnormal electrocardiogram [ECG] [EKG]: Secondary | ICD-10-CM | POA: Diagnosis not present

## 2017-08-01 ENCOUNTER — Ambulatory Visit (INDEPENDENT_AMBULATORY_CARE_PROVIDER_SITE_OTHER): Payer: 59 | Admitting: Family Medicine

## 2017-08-01 VITALS — BP 121/77 | HR 55 | Temp 97.8°F | Ht 67.0 in | Wt 178.0 lb

## 2017-08-01 DIAGNOSIS — Z9189 Other specified personal risk factors, not elsewhere classified: Secondary | ICD-10-CM | POA: Diagnosis not present

## 2017-08-01 DIAGNOSIS — E119 Type 2 diabetes mellitus without complications: Secondary | ICD-10-CM | POA: Diagnosis not present

## 2017-08-01 DIAGNOSIS — Z6830 Body mass index (BMI) 30.0-30.9, adult: Secondary | ICD-10-CM | POA: Diagnosis not present

## 2017-08-01 DIAGNOSIS — E559 Vitamin D deficiency, unspecified: Secondary | ICD-10-CM | POA: Diagnosis not present

## 2017-08-01 DIAGNOSIS — E669 Obesity, unspecified: Secondary | ICD-10-CM | POA: Diagnosis not present

## 2017-08-01 MED ORDER — LIRAGLUTIDE 18 MG/3ML ~~LOC~~ SOPN: 0.6000 mg | PEN_INJECTOR | SUBCUTANEOUS | 0 refills | Status: DC

## 2017-08-01 MED ORDER — INSULIN PEN NEEDLE 32G X 4 MM MISC: 1.0000 | 0 refills | Status: AC

## 2017-08-01 MED ORDER — VITAMIN D (ERGOCALCIFEROL) 1.25 MG (50000 UNIT) PO CAPS: 50000.0000 [IU] | ORAL_CAPSULE | ORAL | 1 refills | Status: DC

## 2017-08-01 MED FILL — VIT D2 1.25 MG (50,000 UNIT: 1.25 MG | 28 days supply | Qty: 4 | Fill #0

## 2017-08-01 MED FILL — VICTOZA 2-PAK 18 MG/3 ML PE: 18 | 60 days supply | Qty: 6 | Fill #0

## 2017-08-01 MED FILL — UNIFINE PENTIPS 32GX5/32": 32G X 4 MM | 90 days supply | Qty: 100 | Fill #0

## 2017-08-01 MED FILL — UNIFINE PENTIPS 32GX5/32: 32G X 4 MM | 90 days supply | Qty: 100 | Fill #0

## 2017-08-01 NOTE — Progress Notes (Signed)
Office: [phone]  /  Fax: [phone]   HPI:   Chief Complaint: OBESITY Tracy Anderson is here to discuss her progress with her obesity treatment plan. She is on the Category 2 plan and is following her eating plan approximately 50 % of the time. She states she is exercising 0 minutes 0 times per week. [name] did well on the category 2 plan with weight loss. She struggled with more temptations at home and notes feeling deprived when she made good choices. Her weight is 178 lb (80.7 kg) today and has had a weight loss of 6 pounds over a period of 2 weeks since her last visit. She has lost 6 lbs since starting treatment with [place].  Vitamin D deficiency Tracy Anderson has a diagnosis of vitamin D deficiency. She is not currently taking vit D and denies nausea, vomiting or muscle weakness.  Diabetes II Non Insulin [name] has a diagnosis of diabetes type II. [name] states fasting BGs range between 125 and 155 and 1 to 2 hour post prandial range between 108 and 206 on Farxiga and Metformin and she denies any hypoglycemic episodes but notes polyphagia, especially in the afternoons and evening. She has been working on intensive lifestyle modifications including diet, exercise, and weight loss to help control her blood glucose levels.  At risk for cardiovascular disease Tracy Anderson is at a higher than average risk for cardiovascular disease due to obesity and diabetes. She currently denies any chest pain.  ALLERGIES: Allergies  Allergen Reactions   Lisinopril Cough   Shrimp [Shellfish Allergy] Diarrhea    Severe stomach cramps and diarrhea   Clarithromycin Other (See Comments)    UNSPECIFIED     MEDICATIONS: Current Outpatient Prescriptions on File Prior to Visit  Medication Sig Dispense Refill   estradiol (ESTRACE VAGINAL) 0.1 MG/GM vaginal cream Place 1 Applicatorful vaginally at bedtime. (Patient taking differently: Place 1 Applicatorful vaginally at bedtime. 2 grams) 42.5 g 12    FARXIGA 10 MG TABS tablet Take 10 mg by mouth daily. 90 tablet 3   glucose blood (ACCU-CHEK GUIDE) test strip Use 2 X a day and diagnosis code is E 11.9 100 each 0   Lancets (ACCU-CHEK SOFT TOUCH) lancets Use 1 X a day and diagnosis code is E 11.9 100 each 0   losartan (COZAAR) 50 MG tablet Take 1 tablet (50 mg total) by mouth daily. 90 tablet 3   metFORMIN (GLUCOPHAGE) 500 MG tablet Take 2 tablets (1,000 mg total) by mouth 2 (two) times daily with a meal. 360 tablet 3   fluticasone (FLONASE) 50 MCG/ACT nasal spray Place 2 sprays into both nostrils daily. (Patient taking differently: Place 2 sprays into both nostrils daily as needed. ) 16 g 0   No current facility-administered medications on file prior to visit.     PAST MEDICAL HISTORY: Past Medical History:  Diagnosis Date   Arthritis    Asthma    as a child   Back pain    Depression    Diabetes mellitus type II    Herpes genitalia    Hyperlipidemia    Hypertension    Shellfish allergy    Shingles 2012   right flank    PAST SURGICAL HISTORY: Past Surgical History:  Procedure Laterality Date   CHOLECYSTECTOMY N/A 07/05/2016   Procedure: LAPAROSCOPIC CHOLECYSTECTOMY;  Surgeon: Georganna Skeans, MD;  Location: [facility];  Service: General;  Laterality: N/A;   colonscopy  05/20/09   per Dr. Deatra Ina, hyperplstic polyps, repeat in 10 yers  TYMPANOSTOMY TUBE PLACEMENT      SOCIAL HISTORY: Social History  Substance Use Topics   Smoking status: Former Smoker    Quit date: 05/05/1991   Smokeless tobacco: Never Used   Alcohol use 1.2 oz/week    2 Standard drinks or equivalent per week     Comment: occ    FAMILY HISTORY: Family History  Problem Relation Age of Onset   Arthritis Other    Cancer Other        cervical   Depression Other    Diabetes Other    Ulcers Other    Breast cancer Maternal Grandmother    Diabetes Mother    Heart disease Mother    Cancer Mother    Depression Mother     Alcoholism Mother    Eating disorder Mother    Diabetes Father    Hypertension Father    Depression Father    Alcoholism Father     ROS: Review of Systems  Constitutional: Positive for weight loss.  Cardiovascular: Negative for chest pain.  Gastrointestinal: Negative for nausea and vomiting.  Musculoskeletal:       Negative muscle weakness  Endo/Heme/Allergies:       Positive polyphagia Negative hypoglycemia    PHYSICAL EXAM: Blood pressure 121/77, pulse (!) 55, temperature 97.8 F (36.6 C), temperature source Oral, height 5\' 7"  (1.702 m), weight 178 lb (80.7 kg), last menstrual period 08/08/2012, SpO2 100 %. Body mass index is 27.88 kg/m. Physical Exam  Constitutional: She is oriented to person, place, and time. She appears well-developed and well-nourished.  Cardiovascular: Normal rate.   Pulmonary/Chest: Effort normal.  Musculoskeletal: Normal range of motion.  Neurological: She is oriented to person, place, and time.  Skin: Skin is warm and dry.  Psychiatric: She has a normal mood and affect. Her behavior is normal.  Vitals reviewed.   RECENT LABS AND TESTS: BMET    Component Value Date/Time   NA 139 07/18/2017 0815   K 4.2 07/18/2017 0815   CL 99 07/18/2017 0815   CO2 20 07/18/2017 0815   GLUCOSE 126 (H) 07/18/2017 0815   GLUCOSE 127 (H) 04/23/2017 0925   BUN 19 07/18/2017 0815   CREATININE 0.74 07/18/2017 0815   CALCIUM 9.4 07/18/2017 0815   GFRNONAA 90 07/18/2017 0815   GFRAA 103 07/18/2017 0815   Lab Results  Component Value Date   HGBA1C 7.0 (H) 07/18/2017   HGBA1C 7.6 (H) 04/23/2017   HGBA1C 7.5 (H) 01/11/2017   HGBA1C 7.8 (H) 10/02/2016   HGBA1C 8.4 (H) 06/27/2016   Lab Results  Component Value Date   INSULIN 11.2 07/18/2017   CBC    Component Value Date/Time   WBC 5.5 07/18/2017 0815   WBC 6.2 04/23/2017 0925   RBC 4.60 07/18/2017 0815   RBC 4.78 04/23/2017 0925   HGB 13.1 07/18/2017 0815   HCT 40.1 07/18/2017 0815   PLT 250.0  04/23/2017 0925   MCV 87 07/18/2017 0815   MCH 28.5 07/18/2017 0815   MCH 28.2 06/27/2016 0906   MCHC 32.7 07/18/2017 0815   MCHC 32.8 04/23/2017 0925   RDW 13.4 07/18/2017 0815   LYMPHSABS 1.6 07/18/2017 0815   MONOABS 0.4 04/23/2017 0925   EOSABS 0.1 07/18/2017 0815   BASOSABS 0.0 07/18/2017 0815   Iron/TIBC/Ferritin/ %Sat No results found for: IRON, TIBC, FERRITIN, IRONPCTSAT Lipid Panel     Component Value Date/Time   CHOL 180 07/18/2017 0815   TRIG 224 (H) 07/18/2017 0815   HDL 47 07/18/2017  0815   CHOLHDL 4 04/23/2017 0925   VLDL 47.2 (H) 04/23/2017 0925   LDLCALC 88 07/18/2017 0815   LDLDIRECT 82.0 04/23/2017 0925   Hepatic Function Panel     Component Value Date/Time   PROT 7.2 07/18/2017 0815   ALBUMIN 4.6 07/18/2017 0815   AST 18 07/18/2017 0815   ALT 25 07/18/2017 0815   ALKPHOS 84 07/18/2017 0815   BILITOT 0.4 07/18/2017 0815   BILIDIR 0.1 04/23/2017 0925      Component Value Date/Time   TSH 2.130 07/18/2017 0815   TSH 1.15 04/23/2017 0925   TSH 1.77 03/21/2016 0732    ASSESSMENT AND PLAN: Vitamin D deficiency - Plan: Vitamin D, Ergocalciferol, (DRISDOL) 50000 units CAPS capsule  Diabetes mellitus type II, non insulin dependent ([place]) - Plan: liraglutide (VICTOZA) 18 MG/3ML SOPN, Insulin Pen Needle (BD PEN NEEDLE NANO U/F) 32G X 4 MM MISC  At risk for heart disease  Class 1 obesity with serious comorbidity and body mass index (BMI) of 30.0 to 30.9 in adult, unspecified obesity type - Starting BMI greater than 30  PLAN:  Vitamin D Deficiency Nicolas was informed that low vitamin D levels contributes to fatigue and are associated with obesity, breast, and colon cancer. She agrees to start to take prescription Vit D @50 ,000 IU every week #4 with no refills and will follow up for routine testing of vitamin D, at least 2-3 times per year. She was informed of the risk of over-replacement of vitamin D and agrees to not increase her dose unless he discusses  this with [place] first. Jarrah agrees to follow up with our clinic in 2 weeks.  Diabetes II Non Insulin Youlanda has been given extensive diabetes education by myself today including ideal fasting and post-prandial blood glucose readings, individual ideal Hgb A1c goals  and hypoglycemia prevention. We discussed the importance of good blood sugar control to decrease the likelihood of diabetic complications such as nephropathy, neuropathy, limb loss, blindness, coronary artery disease, and death. We discussed the importance of intensive lifestyle modification including diet, exercise and weight loss as the first line treatment for diabetes. Simon agrees to start Victoza 0.6 mg every morning #1 pen with no refills and nano needles 1 pack with no refills and she will follow up at the agreed upon time.  Cardiovascular risk counseling Laquiesha was given extended (30 minutes) coronary artery disease prevention counseling today. She is 56 y.o. female and has risk factors for heart disease including obesity and diabetes. We discussed intensive lifestyle modifications today with an emphasis on specific weight loss instructions and strategies. Pt was also informed of the importance of increasing exercise and decreasing saturated fats to help prevent heart disease.  Obesity Dinorah is currently in the action stage of change. As such, her goal is to continue with weight loss efforts She has agreed to follow the Category 2 plan +100 calories Shawn has been instructed to work up to a goal of 150 minutes of combined cardio and strengthening exercise per week for weight loss and overall health benefits. We discussed the following Behavioral Modification Strategies today: keeping healthy foods in the home, increasing lean protein intake, decreasing simple carbohydrates and decrease eating out  Zamya has agreed to follow up with our clinic in 2 weeks. She was informed of the importance of frequent follow up  visits to maximize her success with intensive lifestyle modifications for her multiple health conditions.  Corey Skains, am acting as transcriptionist for Dennard Nip, MD  I have reviewed the above documentation for accuracy and completeness, and I agree with the above. -Dennard Nip, MD   OBESITY BEHAVIORAL INTERVENTION VISIT  Today's visit was # 2 out of 22.  Starting weight: 184 lbs Starting date: 07/17/17 Today's weight : 178 lbs Today's date: 08/01/2017 Total lbs lost to date: 6 (Patients must lose 7 lbs in the first 6 months to continue with counseling)   ASK: We discussed the diagnosis of obesity with Yolonda Kida today and Joelene Millin agreed to give [place] permission to discuss obesity behavioral modification therapy today.  ASSESS: Carlynn has the diagnosis of obesity and her BMI today is 27.87 Caretha is in the action stage of change   ADVISE: Millie was educated on the multiple health risks of obesity as well as the benefit of weight loss to improve her health. She was advised of the need for long term treatment and the importance of lifestyle modifications.  AGREE: Multiple dietary modification options and treatment options were discussed and  Saphronia agreed to follow the Category 2 plan +100 calories We discussed the following Behavioral Modification Strategies today: keeping healthy foods in the home, increasing lean protein intake, decreasing simple carbohydrates and decrease eating out

## 2017-08-15 ENCOUNTER — Ambulatory Visit (INDEPENDENT_AMBULATORY_CARE_PROVIDER_SITE_OTHER): Payer: 59 | Admitting: Family Medicine

## 2017-08-15 VITALS — BP 101/69 | HR 77 | Temp 97.8°F | Ht 67.0 in | Wt 177.0 lb

## 2017-08-15 DIAGNOSIS — E119 Type 2 diabetes mellitus without complications: Secondary | ICD-10-CM

## 2017-08-15 DIAGNOSIS — Z6830 Body mass index (BMI) 30.0-30.9, adult: Secondary | ICD-10-CM | POA: Diagnosis not present

## 2017-08-15 DIAGNOSIS — E669 Obesity, unspecified: Secondary | ICD-10-CM | POA: Diagnosis not present

## 2017-08-16 NOTE — Progress Notes (Signed)
Office: [phone]  /  Fax: [phone]   HPI:   Chief Complaint: OBESITY Tracy Anderson is here to discuss her progress with her obesity treatment plan. She is on the Category 2 plan and is following her eating plan approximately 80 % of the time. She states she is exercising 0 minutes 0 times per week. Tracy Anderson has done well with weight loss in the last 2 weeks. Hunger is better controlled and she is working on meal plans. She is getting ready to visit family in 1 week and would like to discuss travel strategies. Her weight is 177 lb (80.3 kg) today and has had a weight loss of 1 pound over a period of 2 weeks since her last visit. She has lost 7 lbs since starting treatment with [place].  Diabetes II Tracy Anderson has a diagnosis of diabetes type II. Tracy Anderson states fasting BGs range between 97 and 157, mostly between 116 and 120 and denies any hypoglycemic episodes. She had some mild GI upset but this is improving. She has been working on intensive lifestyle modifications including diet, exercise, and weight loss to help control her blood glucose levels.   ALLERGIES: Allergies  Allergen Reactions  . Lisinopril Cough  . Shrimp [Shellfish Allergy] Diarrhea    Severe stomach cramps and diarrhea  . Clarithromycin Other (See Comments)    UNSPECIFIED     MEDICATIONS: Current Outpatient Prescriptions on File Prior to Visit  Medication Sig Dispense Refill  . estradiol (ESTRACE VAGINAL) 0.1 MG/GM vaginal cream Place 1 Applicatorful vaginally at bedtime. (Patient taking differently: Place 1 Applicatorful vaginally at bedtime. 2 grams) 42.5 g 12  . FARXIGA 10 MG TABS tablet Take 10 mg by mouth daily. 90 tablet 3  . fluticasone (FLONASE) 50 MCG/ACT nasal spray Place 2 sprays into both nostrils daily. (Patient taking differently: Place 2 sprays into both nostrils daily as needed. ) 16 g 0  . glucose blood (ACCU-CHEK GUIDE) test strip Use 2 X a day and diagnosis code is E 11.9 100 each 0  . Lancets  (ACCU-CHEK SOFT TOUCH) lancets Use 1 X a day and diagnosis code is E 11.9 100 each 0  . liraglutide (VICTOZA) 18 MG/3ML SOPN Inject 0.1 mLs (0.6 mg total) into the skin 1 day or 1 dose. 1 pen 0  . losartan (COZAAR) 50 MG tablet Take 1 tablet (50 mg total) by mouth daily. 90 tablet 3  . metFORMIN (GLUCOPHAGE) 500 MG tablet Take 2 tablets (1,000 mg total) by mouth 2 (two) times daily with a meal. (Patient taking differently: Take 1,000 mg by mouth 2 (two) times daily with a meal. Take 500mg  Qam and 1000mg  Qpm) 360 tablet 3  . Vitamin D, Ergocalciferol, (DRISDOL) 50000 units CAPS capsule Take 1 capsule (50,000 Units total) by mouth every 7 (seven) days. 4 capsule 1   No current facility-administered medications on file prior to visit.     PAST MEDICAL HISTORY: Past Medical History:  Diagnosis Date  . Arthritis   . Asthma    as a child  . Back pain   . Depression   . Diabetes mellitus type II   . Herpes genitalia   . Hyperlipidemia   . Hypertension   . Shellfish allergy   . Shingles 2012   right flank    PAST SURGICAL HISTORY: Past Surgical History:  Procedure Laterality Date  . CHOLECYSTECTOMY N/A 07/05/2016   Procedure: LAPAROSCOPIC CHOLECYSTECTOMY;  Surgeon: Georganna Skeans, MD;  Location: [facility];  Service: General;  Laterality: N/A;  .  colonscopy  05/20/09   per Dr. Deatra Ina, hyperplstic polyps, repeat in 10 yers   . TYMPANOSTOMY TUBE PLACEMENT      SOCIAL HISTORY: Social History  Substance Use Topics  . Smoking status: Former Smoker    Quit date: 05/05/1991  . Smokeless tobacco: Never Used  . Alcohol use 1.2 oz/week    2 Standard drinks or equivalent per week     Comment: occ    FAMILY HISTORY: Family History  Problem Relation Age of Onset  . Arthritis Other   . Cancer Other        cervical  . Depression Other   . Diabetes Other   . Ulcers Other   . Breast cancer Maternal Grandmother   . Diabetes Mother   . Heart disease Mother   . Cancer Mother   . Depression  Mother   . Alcoholism Mother   . Eating disorder Mother   . Diabetes Father   . Hypertension Father   . Depression Father   . Alcoholism Father     ROS: Review of Systems  Constitutional: Positive for weight loss.  Gastrointestinal: Positive for diarrhea and nausea.    PHYSICAL EXAM: Blood pressure 101/69, pulse 77, temperature 97.8 F (36.6 C), temperature source Oral, height 5\' 7"  (1.702 m), weight 177 lb (80.3 kg), last menstrual period 08/08/2012, SpO2 100 %. Body mass index is 27.72 kg/m. Physical Exam  Constitutional: She is oriented to person, place, and time. She appears well-developed and well-nourished.  Cardiovascular: Normal rate.   Pulmonary/Chest: Effort normal.  Musculoskeletal: Normal range of motion.  Neurological: She is oriented to person, place, and time.  Skin: Skin is warm and dry.  Psychiatric: She has a normal mood and affect. Her behavior is normal.  Vitals reviewed.   RECENT LABS AND TESTS: BMET    Component Value Date/Time   NA 139 07/18/2017 0815   K 4.2 07/18/2017 0815   CL 99 07/18/2017 0815   CO2 20 07/18/2017 0815   GLUCOSE 126 (H) 07/18/2017 0815   GLUCOSE 127 (H) 04/23/2017 0925   BUN 19 07/18/2017 0815   CREATININE 0.74 07/18/2017 0815   CALCIUM 9.4 07/18/2017 0815   GFRNONAA 90 07/18/2017 0815   GFRAA 103 07/18/2017 0815   Lab Results  Component Value Date   HGBA1C 7.0 (H) 07/18/2017   HGBA1C 7.6 (H) 04/23/2017   HGBA1C 7.5 (H) 01/11/2017   HGBA1C 7.8 (H) 10/02/2016   HGBA1C 8.4 (H) 06/27/2016   Lab Results  Component Value Date   INSULIN 11.2 07/18/2017   CBC    Component Value Date/Time   WBC 5.5 07/18/2017 0815   WBC 6.2 04/23/2017 0925   RBC 4.60 07/18/2017 0815   RBC 4.78 04/23/2017 0925   HGB 13.1 07/18/2017 0815   HCT 40.1 07/18/2017 0815   PLT 250.0 04/23/2017 0925   MCV 87 07/18/2017 0815   MCH 28.5 07/18/2017 0815   MCH 28.2 06/27/2016 0906   MCHC 32.7 07/18/2017 0815   MCHC 32.8 04/23/2017 0925    RDW 13.4 07/18/2017 0815   LYMPHSABS 1.6 07/18/2017 0815   MONOABS 0.4 04/23/2017 0925   EOSABS 0.1 07/18/2017 0815   BASOSABS 0.0 07/18/2017 0815   Iron/TIBC/Ferritin/ %Sat No results found for: IRON, TIBC, FERRITIN, IRONPCTSAT Lipid Panel     Component Value Date/Time   CHOL 180 07/18/2017 0815   TRIG 224 (H) 07/18/2017 0815   HDL 47 07/18/2017 0815   CHOLHDL 4 04/23/2017 0925   VLDL 47.2 (H) 04/23/2017  0925   LDLCALC 88 07/18/2017 0815   LDLDIRECT 82.0 04/23/2017 0925   Hepatic Function Panel     Component Value Date/Time   PROT 7.2 07/18/2017 0815   ALBUMIN 4.6 07/18/2017 0815   AST 18 07/18/2017 0815   ALT 25 07/18/2017 0815   ALKPHOS 84 07/18/2017 0815   BILITOT 0.4 07/18/2017 0815   BILIDIR 0.1 04/23/2017 0925      Component Value Date/Time   TSH 2.130 07/18/2017 0815   TSH 1.15 04/23/2017 0925   TSH 1.77 03/21/2016 0732    ASSESSMENT AND PLAN: Type 2 diabetes mellitus without complication, without long-term current use of insulin ([facility])  Class 1 obesity with serious comorbidity and body mass index (BMI) of 30.0 to 30.9 in adult, unspecified obesity type - Starting BMI greater than 30  PLAN:  Diabetes II Tracy Anderson has been given extensive diabetes education by myself today including ideal fasting and post-prandial blood glucose readings, individual ideal Hgb A1c goals  and hypoglycemia prevention. We discussed the importance of good blood sugar control to decrease the likelihood of diabetic complications such as nephropathy, neuropathy, limb loss, blindness, coronary artery disease, and death. We discussed the importance of intensive lifestyle modification including diet, exercise and weight loss as the first line treatment for diabetes. Tracy Anderson agrees to change her metformin to 500 mg qam and 1,0000 mg qpm (no refill needed) and continue victoza. Tracy Anderson agreed to follow up with our clinic in 2 weeks.   Obesity Tracy Anderson is currently in the action stage of  change. As such, her goal is to continue with weight loss efforts She has agreed to follow the Category 2 plan Tracy Anderson has been instructed to work up to a goal of 150 minutes of combined cardio and strengthening exercise per week for weight loss and overall health benefits. We discussed the following Behavioral Modification Strategies today: increasing lean protein intake, decreasing simple carbohydrates , decrease eating out, work on meal planning and easy cooking plans and travel eating strategies   Tracy Anderson has agreed to follow up with our clinic in 2 weeks. She was informed of the importance of frequent follow up visits to maximize her success with intensive lifestyle modifications for her multiple health conditions.  I, Tracy Anderson, am acting as transcriptionist for Dennard Nip, MD  I have reviewed the above documentation for accuracy and completeness, and I agree with the above. -Dennard Nip, MD  OBESITY BEHAVIORAL INTERVENTION VISIT  Today's visit was # 3 out of 22.  Starting weight: 184 lbs Starting date: 07/17/17 Today's weight : 177 lbs  Today's date: 08/15/2017 Total lbs lost to date: 7 (Patients must lose 7 lbs in the first 6 months to continue with counseling)   ASK: We discussed the diagnosis of obesity with Tracy Anderson today and Tracy Anderson agreed to give [place] permission to discuss obesity behavioral modification therapy today.  ASSESS: Tracy Anderson has the diagnosis of obesity and her BMI today is 27.72 Tracy Anderson is in the action stage of change   ADVISE: Tracy Anderson was educated on the multiple health risks of obesity as well as the benefit of weight loss to improve her health. She was advised of the need for long term treatment and the importance of lifestyle modifications.  AGREE: Multiple dietary modification options and treatment options were discussed and  Tracy Anderson agreed to follow the Category 2 plan We discussed the following Behavioral Modification  Strategies today: increasing lean protein intake, decreasing simple carbohydrates, decrease eating out, work on meal planning and  easy cooking plans and travel eating strategies

## 2017-08-30 ENCOUNTER — Ambulatory Visit (INDEPENDENT_AMBULATORY_CARE_PROVIDER_SITE_OTHER): Payer: 59 | Admitting: Physician Assistant

## 2017-08-30 VITALS — BP 135/77 | HR 73 | Temp 97.8°F | Ht 67.0 in | Wt 181.0 lb

## 2017-08-30 DIAGNOSIS — I10 Essential (primary) hypertension: Secondary | ICD-10-CM

## 2017-08-30 DIAGNOSIS — E669 Obesity, unspecified: Secondary | ICD-10-CM

## 2017-08-30 DIAGNOSIS — Z6830 Body mass index (BMI) 30.0-30.9, adult: Secondary | ICD-10-CM | POA: Diagnosis not present

## 2017-08-30 MED FILL — LOSARTAN POTASSIUM 50 MG TA: 50 | 90 days supply | Qty: 90 | Fill #1

## 2017-08-30 MED FILL — VIT D2 1.25 MG (50,000 UNIT: 1.25 MG | 28 days supply | Qty: 4 | Fill #1

## 2017-08-30 NOTE — Progress Notes (Signed)
Office: [phone]  /  Fax: [phone]   HPI:   Chief Complaint: OBESITY Tracy Anderson is here to discuss her progress with her obesity treatment plan. She is on the Category 2 plan and is following her eating plan approximately 25 % of the time. She states she is exercising 0 minutes 0 times per week. Tracy Anderson was on vacation and found it hard to follow the plan. She is motivated to get back on track and continue weight loss. She would like more variety at dinner.Marland Kitchen  Her weight is 181 lb (82.1 kg) today and has gained 4 pounds since her last visit. She has lost 3 lbs since starting treatment with [place].  Hypertension Tracy Anderson is a 56 y.o. female with hypertension. She has ran out of blood pressure medicine and hasn't taken it for a few days. Tracy Anderson denies chest pain or shortness of breath. She is working weight loss to help control her blood pressure with the goal of decreasing her risk of heart attack and stroke. Tracy Anderson blood pressure is stable.  ALLERGIES: Allergies  Allergen Reactions  . Lisinopril Cough  . Shrimp [Shellfish Allergy] Diarrhea    Severe stomach cramps and diarrhea  . Clarithromycin Other (See Comments)    UNSPECIFIED     MEDICATIONS: Current Outpatient Prescriptions on File Prior to Visit  Medication Sig Dispense Refill  . estradiol (ESTRACE VAGINAL) 0.1 MG/GM vaginal cream Place 1 Applicatorful vaginally at bedtime. (Patient taking differently: Place 1 Applicatorful vaginally at bedtime. 2 grams) 42.5 g 12  . FARXIGA 10 MG TABS tablet Take 10 mg by mouth daily. 90 tablet 3  . glucose blood (ACCU-CHEK GUIDE) test strip Use 2 X a day and diagnosis code is E 11.9 100 each 0  . Lancets (ACCU-CHEK SOFT TOUCH) lancets Use 1 X a day and diagnosis code is E 11.9 100 each 0  . losartan (COZAAR) 50 MG tablet Take 1 tablet (50 mg total) by mouth daily. 90 tablet 3  . metFORMIN (GLUCOPHAGE) 500 MG tablet Take 2 tablets (1,000 mg total) by mouth 2  (two) times daily with a meal. (Patient taking differently: Take 1,000 mg by mouth 2 (two) times daily with a meal. Take 500mg  Qam and 1000mg  Qpm) 360 tablet 3  . Vitamin D, Ergocalciferol, (DRISDOL) 50000 units CAPS capsule Take 1 capsule (50,000 Units total) by mouth every 7 (seven) days. 4 capsule 1  . fluticasone (FLONASE) 50 MCG/ACT nasal spray Place 2 sprays into both nostrils daily. (Patient taking differently: Place 2 sprays into both nostrils daily as needed. ) 16 g 0  . liraglutide (VICTOZA) 18 MG/3ML SOPN Inject 0.1 mLs (0.6 mg total) into the skin 1 day or 1 dose. 1 pen 0   No current facility-administered medications on file prior to visit.     PAST MEDICAL HISTORY: Past Medical History:  Diagnosis Date  . Arthritis   . Asthma    as a child  . Back pain   . Depression   . Diabetes mellitus type II   . Herpes genitalia   . Hyperlipidemia   . Hypertension   . Shellfish allergy   . Shingles 2012   right flank    PAST SURGICAL HISTORY: Past Surgical History:  Procedure Laterality Date  . CHOLECYSTECTOMY N/A 07/05/2016   Procedure: LAPAROSCOPIC CHOLECYSTECTOMY;  Surgeon: Georganna Skeans, MD;  Location: [facility];  Service: General;  Laterality: N/A;  . colonscopy  05/20/09   per Dr. Deatra Ina, hyperplstic polyps, repeat in  10 yers   . TYMPANOSTOMY TUBE PLACEMENT      SOCIAL HISTORY: Social History  Substance Use Topics  . Smoking status: Former Smoker    Quit date: 05/05/1991  . Smokeless tobacco: Never Used  . Alcohol use 1.2 oz/week    2 Standard drinks or equivalent per week     Comment: occ    FAMILY HISTORY: Family History  Problem Relation Age of Onset  . Arthritis Other   . Cancer Other        cervical  . Depression Other   . Diabetes Other   . Ulcers Other   . Breast cancer Maternal Grandmother   . Diabetes Mother   . Heart disease Mother   . Cancer Mother   . Depression Mother   . Alcoholism Mother   . Eating disorder Mother   . Diabetes Father   .  Hypertension Father   . Depression Father   . Alcoholism Father     ROS: Review of Systems  Constitutional: Negative for weight loss.  Respiratory: Negative for shortness of breath.   Cardiovascular: Negative for chest pain.    PHYSICAL EXAM: Blood pressure 135/77, pulse 73, temperature 97.8 F (36.6 C), temperature source Oral, height 5\' 7"  (1.702 m), weight 181 lb (82.1 kg), last menstrual period 08/08/2012, SpO2 100 %. Body mass index is 28.35 kg/m. Physical Exam  Constitutional: She is oriented to person, place, and time. She appears well-developed and well-nourished.  Cardiovascular: Normal rate.   Pulmonary/Chest: Effort normal.  Musculoskeletal: Normal range of motion.  Neurological: She is oriented to person, place, and time.  Skin: Skin is warm and dry.  Psychiatric: She has a normal mood and affect. Her behavior is normal.  Vitals reviewed.   RECENT LABS AND TESTS: BMET    Component Value Date/Time   NA 139 07/18/2017 0815   K 4.2 07/18/2017 0815   CL 99 07/18/2017 0815   CO2 20 07/18/2017 0815   GLUCOSE 126 (H) 07/18/2017 0815   GLUCOSE 127 (H) 04/23/2017 0925   BUN 19 07/18/2017 0815   CREATININE 0.74 07/18/2017 0815   CALCIUM 9.4 07/18/2017 0815   GFRNONAA 90 07/18/2017 0815   GFRAA 103 07/18/2017 0815   Lab Results  Component Value Date   HGBA1C 7.0 (H) 07/18/2017   HGBA1C 7.6 (H) 04/23/2017   HGBA1C 7.5 (H) 01/11/2017   HGBA1C 7.8 (H) 10/02/2016   HGBA1C 8.4 (H) 06/27/2016   Lab Results  Component Value Date   INSULIN 11.2 07/18/2017   CBC    Component Value Date/Time   WBC 5.5 07/18/2017 0815   WBC 6.2 04/23/2017 0925   RBC 4.60 07/18/2017 0815   RBC 4.78 04/23/2017 0925   HGB 13.1 07/18/2017 0815   HCT 40.1 07/18/2017 0815   PLT 250.0 04/23/2017 0925   MCV 87 07/18/2017 0815   MCH 28.5 07/18/2017 0815   MCH 28.2 06/27/2016 0906   MCHC 32.7 07/18/2017 0815   MCHC 32.8 04/23/2017 0925   RDW 13.4 07/18/2017 0815   LYMPHSABS 1.6  07/18/2017 0815   MONOABS 0.4 04/23/2017 0925   EOSABS 0.1 07/18/2017 0815   BASOSABS 0.0 07/18/2017 0815   Iron/TIBC/Ferritin/ %Sat No results found for: IRON, TIBC, FERRITIN, IRONPCTSAT Lipid Panel     Component Value Date/Time   CHOL 180 07/18/2017 0815   TRIG 224 (H) 07/18/2017 0815   HDL 47 07/18/2017 0815   CHOLHDL 4 04/23/2017 0925   VLDL 47.2 (H) 04/23/2017 0925   LDLCALC 88  07/18/2017 0815   LDLDIRECT 82.0 04/23/2017 0925   Hepatic Function Panel     Component Value Date/Time   PROT 7.2 07/18/2017 0815   ALBUMIN 4.6 07/18/2017 0815   AST 18 07/18/2017 0815   ALT 25 07/18/2017 0815   ALKPHOS 84 07/18/2017 0815   BILITOT 0.4 07/18/2017 0815   BILIDIR 0.1 04/23/2017 0925      Component Value Date/Time   TSH 2.130 07/18/2017 0815   TSH 1.15 04/23/2017 0925   TSH 1.77 03/21/2016 0732    ASSESSMENT AND PLAN: Essential hypertension  Class 1 obesity with serious comorbidity and body mass index (BMI) of 30.0 to 30.9 in adult, unspecified obesity type - starting BMI of over 30  PLAN:  Hypertension We discussed sodium restriction, working on healthy weight loss, and a regular exercise program as the means to achieve improved blood pressure control. Tracy Anderson agreed with this plan and agreed to follow up as directed. We will continue to monitor her blood pressure as well as her progress with the above lifestyle modifications. She agrees to continue her medications as prescribed and will watch for signs of hypotension as she continues her lifestyle modifications. Tracy Anderson agrees to follow up with our clinic in 2 weeks.  We spent > than 50% of the 15 minute visit on the counseling as documented in the note.  Obesity Tracy Anderson is currently in the action stage of change. As such, her goal is to continue with weight loss efforts She has agreed to change to keep a food journal with 500 calories and 40 grams of protein at supper daily and follow the Category 2 plan Tracy Anderson  has been instructed to work up to a goal of 150 minutes of combined cardio and strengthening exercise per week for weight loss and overall health benefits. We discussed the following Behavioral Modification Strategies today: increasing lean protein intake and work on meal planning and easy cooking plans   Tracy Anderson has agreed to follow up with our clinic in 2 weeks. She was informed of the importance of frequent follow up visits to maximize her success with intensive lifestyle modifications for her multiple health conditions.  I, Trixie Dredge, am acting as transcriptionist for Lacy Duverney, PA-C  I have reviewed the above documentation for accuracy and completeness, and I agree with the above. -Lacy Duverney, PA-C  I have reviewed the above note and agree with the plan. -Dennard Nip, MD     Today's visit was # 4 out of 22.  Starting weight: 184 lbs Starting date: 07/17/17 Today's weight : 181 lbs  Today's date: 08/30/2017 Total lbs lost to date: 3 (Patients must lose 7 lbs in the first 6 months to continue with counseling)   ASK: We discussed the diagnosis of obesity with Tracy Anderson today and Tracy Anderson agreed to give [place] permission to discuss obesity behavioral modification therapy today.  ASSESS: Tracy Anderson has the diagnosis of obesity and her BMI today is 28.34 Tracy Anderson is in the action stage of change   ADVISE: Tracy Anderson was educated on the multiple health risks of obesity as well as the benefit of weight loss to improve her health. She was advised of the need for long term treatment and the importance of lifestyle modifications.  AGREE: Multiple dietary modification options and treatment options were discussed and  Tracy Anderson agreed to keep a food journal with 500 calories and 40 grams of protein at supper daily and follow the Category 2 plan We discussed the following Behavioral Modification Strategies today:  increasing lean protein intake and work on meal planning and easy  cooking plans

## 2017-09-13 ENCOUNTER — Ambulatory Visit (INDEPENDENT_AMBULATORY_CARE_PROVIDER_SITE_OTHER): Payer: 59 | Admitting: Physician Assistant

## 2017-09-13 VITALS — BP 132/76 | HR 90 | Temp 97.6°F | Ht 67.0 in | Wt 179.0 lb

## 2017-09-13 DIAGNOSIS — Z6830 Body mass index (BMI) 30.0-30.9, adult: Secondary | ICD-10-CM | POA: Diagnosis not present

## 2017-09-13 DIAGNOSIS — Z9189 Other specified personal risk factors, not elsewhere classified: Secondary | ICD-10-CM

## 2017-09-13 DIAGNOSIS — E559 Vitamin D deficiency, unspecified: Secondary | ICD-10-CM

## 2017-09-13 DIAGNOSIS — I10 Essential (primary) hypertension: Secondary | ICD-10-CM | POA: Diagnosis not present

## 2017-09-13 DIAGNOSIS — E669 Obesity, unspecified: Secondary | ICD-10-CM | POA: Diagnosis not present

## 2017-09-13 MED ORDER — VITAMIN D (ERGOCALCIFEROL) 1.25 MG (50000 UNIT) PO CAPS: 50000.0000 [IU] | ORAL_CAPSULE | ORAL | 1 refills | Status: DC

## 2017-09-13 NOTE — Progress Notes (Signed)
Office: [phone]  /  Fax: [phone]   HPI:   Chief Complaint: OBESITY Tracy Anderson is here to discuss her progress with her obesity treatment plan. She is on the Category 2 plan and is following her eating plan approximately 75 % of the time. She states she is exercising 0 minutes 0 times per week. Jalynne continues to do well with weight loss. She plans her meals well and states hunger is well controlled. She would like more variety at her meals. Her weight is 179 lb (81.2 kg) today and has had a weight loss of 2 pounds over a period of 2 weeks since her last visit. She has lost 5 lbs since starting treatment with [place].  Vitamin D deficiency Tracy Anderson has a diagnosis of vitamin D deficiency. She is currently taking vit D and denies nausea, vomiting or muscle weakness.  Hypertension Tracy Anderson is a 56 y.o. female with hypertension.  Tracy Anderson denies chest pain or shortness of breath on exertion. She is working weight loss to help control her blood pressure with the goal of decreasing her risk of heart attack and stroke. Tracy Anderson blood pressure is currently stable.  At risk for cardiovascular disease Tracy Anderson is at a higher than average risk for cardiovascular disease due to obesity and hypertension. She currently denies any chest pain.   ALLERGIES: Allergies  Allergen Reactions  . Lisinopril Cough  . Shrimp [Shellfish Allergy] Diarrhea    Severe stomach cramps and diarrhea  . Clarithromycin Other (See Comments)    UNSPECIFIED     MEDICATIONS: Current Outpatient Medications on File Prior to Visit  Medication Sig Dispense Refill  . estradiol (ESTRACE VAGINAL) 0.1 MG/GM vaginal cream Place 1 Applicatorful vaginally at bedtime. (Patient taking differently: Place 1 Applicatorful vaginally at bedtime. 2 grams) 42.5 g 12  . FARXIGA 10 MG TABS tablet Take 10 mg by mouth daily. 90 tablet 3  . glucose blood (ACCU-CHEK GUIDE) test strip Use 2 X a day and diagnosis  code is E 11.9 100 each 0  . Lancets (ACCU-CHEK SOFT TOUCH) lancets Use 1 X a day and diagnosis code is E 11.9 (Patient taking differently: Use 2 X a day and diagnosis code is E 11.9) 100 each 0  . liraglutide (VICTOZA) 18 MG/3ML SOPN Inject 0.1 mLs (0.6 mg total) into the skin 1 day or 1 dose. 1 pen 0  . losartan (COZAAR) 50 MG tablet Take 1 tablet (50 mg total) by mouth daily. 90 tablet 3  . metFORMIN (GLUCOPHAGE) 500 MG tablet Take 2 tablets (1,000 mg total) by mouth 2 (two) times daily with a meal. (Patient taking differently: Take 1,000 mg by mouth 2 (two) times daily with a meal. Take 500mg  Qam and 1000mg  Qpm) 360 tablet 3   No current facility-administered medications on file prior to visit.     PAST MEDICAL HISTORY: Past Medical History:  Diagnosis Date  . Arthritis   . Asthma    as a child  . Back pain   . Depression   . Diabetes mellitus type II   . Herpes genitalia   . Hyperlipidemia   . Hypertension   . Shellfish allergy   . Shingles 2012   right flank    PAST SURGICAL HISTORY: Past Surgical History:  Procedure Laterality Date  . colonscopy  05/20/09   per Dr. Deatra Ina, hyperplstic polyps, repeat in 10 yers   . TYMPANOSTOMY TUBE PLACEMENT      SOCIAL HISTORY: Social History   Tobacco  Use  . Smoking status: Former Smoker    Last attempt to quit: 05/05/1991    Years since quitting: 26.3  . Smokeless tobacco: Never Used  Substance Use Topics  . Alcohol use: Yes    Alcohol/week: 1.2 oz    Types: 2 Standard drinks or equivalent per week    Comment: occ  . Drug use: No    FAMILY HISTORY: Family History  Problem Relation Age of Onset  . Arthritis Other   . Cancer Other        cervical  . Depression Other   . Diabetes Other   . Ulcers Other   . Breast cancer Maternal Grandmother   . Diabetes Mother   . Heart disease Mother   . Cancer Mother   . Depression Mother   . Alcoholism Mother   . Eating disorder Mother   . Diabetes Father   . Hypertension  Father   . Depression Father   . Alcoholism Father     ROS: Review of Systems  Constitutional: Positive for weight loss.  Respiratory: Negative for shortness of breath (on exertion).   Cardiovascular: Negative for chest pain.  Gastrointestinal: Negative for nausea and vomiting.  Musculoskeletal:       Negative muscle weakness    PHYSICAL EXAM: Blood pressure 132/76, pulse 90, temperature 97.6 F (36.4 C), temperature source Oral, height 5\' 7"  (1.702 m), weight 179 lb (81.2 kg), last menstrual period 08/08/2012, SpO2 96 %. Body mass index is 28.04 kg/m. Physical Exam  Constitutional: She is oriented to person, place, and time. She appears well-developed and well-nourished.  Cardiovascular: Normal rate.  Pulmonary/Chest: Effort normal.  Musculoskeletal: Normal range of motion.  Neurological: She is oriented to person, place, and time.  Skin: Skin is warm and dry.  Psychiatric: She has a normal mood and affect. Her behavior is normal.  Vitals reviewed.   RECENT LABS AND TESTS: BMET    Component Value Date/Time   NA 139 07/18/2017 0815   K 4.2 07/18/2017 0815   CL 99 07/18/2017 0815   CO2 20 07/18/2017 0815   GLUCOSE 126 (H) 07/18/2017 0815   GLUCOSE 127 (H) 04/23/2017 0925   BUN 19 07/18/2017 0815   CREATININE 0.74 07/18/2017 0815   CALCIUM 9.4 07/18/2017 0815   GFRNONAA 90 07/18/2017 0815   GFRAA 103 07/18/2017 0815   Lab Results  Component Value Date   HGBA1C 7.0 (H) 07/18/2017   HGBA1C 7.6 (H) 04/23/2017   HGBA1C 7.5 (H) 01/11/2017   HGBA1C 7.8 (H) 10/02/2016   HGBA1C 8.4 (H) 06/27/2016   Lab Results  Component Value Date   INSULIN 11.2 07/18/2017   CBC    Component Value Date/Time   WBC 5.5 07/18/2017 0815   WBC 6.2 04/23/2017 0925   RBC 4.60 07/18/2017 0815   RBC 4.78 04/23/2017 0925   HGB 13.1 07/18/2017 0815   HCT 40.1 07/18/2017 0815   PLT 250.0 04/23/2017 0925   MCV 87 07/18/2017 0815   MCH 28.5 07/18/2017 0815   MCH 28.2 06/27/2016 0906    MCHC 32.7 07/18/2017 0815   MCHC 32.8 04/23/2017 0925   RDW 13.4 07/18/2017 0815   LYMPHSABS 1.6 07/18/2017 0815   MONOABS 0.4 04/23/2017 0925   EOSABS 0.1 07/18/2017 0815   BASOSABS 0.0 07/18/2017 0815   Iron/TIBC/Ferritin/ %Sat No results found for: IRON, TIBC, FERRITIN, IRONPCTSAT Lipid Panel     Component Value Date/Time   CHOL 180 07/18/2017 0815   TRIG 224 (H) 07/18/2017 0815  HDL 47 07/18/2017 0815   CHOLHDL 4 04/23/2017 0925   VLDL 47.2 (H) 04/23/2017 0925   LDLCALC 88 07/18/2017 0815   LDLDIRECT 82.0 04/23/2017 0925   Hepatic Function Panel     Component Value Date/Time   PROT 7.2 07/18/2017 0815   ALBUMIN 4.6 07/18/2017 0815   AST 18 07/18/2017 0815   ALT 25 07/18/2017 0815   ALKPHOS 84 07/18/2017 0815   BILITOT 0.4 07/18/2017 0815   BILIDIR 0.1 04/23/2017 0925      Component Value Date/Time   TSH 2.130 07/18/2017 0815   TSH 1.15 04/23/2017 0925   TSH 1.77 03/21/2016 0732    ASSESSMENT AND PLAN: Vitamin D deficiency - Plan: Vitamin D, Ergocalciferol, (DRISDOL) 50000 units CAPS capsule  Essential hypertension  At risk for heart disease  Class 1 obesity with serious comorbidity and body mass index (BMI) of 30.0 to 30.9 in adult, unspecified obesity type - Starting BMI greater then 30  PLAN:  Vitamin D Deficiency Tracy Anderson was informed that low vitamin D levels contributes to fatigue and are associated with obesity, breast, and colon cancer. She agrees to continue to take prescription Vit D @50 ,000 IU every week #4 with no refills and will follow up for routine testing of vitamin D, at least 2-3 times per year. She was informed of the risk of over-replacement of vitamin D and agrees to not increase her dose unless he discusses this with [place] first. Tracy Anderson agrees to follow up with our clinic in 3 weeks.  Hypertension We discussed sodium restriction, working on healthy weight loss, and a regular exercise program as the means to achieve improved blood  pressure control. Tracy Anderson agreed with this plan and agreed to follow up as directed. We will continue to monitor her blood pressure as well as her progress with the above lifestyle modifications. She will continue her medications as prescribed and will watch for signs of hypotension as she continues her lifestyle modifications.  Cardiovascular risk counseling Tracy Anderson was given extended (15 minutes) coronary artery disease prevention counseling today. She is 56 y.o. female and has risk factors for heart disease including obesity and hypertension. We discussed intensive lifestyle modifications today with an emphasis on specific weight loss instructions and strategies. Pt was also informed of the importance of increasing exercise and decreasing saturated fats to help prevent heart disease.  Obesity Tracy Anderson is currently in the action stage of change. As such, her goal is to continue with weight loss efforts She has agreed to keep a food journal with 1200 calories and 990 grams of protein daily Linzee has been instructed to work up to a goal of 150 minutes of combined cardio and strengthening exercise per week for weight loss and overall health benefits. We discussed the following Behavioral Modification Strategies today: increasing lean protein intake and keep a strict food journal  Giannina has agreed to follow up with our clinic in 3 weeks. She was informed of the importance of frequent follow up visits to maximize her success with intensive lifestyle modifications for her multiple health conditions.  I, Doreene Nest, am acting as transcriptionist for Tracy Duverney, PA-C  I have reviewed the above documentation for accuracy and completeness, and I agree with the above. -Tracy Duverney, PA-C  I have reviewed the above note and agree with the plan. -Dennard Nip, MD  OBESITY BEHAVIORAL INTERVENTION VISIT  Today's visit was # 5 out of 22.  Starting weight: 184 lbs Starting date:  07/17/17 Today's weight : 179 lbs Today's  date: 09/13/2017 Total lbs lost to date: 5 (Patients must lose 7 lbs in the first 6 months to continue with counseling)   ASK: We discussed the diagnosis of obesity with Tracy Anderson today and Joelene Millin agreed to give [place] permission to discuss obesity behavioral modification therapy today.  ASSESS: Ami has the diagnosis of obesity and her BMI today is 28.03 Amiria is in the action stage of change   ADVISE: Justina was educated on the multiple health risks of obesity as well as the benefit of weight loss to improve her health. She was advised of the need for long term treatment and the importance of lifestyle modifications.  AGREE: Multiple dietary modification options and treatment options were discussed and  Amada agreed to keep a food journal with 1200 calories and 90 grams of protein daily We discussed the following Behavioral Modification Strategies today: increasing lean protein intake and keep a strict food journal

## 2017-09-26 ENCOUNTER — Telehealth (INDEPENDENT_AMBULATORY_CARE_PROVIDER_SITE_OTHER): Payer: Self-pay | Admitting: Physician Assistant

## 2017-09-26 ENCOUNTER — Other Ambulatory Visit (INDEPENDENT_AMBULATORY_CARE_PROVIDER_SITE_OTHER): Payer: Self-pay

## 2017-09-26 DIAGNOSIS — E559 Vitamin D deficiency, unspecified: Secondary | ICD-10-CM

## 2017-09-26 DIAGNOSIS — E119 Type 2 diabetes mellitus without complications: Secondary | ICD-10-CM

## 2017-09-26 MED ORDER — LIRAGLUTIDE 18 MG/3ML ~~LOC~~ SOPN: 0.6000 mg | PEN_INJECTOR | SUBCUTANEOUS | 0 refills | Status: DC

## 2017-09-26 MED ORDER — VITAMIN D (ERGOCALCIFEROL) 1.25 MG (50000 UNIT) PO CAPS: 50000.0000 [IU] | ORAL_CAPSULE | ORAL | 1 refills | Status: DC

## 2017-09-26 MED FILL — VIT D2 1.25 MG (50,000 UNIT: 1.25 MG | 28 days supply | Qty: 4 | Fill #0

## 2017-09-26 MED FILL — VICTOZA 2-PAK 18 MG/3 ML PE: 18 | 60 days supply | Qty: 6 | Fill #0

## 2017-09-26 NOTE — Telephone Encounter (Signed)
VICTOZA AND VITAMIN D REQUEST BY [facility]    AT [facility]

## 2017-09-26 NOTE — Telephone Encounter (Signed)
[facility], [place]

## 2017-10-04 ENCOUNTER — Ambulatory Visit (INDEPENDENT_AMBULATORY_CARE_PROVIDER_SITE_OTHER): Payer: 59 | Admitting: Physician Assistant

## 2017-10-04 VITALS — BP 127/77 | HR 78 | Temp 97.8°F | Ht 67.0 in | Wt 179.0 lb

## 2017-10-04 DIAGNOSIS — E669 Obesity, unspecified: Secondary | ICD-10-CM | POA: Diagnosis not present

## 2017-10-04 DIAGNOSIS — Z6830 Body mass index (BMI) 30.0-30.9, adult: Secondary | ICD-10-CM

## 2017-10-04 DIAGNOSIS — I10 Essential (primary) hypertension: Secondary | ICD-10-CM

## 2017-10-04 NOTE — Progress Notes (Signed)
Office: [phone]  /  Fax: [phone]   HPI:   Chief Complaint: OBESITY [name] is here to discuss Tracy Anderson progress with Tracy Anderson obesity treatment plan. Tracy Anderson is on the keep a food journal with 1200 calories and 90 grams of protein daily and is following Tracy Anderson eating plan approximately 25 % of the time. Tracy Anderson states Tracy Anderson is exercising 0 minutes 0 times per week. Tracy Anderson maintained Tracy Anderson weight and Tracy Anderson states Tracy Anderson hunger is well controlled. Tracy Anderson would like more meal planning ideas.  Tracy Anderson weight is 179 lb (81.2 kg) today and has not lost weight since Tracy Anderson last visit. Tracy Anderson has lost 5 lbs since starting treatment with [place].  Hypertension Tracy Anderson is a 56 y.o. female with hypertension. Tracy Anderson blood pressure is stable and Tracy Anderson denies chest pain or shortness of breath. Tracy Anderson is working weight loss to help control Tracy Anderson blood pressure with the goal of decreasing Tracy Anderson risk of heart attack and stroke. Tracy Anderson's blood pressure is not currently controlled.  ALLERGIES: Allergies  Allergen Reactions  . Lisinopril Cough  . Shrimp [Shellfish Allergy] Diarrhea    Severe stomach cramps and diarrhea  . Clarithromycin Other (See Comments)    UNSPECIFIED     MEDICATIONS: Current Outpatient Medications on File Prior to Visit  Medication Sig Dispense Refill  . estradiol (ESTRACE VAGINAL) 0.1 MG/GM vaginal cream Place 1 Applicatorful vaginally at bedtime. (Patient taking differently: Place 1 Applicatorful vaginally at bedtime. 2 grams) 42.5 g 12  . FARXIGA 10 MG TABS tablet Take 10 mg by mouth daily. 90 tablet 3  . glucose blood (ACCU-CHEK GUIDE) test strip Use 2 X a day and diagnosis code is E 11.9 100 each 0  . Lancets (ACCU-CHEK SOFT TOUCH) lancets Use 1 X a day and diagnosis code is E 11.9 (Patient taking differently: Use 2 X a day and diagnosis code is E 11.9) 100 each 0  . losartan (COZAAR) 50 MG tablet Take 1 tablet (50 mg total) by mouth daily. 90 tablet 3  . metFORMIN (GLUCOPHAGE) 500 MG tablet Take 2  tablets (1,000 mg total) by mouth 2 (two) times daily with a meal. (Patient taking differently: Take 1,000 mg by mouth 2 (two) times daily with a meal. Take 500mg  Qam and 1000mg  Qpm) 360 tablet 3  . Vitamin D, Ergocalciferol, (DRISDOL) 50000 units CAPS capsule Take 1 capsule (50,000 Units total) by mouth every 7 (seven) days. 4 capsule 1  . liraglutide (VICTOZA) 18 MG/3ML SOPN Inject 0.1 mLs (0.6 mg total) into the skin 1 day or 1 dose for 1 dose. 1 pen 0   No current facility-administered medications on file prior to visit.     PAST MEDICAL HISTORY: Past Medical History:  Diagnosis Date  . Arthritis   . Asthma    as a child  . Back pain   . Depression   . Diabetes mellitus type II   . Herpes genitalia   . Hyperlipidemia   . Hypertension   . Shellfish allergy   . Shingles 2012   right flank    PAST SURGICAL HISTORY: Past Surgical History:  Procedure Laterality Date  . CHOLECYSTECTOMY N/A 07/05/2016   Procedure: LAPAROSCOPIC CHOLECYSTECTOMY;  Surgeon: Georganna Skeans, MD;  Location: [facility];  Service: General;  Laterality: N/A;  . colonscopy  05/20/09   per Dr. Deatra Ina, hyperplstic polyps, repeat in 10 yers   . TYMPANOSTOMY TUBE PLACEMENT      SOCIAL HISTORY: Social History   Tobacco Use  . Smoking status: Former  Smoker    Last attempt to quit: 05/05/1991    Years since quitting: 26.4  . Smokeless tobacco: Never Used  Substance Use Topics  . Alcohol use: Yes    Alcohol/week: 1.2 oz    Types: 2 Standard drinks or equivalent per week    Comment: occ  . Drug use: No    FAMILY HISTORY: Family History  Problem Relation Age of Onset  . Arthritis Other   . Cancer Other        cervical  . Depression Other   . Diabetes Other   . Ulcers Other   . Breast cancer Maternal Grandmother   . Diabetes Mother   . Heart disease Mother   . Cancer Mother   . Depression Mother   . Alcoholism Mother   . Eating disorder Mother   . Diabetes Father   . Hypertension Father   .  Depression Father   . Alcoholism Father     ROS: Review of Systems  Constitutional: Negative for weight loss.  Respiratory: Negative for shortness of breath.   Cardiovascular: Negative for chest pain.    PHYSICAL EXAM: Blood pressure 127/77, pulse 78, temperature 97.8 F (36.6 C), temperature source Oral, height 5\' 7"  (1.702 m), weight 179 lb (81.2 kg), last menstrual period 08/08/2012, SpO2 98 %. Body mass index is 28.04 kg/m. Physical Exam  Constitutional: Tracy Anderson is oriented to person, place, and time. Tracy Anderson appears well-developed and well-nourished.  Cardiovascular: Normal rate.  Pulmonary/Chest: Effort normal.  Musculoskeletal: Normal range of motion.  Neurological: Tracy Anderson is oriented to person, place, and time.  Skin: Skin is warm and dry.  Psychiatric: Tracy Anderson has a normal mood and affect. Tracy Anderson behavior is normal.  Vitals reviewed.   RECENT LABS AND TESTS: BMET    Component Value Date/Time   NA 139 07/18/2017 0815   K 4.2 07/18/2017 0815   CL 99 07/18/2017 0815   CO2 20 07/18/2017 0815   GLUCOSE 126 (H) 07/18/2017 0815   GLUCOSE 127 (H) 04/23/2017 0925   BUN 19 07/18/2017 0815   CREATININE 0.74 07/18/2017 0815   CALCIUM 9.4 07/18/2017 0815   GFRNONAA 90 07/18/2017 0815   GFRAA 103 07/18/2017 0815   Lab Results  Component Value Date   HGBA1C 7.0 (H) 07/18/2017   HGBA1C 7.6 (H) 04/23/2017   HGBA1C 7.5 (H) 01/11/2017   HGBA1C 7.8 (H) 10/02/2016   HGBA1C 8.4 (H) 06/27/2016   Lab Results  Component Value Date   INSULIN 11.2 07/18/2017   CBC    Component Value Date/Time   WBC 5.5 07/18/2017 0815   WBC 6.2 04/23/2017 0925   RBC 4.60 07/18/2017 0815   RBC 4.78 04/23/2017 0925   HGB 13.1 07/18/2017 0815   HCT 40.1 07/18/2017 0815   PLT 250.0 04/23/2017 0925   MCV 87 07/18/2017 0815   MCH 28.5 07/18/2017 0815   MCH 28.2 06/27/2016 0906   MCHC 32.7 07/18/2017 0815   MCHC 32.8 04/23/2017 0925   RDW 13.4 07/18/2017 0815   LYMPHSABS 1.6 07/18/2017 0815   MONOABS 0.4  04/23/2017 0925   EOSABS 0.1 07/18/2017 0815   BASOSABS 0.0 07/18/2017 0815   Iron/TIBC/Ferritin/ %Sat No results found for: IRON, TIBC, FERRITIN, IRONPCTSAT Lipid Panel     Component Value Date/Time   CHOL 180 07/18/2017 0815   TRIG 224 (H) 07/18/2017 0815   HDL 47 07/18/2017 0815   CHOLHDL 4 04/23/2017 0925   VLDL 47.2 (H) 04/23/2017 0925   LDLCALC 88 07/18/2017 0815  LDLDIRECT 82.0 04/23/2017 0925   Hepatic Function Panel     Component Value Date/Time   PROT 7.2 07/18/2017 0815   ALBUMIN 4.6 07/18/2017 0815   AST 18 07/18/2017 0815   ALT 25 07/18/2017 0815   ALKPHOS 84 07/18/2017 0815   BILITOT 0.4 07/18/2017 0815   BILIDIR 0.1 04/23/2017 0925      Component Value Date/Time   TSH 2.130 07/18/2017 0815   TSH 1.15 04/23/2017 0925   TSH 1.77 03/21/2016 0732    ASSESSMENT AND PLAN: Essential hypertension  Class 1 obesity with serious comorbidity and body mass index (BMI) of 30.0 to 30.9 in adult, unspecified obesity type - starting BMI >30  PLAN:  Hypertension We discussed sodium restriction, working on healthy weight loss, and a regular exercise program as the means to achieve improved blood pressure control. Tracy Anderson agreed with this plan and agreed to follow up as directed. We will continue to monitor Tracy Anderson blood pressure as well as Tracy Anderson progress with the above lifestyle modifications. Tracy Anderson will continue Tracy Anderson medications as prescribed and will watch for signs of hypotension as Tracy Anderson continues Tracy Anderson lifestyle modifications. Tracy Anderson agrees to follow up with our clinic in 2 to 3 weeks.  We spent > than 50% of the 15 minute visit on the counseling as documented in the note.  Obesity Tracy Anderson is currently in the action stage of change. As such, Tracy Anderson goal is to continue with weight loss efforts Tracy Anderson has agreed to keep a food journal with 1200 calories and 90 grams of protein daily Tracy Anderson has been instructed to work up to a goal of 150 minutes of combined cardio and  strengthening exercise per week for weight loss and overall health benefits. We discussed the following Behavioral Modification Strategies today: increasing lean protein intake and work on meal planning and easy cooking plans   Tracy Anderson has agreed to follow up with our clinic in 2 to 3 weeks. Tracy Anderson was informed of the importance of frequent follow up visits to maximize Tracy Anderson success with intensive lifestyle modifications for Tracy Anderson multiple health conditions.  I, Trixie Dredge, am acting as transcriptionist for Lacy Duverney, PA-C  I have reviewed the above documentation for accuracy and completeness, and I agree with the above. -Lacy Duverney, PA-C  I have reviewed the above note and agree with the plan. -Dennard Nip, MD     Today's visit was # 6 out of 22.  Starting weight: 184 lbs Starting date: 07/17/17 Today's weight : 179 lbs  Today's date: 10/04/2017 Total lbs lost to date: 5 (Patients must lose 7 lbs in the first 6 months to continue with counseling)   ASK: We discussed the diagnosis of obesity with Tracy Anderson today and Tracy Anderson agreed to give [place] permission to discuss obesity behavioral modification therapy today.  ASSESS: Tracy Anderson has the diagnosis of obesity and Tracy Anderson BMI today is 28.03 Tracy Anderson is in the action stage of change   ADVISE: Tracy Anderson was educated on the multiple health risks of obesity as well as the benefit of weight loss to improve Tracy Anderson health. Tracy Anderson was advised of the need for long term treatment and the importance of lifestyle modifications.  AGREE: Multiple dietary modification options and treatment options were discussed and  Tracy Anderson agreed to keep a food journal with 1200 calories and 90 grams of protein daily We discussed the following Behavioral Modification Strategies today: increasing lean protein intake and work on meal planning and easy cooking plans

## 2017-10-23 ENCOUNTER — Ambulatory Visit (INDEPENDENT_AMBULATORY_CARE_PROVIDER_SITE_OTHER): Payer: 59 | Admitting: Family Medicine

## 2017-10-23 VITALS — BP 135/71 | HR 70 | Temp 97.6°F | Ht 67.0 in | Wt 183.0 lb

## 2017-10-23 DIAGNOSIS — Z9189 Other specified personal risk factors, not elsewhere classified: Secondary | ICD-10-CM

## 2017-10-23 DIAGNOSIS — E559 Vitamin D deficiency, unspecified: Secondary | ICD-10-CM | POA: Diagnosis not present

## 2017-10-23 DIAGNOSIS — Z6830 Body mass index (BMI) 30.0-30.9, adult: Secondary | ICD-10-CM

## 2017-10-23 DIAGNOSIS — E119 Type 2 diabetes mellitus without complications: Secondary | ICD-10-CM

## 2017-10-23 DIAGNOSIS — E669 Obesity, unspecified: Secondary | ICD-10-CM | POA: Diagnosis not present

## 2017-10-23 MED ORDER — VITAMIN D (ERGOCALCIFEROL) 1.25 MG (50000 UNIT) PO CAPS: 50000.0000 [IU] | ORAL_CAPSULE | ORAL | 0 refills | Status: DC

## 2017-10-23 MED ORDER — FARXIGA 10 MG PO TABS: 10.0000 mg | ORAL_TABLET | Freq: Every day | ORAL | 0 refills | Status: DC

## 2017-10-23 MED ORDER — ACCU-CHEK SOFT TOUCH LANCETS MISC: 0 refills | Status: DC

## 2017-10-23 MED ORDER — GLUCOSE BLOOD VI STRP: ORAL_STRIP | 0 refills | Status: DC

## 2017-10-23 MED FILL — ACCU-CHEK GUIDE TEST STRIP: 50 days supply | Qty: 100 | Fill #0

## 2017-10-23 MED FILL — VIT D2 1.25 MG (50,000 UNIT: 1.25 MG | 28 days supply | Qty: 4 | Fill #0

## 2017-10-23 MED FILL — ACCU-CHEK FASTCLIX LANCETS: 51 days supply | Qty: 102 | Fill #0

## 2017-10-23 MED FILL — FARXIGA 10 MG TABLET: 10 | 30 days supply | Qty: 30 | Fill #0

## 2017-10-23 NOTE — Progress Notes (Signed)
Office: [phone]  /  Fax: [phone]   HPI:   Chief Complaint: OBESITY Tracy Anderson is here to discuss her progress with her obesity treatment plan. She is on the keep a food journal with 1200 calories and 90 grams of protein daily and is following her eating plan approximately 50 % of the time. She states she is exercising 0 minutes 0 times per week. Tracy Anderson is off track over the holidays. She is struggling to journal or meet her protein goals. Tracy Anderson doesn't want to have to journal, but she is not able to follow a structured plan over the holidays. Her weight is 183 lb (83 kg) today and has had a weight gain of 4 pounds over a period of 2 weeks since her last visit. She has lost 1 lb since starting treatment with [place].  Diabetes II Tracy Anderson has a diagnosis of diabetes type II. Tracy Anderson states fasting BGs is 120, on Victoza 0.6, metformin and farxiga and she denies any hypoglycemic episodes, nausea or vomiting. She didn't bring her blood sugar log today. She has been working on intensive lifestyle modifications including diet, exercise, and weight loss to help control her blood glucose levels.  At risk for cardiovascular disease Tracy Anderson is at a higher than average risk for cardiovascular disease due to obesity and diabetes. She currently denies any chest pain.  Vitamin D deficiency Tracy Anderson has a diagnosis of vitamin D deficiency. She is stable, not yet at goal.but is on vitamin D prescription and is due for labs. Tracy Anderson denies nausea, vomiting or muscle weakness.  ALLERGIES: Allergies  Allergen Reactions  . Lisinopril Cough  . Shrimp [Shellfish Allergy] Diarrhea    Severe stomach cramps and diarrhea  . Clarithromycin Other (See Comments)    UNSPECIFIED     MEDICATIONS: Current Outpatient Medications on File Prior to Visit  Medication Sig Dispense Refill  . estradiol (ESTRACE VAGINAL) 0.1 MG/GM vaginal cream Place 1 Applicatorful vaginally at bedtime. (Patient taking  differently: Place 1 Applicatorful vaginally at bedtime. 2 grams) 42.5 g 12  . FARXIGA 10 MG TABS tablet Take 10 mg by mouth daily. 90 tablet 3  . glucose blood (ACCU-CHEK GUIDE) test strip Use 2 X a day and diagnosis code is E 11.9 100 each 0  . Lancets (ACCU-CHEK SOFT TOUCH) lancets Use 1 X a day and diagnosis code is E 11.9 (Patient taking differently: Use 2 X a day and diagnosis code is E 11.9) 100 each 0  . losartan (COZAAR) 50 MG tablet Take 1 tablet (50 mg total) by mouth daily. 90 tablet 3  . metFORMIN (GLUCOPHAGE) 500 MG tablet Take 2 tablets (1,000 mg total) by mouth 2 (two) times daily with a meal. (Patient taking differently: Take 1,000 mg by mouth 2 (two) times daily with a meal. Take 500mg  Qam and 1000mg  Qpm) 360 tablet 3  . Vitamin D, Ergocalciferol, (DRISDOL) 50000 units CAPS capsule Take 1 capsule (50,000 Units total) by mouth every 7 (seven) days. 4 capsule 1  . liraglutide (VICTOZA) 18 MG/3ML SOPN Inject 0.1 mLs (0.6 mg total) into the skin 1 day or 1 dose for 1 dose. 1 pen 0   No current facility-administered medications on file prior to visit.     PAST MEDICAL HISTORY: Past Medical History:  Diagnosis Date  . Arthritis   . Asthma    as a child  . Back pain   . Depression   . Diabetes mellitus type II   . Herpes genitalia   . Hyperlipidemia   .  Hypertension   . Shellfish allergy   . Shingles 2012   right flank    PAST SURGICAL HISTORY: Past Surgical History:  Procedure Laterality Date  . CHOLECYSTECTOMY N/A 07/05/2016   Procedure: LAPAROSCOPIC CHOLECYSTECTOMY;  Surgeon: Georganna Skeans, MD;  Location: [facility];  Service: General;  Laterality: N/A;  . colonscopy  05/20/09   per Dr. Deatra Ina, hyperplstic polyps, repeat in 10 yers   . TYMPANOSTOMY TUBE PLACEMENT      SOCIAL HISTORY: Social History   Tobacco Use  . Smoking status: Former Smoker    Last attempt to quit: 05/05/1991    Years since quitting: 26.4  . Smokeless tobacco: Never Used  Substance Use Topics   . Alcohol use: Yes    Alcohol/week: 1.2 oz    Types: 2 Standard drinks or equivalent per week    Comment: occ  . Drug use: No    FAMILY HISTORY: Family History  Problem Relation Age of Onset  . Arthritis Other   . Cancer Other        cervical  . Depression Other   . Diabetes Other   . Ulcers Other   . Breast cancer Maternal Grandmother   . Diabetes Mother   . Heart disease Mother   . Cancer Mother   . Depression Mother   . Alcoholism Mother   . Eating disorder Mother   . Diabetes Father   . Hypertension Father   . Depression Father   . Alcoholism Father     ROS: Review of Systems  Constitutional: Negative for weight loss.  Cardiovascular: Negative for chest pain.  Gastrointestinal: Negative for nausea and vomiting.  Musculoskeletal:       Negative muscle weakness  Endo/Heme/Allergies:       Negative hypoglycemia    PHYSICAL EXAM: Blood pressure 135/71, pulse 70, temperature 97.6 F (36.4 C), temperature source Oral, height 5\' 7"  (1.702 m), weight 183 lb (83 kg), last menstrual period 08/08/2012, SpO2 100 %. Body mass index is 28.66 kg/m. Physical Exam  Constitutional: She is oriented to person, place, and time. She appears well-developed and well-nourished.  Cardiovascular: Normal rate.  Pulmonary/Chest: Effort normal.  Musculoskeletal: Normal range of motion.  Neurological: She is oriented to person, place, and time.  Skin: Skin is warm and dry.  Psychiatric: She has a normal mood and affect. Her behavior is normal.  Vitals reviewed.   RECENT LABS AND TESTS: BMET    Component Value Date/Time   NA 139 07/18/2017 0815   K 4.2 07/18/2017 0815   CL 99 07/18/2017 0815   CO2 20 07/18/2017 0815   GLUCOSE 126 (H) 07/18/2017 0815   GLUCOSE 127 (H) 04/23/2017 0925   BUN 19 07/18/2017 0815   CREATININE 0.74 07/18/2017 0815   CALCIUM 9.4 07/18/2017 0815   GFRNONAA 90 07/18/2017 0815   GFRAA 103 07/18/2017 0815   Lab Results  Component Value Date    HGBA1C 7.0 (H) 07/18/2017   HGBA1C 7.6 (H) 04/23/2017   HGBA1C 7.5 (H) 01/11/2017   HGBA1C 7.8 (H) 10/02/2016   HGBA1C 8.4 (H) 06/27/2016   Lab Results  Component Value Date   INSULIN 11.2 07/18/2017   CBC    Component Value Date/Time   WBC 5.5 07/18/2017 0815   WBC 6.2 04/23/2017 0925   RBC 4.60 07/18/2017 0815   RBC 4.78 04/23/2017 0925   HGB 13.1 07/18/2017 0815   HCT 40.1 07/18/2017 0815   PLT 250.0 04/23/2017 0925   MCV 87 07/18/2017 0815  MCH 28.5 07/18/2017 0815   MCH 28.2 06/27/2016 0906   MCHC 32.7 07/18/2017 0815   MCHC 32.8 04/23/2017 0925   RDW 13.4 07/18/2017 0815   LYMPHSABS 1.6 07/18/2017 0815   MONOABS 0.4 04/23/2017 0925   EOSABS 0.1 07/18/2017 0815   BASOSABS 0.0 07/18/2017 0815   Iron/TIBC/Ferritin/ %Sat No results found for: IRON, TIBC, FERRITIN, IRONPCTSAT Lipid Panel     Component Value Date/Time   CHOL 180 07/18/2017 0815   TRIG 224 (H) 07/18/2017 0815   HDL 47 07/18/2017 0815   CHOLHDL 4 04/23/2017 0925   VLDL 47.2 (H) 04/23/2017 0925   LDLCALC 88 07/18/2017 0815   LDLDIRECT 82.0 04/23/2017 0925   Hepatic Function Panel     Component Value Date/Time   PROT 7.2 07/18/2017 0815   ALBUMIN 4.6 07/18/2017 0815   AST 18 07/18/2017 0815   ALT 25 07/18/2017 0815   ALKPHOS 84 07/18/2017 0815   BILITOT 0.4 07/18/2017 0815   BILIDIR 0.1 04/23/2017 0925      Component Value Date/Time   TSH 2.130 07/18/2017 0815   TSH 1.15 04/23/2017 0925   TSH 1.77 03/21/2016 0732    ASSESSMENT AND PLAN: Type 2 diabetes mellitus without complication, without long-term current use of insulin (Tracy Anderson) - Plan: Comprehensive metabolic panel, Hemoglobin A1c, Insulin, random, Lipid Panel With LDL/HDL Ratio, FARXIGA 10 MG TABS tablet, glucose blood (ACCU-CHEK GUIDE) test strip, Lancets (ACCU-CHEK SOFT TOUCH) lancets  Vitamin D deficiency - Plan: VITAMIN D 25 Hydroxy (Vit-D Deficiency, Fractures), Vitamin D, Ergocalciferol, (DRISDOL) 50000 units CAPS capsule  At  risk for heart disease  Class 1 obesity with serious comorbidity and body mass index (BMI) of 30.0 to 30.9 in adult, unspecified obesity type - Starting BMI greater then 30  PLAN:  Diabetes II Tracy Anderson has been given extensive diabetes education by myself today including ideal fasting and post-prandial blood glucose readings, individual ideal Hgb A1c goals  and hypoglycemia prevention. We discussed the importance of good blood sugar control to decrease the likelihood of diabetic complications such as nephropathy, neuropathy, limb loss, blindness, coronary artery disease, and death. We discussed the importance of intensive lifestyle modification including diet, exercise and weight loss as the first line treatment for diabetes. Tracy Anderson agrees to continue farxiga 10 mg qd #90 with no refills and we will refill lancets, strips and pen needles. Tracy Anderson agrees to follow up at the agreed upon time.  Cardiovascular risk counseling Tracy Anderson was given extended (15 minutes) coronary artery disease prevention counseling today. She is 56 y.o. female and has risk factors for heart disease including obesity and diabetes. We discussed intensive lifestyle modifications today with an emphasis on specific weight loss instructions and strategies. Pt was also informed of the importance of increasing exercise and decreasing saturated fats to help prevent heart disease.  Vitamin D Deficiency Tracy Anderson was informed that low vitamin D levels contributes to fatigue and are associated with obesity, breast, and colon cancer. She agrees to continue to take prescription Vit D @50 ,000 IU every week and will follow up for routine testing of vitamin D, at least 2-3 times per year. She was informed of the risk of over-replacement of vitamin D and agrees to not increase her dose unless he discusses this with [place] first.  Obesity Tracy Anderson is currently in the action stage of change. As such, her goal is to continue with weight loss  efforts She has agreed to portion control better and make smarter food choices, such as increase vegetables and decrease simple  carbohydrates until the 1st of the year. Tracy Anderson has been instructed to work up to a goal of 150 minutes of combined cardio and strengthening exercise per week for weight loss and overall health benefits. We discussed the following Behavioral Modification Strategies today: increasing lean protein intake, work on meal planning and easy cooking plans, dealing with family or coworker sabotage and holiday eating strategies   Tracy Anderson has agreed to follow up with our clinic in 2 to 3 weeks. She was informed of the importance of frequent follow up visits to maximize her success with intensive lifestyle modifications for her multiple health conditions.  I, Tracy Anderson, am acting as transcriptionist for Dennard Nip, MD  I have reviewed the above documentation for accuracy and completeness, and I agree with the above. -Dennard Nip, MD   OBESITY BEHAVIORAL INTERVENTION VISIT  Today's visit was # 7 out of 22.  Starting weight: 184 lbs Starting date: 07/17/17 Today's weight : 183 lbs Today's date: 10/23/2017 Total lbs lost to date: 1 (Patients must lose 7 lbs in the first 6 months to continue with counseling)   ASK: We discussed the diagnosis of obesity with Yolonda Kida today and Joelene Millin agreed to give [place] permission to discuss obesity behavioral modification therapy today.  ASSESS: Aleja has the diagnosis of obesity and her BMI today is 28.66 Shi is in the action stage of change   ADVISE: Sumaiya was educated on the multiple health risks of obesity as well as the benefit of weight loss to improve her health. She was advised of the need for long term treatment and the importance of lifestyle modifications.  AGREE: Multiple dietary modification options and treatment options were discussed and  Ayannah agreed to portion control better and make  smarter food choices, such as increase vegetables and decrease simple carbohydrates until the 1st of the year We discussed the following Behavioral Modification Strategies today: increasing lean protein intake, work on meal planning and easy cooking plans, dealing with family or coworker sabotage and holiday eating strategies

## 2017-10-24 LAB — COMPREHENSIVE METABOLIC PANEL
A/G RATIO: 1.8 (ref 1.2–2.2)
ALBUMIN: 4.6 g/dL (ref 3.5–5.5)
ALT: 20 IU/L (ref 0–32)
AST: 13 IU/L (ref 0–40)
Alkaline Phosphatase: 74 IU/L (ref 39–117)
BILIRUBIN TOTAL: 0.5 mg/dL (ref 0.0–1.2)
BUN / CREAT RATIO: 18 (ref 9–23)
BUN: 14 mg/dL (ref 6–24)
CALCIUM: 9.6 mg/dL (ref 8.7–10.2)
CHLORIDE: 102 mmol/L (ref 96–106)
CO2: 25 mmol/L (ref 20–29)
Creatinine, Ser: 0.77 mg/dL (ref 0.57–1.00)
GFR, EST AFRICAN AMERICAN: 98 mL/min/{1.73_m2} (ref >59)
GFR, EST NON AFRICAN AMERICAN: 85 mL/min/{1.73_m2} (ref >59)
GLOBULIN, TOTAL: 2.5 g/dL (ref 1.5–4.5)
Glucose: 109 mg/dL — ABNORMAL HIGH (ref 65–99)
POTASSIUM: 4.8 mmol/L (ref 3.5–5.2)
Sodium: 143 mmol/L (ref 134–144)
TOTAL PROTEIN: 7.1 g/dL (ref 6.0–8.5)

## 2017-10-24 LAB — INSULIN, RANDOM: INSULIN: 11.5 u[IU]/mL (ref 2.6–24.9)

## 2017-10-24 LAB — LIPID PANEL WITH LDL/HDL RATIO
Cholesterol, Total: 155 mg/dL (ref 100–199)
HDL: 53 mg/dL (ref >39)
LDL CALC: 73 mg/dL (ref 0–99)
LDL/HDL RATIO: 1.4 ratio (ref 0.0–3.2)
TRIGLYCERIDES: 144 mg/dL (ref 0–149)
VLDL Cholesterol Cal: 29 mg/dL (ref 5–40)

## 2017-10-24 LAB — HEMOGLOBIN A1C
ESTIMATED AVERAGE GLUCOSE: 154 mg/dL
HEMOGLOBIN A1C: 7 % — AB (ref 4.8–5.6)

## 2017-10-24 LAB — VITAMIN D 25 HYDROXY (VIT D DEFICIENCY, FRACTURES): VIT D 25 HYDROXY: 51.3 ng/mL (ref 30.0–100.0)

## 2017-11-13 ENCOUNTER — Ambulatory Visit (INDEPENDENT_AMBULATORY_CARE_PROVIDER_SITE_OTHER): Payer: 59 | Admitting: Family Medicine

## 2017-11-13 ENCOUNTER — Encounter (INDEPENDENT_AMBULATORY_CARE_PROVIDER_SITE_OTHER): Payer: Self-pay

## 2017-11-13 VITALS — BP 126/74 | HR 68 | Temp 98.2°F | Ht 67.0 in | Wt 183.0 lb

## 2017-11-13 DIAGNOSIS — E669 Obesity, unspecified: Secondary | ICD-10-CM

## 2017-11-13 DIAGNOSIS — F3289 Other specified depressive episodes: Secondary | ICD-10-CM | POA: Diagnosis not present

## 2017-11-13 DIAGNOSIS — I10 Essential (primary) hypertension: Secondary | ICD-10-CM | POA: Diagnosis not present

## 2017-11-13 DIAGNOSIS — Z6830 Body mass index (BMI) 30.0-30.9, adult: Secondary | ICD-10-CM | POA: Diagnosis not present

## 2017-11-13 DIAGNOSIS — E559 Vitamin D deficiency, unspecified: Secondary | ICD-10-CM

## 2017-11-13 DIAGNOSIS — E119 Type 2 diabetes mellitus without complications: Secondary | ICD-10-CM | POA: Diagnosis not present

## 2017-11-13 MED ORDER — BUPROPION HCL ER (SR) 150 MG PO TB12: 150.0000 mg | ORAL_TABLET | Freq: Every day | ORAL | 0 refills | Status: DC

## 2017-11-13 MED ORDER — VITAMIN D (ERGOCALCIFEROL) 1.25 MG (50000 UNIT) PO CAPS: 50000.0000 [IU] | ORAL_CAPSULE | ORAL | 0 refills | Status: DC

## 2017-11-13 MED ORDER — LOSARTAN POTASSIUM 50 MG PO TABS: 50.0000 mg | ORAL_TABLET | Freq: Every day | ORAL | 0 refills | Status: DC

## 2017-11-13 MED ORDER — LIRAGLUTIDE 18 MG/3ML ~~LOC~~ SOPN: 1.2000 mg | PEN_INJECTOR | Freq: Every morning | SUBCUTANEOUS | 0 refills | Status: DC

## 2017-11-13 MED ORDER — INSULIN PEN NEEDLE 32G X 4 MM MISC: 1.0000 | Freq: Every day | 0 refills | Status: DC

## 2017-11-13 NOTE — Progress Notes (Signed)
Office: [phone]  /  Fax: [phone]   HPI:   Chief Complaint: OBESITY Tracy Anderson is here to discuss her progress with her obesity treatment plan. She is on the portion control better and make smarter food choices, such as increase vegetables and decrease simple carbohydrates  and is following her eating plan approximately 70 % of the time. She states she is exercising 0 minutes 0 times per week. Tracy Anderson did well maintaining weight over the holidays. She has been struggling more, but is ready to get back on track. Her weight is 183 lb (83 kg) today and has maintained weight over a period of 3 weeks since her last visit. She has lost 1 lb since starting treatment with [place].  Diabetes II Tracy Anderson has a diagnosis of diabetes type II and is on Victoza 0.6, but still notes polyphagia. Tracy Anderson denies any hypoglycemic episodes. She has been working on intensive lifestyle modifications including diet, exercise, and weight loss to help control her blood glucose levels.  Vitamin D deficiency Tracy Anderson has a diagnosis of vitamin D deficiency. She is currently taking vit D and is now at goal. Tracy Anderson denies nausea, vomiting or muscle weakness.   Ref. Range 10/23/2017 08:26  Vitamin D, 25-Hydroxy Latest Ref Range: 30.0 - 100.0 ng/mL 51.3   Hypertension Tracy Anderson is a 57 y.o. female with hypertension. Tracy Anderson denies chest pain or headache. She is working weight loss to help control her blood pressure with the goal of decreasing her risk of heart attack and stroke. Tracy Anderson blood pressure is well controlled with medication and diet.  Depression with emotional eating behaviors Tracy Anderson notes increased nighttime stress and emotional eating. She feels somewhat out of control and is frustrated because she knows better. Tracy Anderson struggles with is emotional eating and using food for comfort to the extent that it is negatively impacting her health. She often snacks when she is not  hungry. Tracy Anderson sometimes feels she is out of control and then feels guilty that she made poor food choices. She has been working on behavior modification techniques to help reduce her emotional eating and has been somewhat successful. She shows no sign of suicidal or homicidal ideations.  Depression screen PHQ 2/9 07/17/2017  Decreased Interest 1  Down, Depressed, Hopeless 1  PHQ - 2 Score 2  Altered sleeping 2  Tired, decreased energy 1  Change in appetite 1  Feeling bad or failure about yourself  2  Trouble concentrating 0  Moving slowly or fidgety/restless 0  Suicidal thoughts 0  PHQ-9 Score 8  Difficult doing work/chores Not difficult at all     ALLERGIES: Allergies  Allergen Reactions  . Lisinopril Cough  . Shrimp [Shellfish Allergy] Diarrhea    Severe stomach cramps and diarrhea  . Clarithromycin Other (See Comments)    UNSPECIFIED     MEDICATIONS: Current Outpatient Medications on File Prior to Visit  Medication Sig Dispense Refill  . estradiol (ESTRACE VAGINAL) 0.1 MG/GM vaginal cream Place 1 Applicatorful vaginally at bedtime. (Patient taking differently: Place 1 Applicatorful vaginally at bedtime. 2 grams) 42.5 g 12  . FARXIGA 10 MG TABS tablet Take 10 mg by mouth daily. 30 tablet 0  . glucose blood (ACCU-CHEK GUIDE) test strip Use 2 X a day and diagnosis code is E 11.9 100 each 0  . Lancets (ACCU-CHEK SOFT TOUCH) lancets Use 2 X a day and diagnosis code is E 11.9 100 each 0  . losartan (COZAAR) 50 MG tablet Take 1 tablet (50  mg total) by mouth daily. 90 tablet 3  . metFORMIN (GLUCOPHAGE) 500 MG tablet Take 2 tablets (1,000 mg total) by mouth 2 (two) times daily with a meal. (Patient taking differently: Take 1,000 mg by mouth 2 (two) times daily with a meal. Take 500mg  Qam and 1000mg  Qpm) 360 tablet 3  . Vitamin D, Ergocalciferol, (DRISDOL) 50000 units CAPS capsule Take 1 capsule (50,000 Units total) by mouth every 7 (seven) days. 4 capsule 0   No current  facility-administered medications on file prior to visit.     PAST MEDICAL HISTORY: Past Medical History:  Diagnosis Date  . Arthritis   . Asthma    as a child  . Back pain   . Depression   . Diabetes mellitus type II   . Herpes genitalia   . Hyperlipidemia   . Hypertension   . Shellfish allergy   . Shingles 2012   right flank    PAST SURGICAL HISTORY: Past Surgical History:  Procedure Laterality Date  . CHOLECYSTECTOMY N/A 07/05/2016   Procedure: LAPAROSCOPIC CHOLECYSTECTOMY;  Surgeon: Georganna Skeans, MD;  Location: [place];  Service: General;  Laterality: N/A;  . colonscopy  05/20/09   per Dr. Deatra Ina, hyperplstic polyps, repeat in 10 yers   . TYMPANOSTOMY TUBE PLACEMENT      SOCIAL HISTORY: Social History   Tobacco Use  . Smoking status: Former Smoker    Last attempt to quit: 05/05/1991    Years since quitting: 26.5  . Smokeless tobacco: Never Used  Substance Use Topics  . Alcohol use: Yes    Alcohol/week: 1.2 oz    Types: 2 Standard drinks or equivalent per week    Comment: occ  . Drug use: No    FAMILY HISTORY: Family History  Problem Relation Age of Onset  . Arthritis Other   . Cancer Other        cervical  . Depression Other   . Diabetes Other   . Ulcers Other   . Breast cancer Maternal Grandmother   . Diabetes Mother   . Heart disease Mother   . Cancer Mother   . Depression Mother   . Alcoholism Mother   . Eating disorder Mother   . Diabetes Father   . Hypertension Father   . Depression Father   . Alcoholism Father     ROS: Review of Systems  Constitutional: Negative for weight loss.  Cardiovascular: Negative for chest pain.  Gastrointestinal: Negative for nausea and vomiting.  Musculoskeletal:       Negative muscle weakness  Neurological: Negative for headaches.  Endo/Heme/Allergies:       Positive polyphagia Negative hypoglycemia  Psychiatric/Behavioral: Positive for depression. Negative for suicidal ideas.    PHYSICAL EXAM: Blood  pressure 126/74, pulse 68, temperature 98.2 F (36.8 C), temperature source Oral, height 5\' 7"  (1.702 m), weight 183 lb (83 kg), last menstrual period 08/08/2012, SpO2 97 %. Body mass index is 28.66 kg/m. Physical Exam  Constitutional: She is oriented to person, place, and time. She appears well-developed and well-nourished.  Cardiovascular: Normal rate.  Pulmonary/Chest: Effort normal.  Musculoskeletal: Normal range of motion.  Neurological: She is oriented to person, place, and time.  Skin: Skin is warm and dry.  Psychiatric: She has a normal mood and affect. Her behavior is normal.  Vitals reviewed.   RECENT LABS AND TESTS: BMET    Component Value Date/Time   NA 143 10/23/2017 0826   K 4.8 10/23/2017 0826   CL 102 10/23/2017 0826  CO2 25 10/23/2017 0826   GLUCOSE 109 (H) 10/23/2017 0826   GLUCOSE 127 (H) 04/23/2017 0925   BUN 14 10/23/2017 0826   CREATININE 0.77 10/23/2017 0826   CALCIUM 9.6 10/23/2017 0826   GFRNONAA 85 10/23/2017 0826   GFRAA 98 10/23/2017 0826   Lab Results  Component Value Date   HGBA1C 7.0 (H) 10/23/2017   HGBA1C 7.0 (H) 07/18/2017   HGBA1C 7.6 (H) 04/23/2017   HGBA1C 7.5 (H) 01/11/2017   HGBA1C 7.8 (H) 10/02/2016   Lab Results  Component Value Date   INSULIN 11.5 10/23/2017   INSULIN 11.2 07/18/2017   CBC    Component Value Date/Time   WBC 5.5 07/18/2017 0815   WBC 6.2 04/23/2017 0925   RBC 4.60 07/18/2017 0815   RBC 4.78 04/23/2017 0925   HGB 13.1 07/18/2017 0815   HCT 40.1 07/18/2017 0815   PLT 250.0 04/23/2017 0925   MCV 87 07/18/2017 0815   MCH 28.5 07/18/2017 0815   MCH 28.2 06/27/2016 0906   MCHC 32.7 07/18/2017 0815   MCHC 32.8 04/23/2017 0925   RDW 13.4 07/18/2017 0815   LYMPHSABS 1.6 07/18/2017 0815   MONOABS 0.4 04/23/2017 0925   EOSABS 0.1 07/18/2017 0815   BASOSABS 0.0 07/18/2017 0815   Iron/TIBC/Ferritin/ %Sat No results found for: IRON, TIBC, FERRITIN, IRONPCTSAT Lipid Panel     Component Value Date/Time    CHOL 155 10/23/2017 0826   TRIG 144 10/23/2017 0826   HDL 53 10/23/2017 0826   CHOLHDL 4 04/23/2017 0925   VLDL 47.2 (H) 04/23/2017 0925   LDLCALC 73 10/23/2017 0826   LDLDIRECT 82.0 04/23/2017 0925   Hepatic Function Panel     Component Value Date/Time   PROT 7.1 10/23/2017 0826   ALBUMIN 4.6 10/23/2017 0826   AST 13 10/23/2017 0826   ALT 20 10/23/2017 0826   ALKPHOS 74 10/23/2017 0826   BILITOT 0.5 10/23/2017 0826   BILIDIR 0.1 04/23/2017 0925      Component Value Date/Time   TSH 2.130 07/18/2017 0815   TSH 1.15 04/23/2017 0925   TSH 1.77 03/21/2016 0732     Ref. Range 10/23/2017 08:26  Vitamin D, 25-Hydroxy Latest Ref Range: 30.0 - 100.0 ng/mL 51.3    ASSESSMENT AND PLAN: Type 2 diabetes mellitus without complication, without long-term current use of insulin (HCC) - Plan: liraglutide (VICTOZA) 18 MG/3ML SOPN, Insulin Pen Needle 32G X 4 MM MISC  Vitamin D deficiency - Plan: Vitamin D, Ergocalciferol, (DRISDOL) 50000 units CAPS capsule  Essential hypertension - Plan: losartan (COZAAR) 50 MG tablet  Other depression - with emotional eating - Plan: buPROPion (WELLBUTRIN SR) 150 MG 12 hr tablet  Class 1 obesity with serious comorbidity and body mass index (BMI) of 30.0 to 30.9 in adult, unspecified obesity type - Starting BMI greater then 30  PLAN:  Diabetes II Tracy Anderson has been given extensive diabetes education by myself today including ideal fasting and post-prandial blood glucose readings, individual ideal Hgb A1c goals  and hypoglycemia prevention. We discussed the importance of good blood sugar control to decrease the likelihood of diabetic complications such as nephropathy, neuropathy, limb loss, blindness, coronary artery disease, and death. We discussed the importance of intensive lifestyle modification including diet, exercise and weight loss as the first line treatment for diabetes. We will refill nano needles and Doloras agrees to increase Victoza to 1.2 mg #2  pen with no refills. Tranise agrees to follow up at the agreed upon time.  Vitamin D Deficiency Tracy Anderson was informed  that low vitamin D levels contributes to fatigue and are associated with obesity, breast, and colon cancer. She agrees to continue to take prescription Vit D @50 ,000 IU every week #4 with no refills and will follow up for routine testing of vitamin D, at least 2-3 times per year. She was informed of the risk of over-replacement of vitamin D and agrees to not increase her dose unless he discusses this with [place] first. Tracy Anderson agrees to follow up with our clinic in 2 weeks.  Hypertension We discussed sodium restriction, working on healthy weight loss, and a regular exercise program as the means to achieve improved blood pressure control. Tracy Anderson agreed with this plan and agreed to follow up as directed. We will continue to monitor her blood pressure as well as her progress with the above lifestyle modifications. She agrees to continue Losartan 50 mg qd #30 with no refills and will watch for signs of hypotension as she continues her lifestyle modifications.  Depression with Emotional Eating Behaviors We discussed behavior modification techniques today to help Tracy Anderson deal with her emotional eating and depression. She has agreed start Wellbutrin SR 150 mg qam #30 with no refills and follow up as directed.  Obesity Tracy Anderson is currently in the action stage of change. As such, her goal is to continue with weight loss efforts She has agreed to follow the Category 2 plan Tracy Anderson has been instructed to work up to a goal of 150 minutes of combined cardio and strengthening exercise per week for weight loss and overall health benefits. We discussed the following Behavioral Modification Strategies today: increasing lean protein intake and decreasing simple carbohydrates   Tracy Anderson has agreed to follow up with our clinic in 2 weeks. She was informed of the importance of frequent follow up  visits to maximize her success with intensive lifestyle modifications for her multiple health conditions.   OBESITY BEHAVIORAL INTERVENTION VISIT  Today's visit was # 8 out of 22.  Starting weight: 184 lbs Starting date: 07/17/17 Today's weight : 183 lbs  Today's date: 11/13/2017 Total lbs lost to date: 1 (Patients must lose 7 lbs in the first 6 months to continue with counseling)   ASK: We discussed the diagnosis of obesity with Tracy Anderson today and Joelene Millin agreed to give [place] permission to discuss obesity behavioral modification therapy today.  ASSESS: Tracy Anderson has the diagnosis of obesity and her BMI today is 28.66 Adaly is in the action stage of change   ADVISE: Kelby was educated on the multiple health risks of obesity as well as the benefit of weight loss to improve her health. She was advised of the need for long term treatment and the importance of lifestyle modifications.  AGREE: Multiple dietary modification options and treatment options were discussed and  Kariya agreed to the above obesity treatment plan.  I, Doreene Nest, am acting as transcriptionist for Dennard Nip, MD  I have reviewed the above documentation for accuracy and completeness, and I agree with the above. -Dennard Nip, MD

## 2017-11-14 MED FILL — VIT D2 1.25 MG (50,000 UNIT: 1.25 MG | 28 days supply | Qty: 4 | Fill #0

## 2017-11-14 MED FILL — UNIFINE PENTIPS 32GX5/32: 32G X 4 MM | 90 days supply | Qty: 100 | Fill #0

## 2017-11-14 MED FILL — LOSARTAN POTASSIUM 50 MG TA: 50 | 30 days supply | Qty: 30 | Fill #0

## 2017-11-14 MED FILL — UNIFINE PENTIPS 32GX5/32": 32G X 4 MM | 90 days supply | Qty: 100 | Fill #0

## 2017-11-14 MED FILL — VICTOZA 2-PAK 18 MG/3 ML PE: 18 | 30 days supply | Qty: 6 | Fill #0

## 2017-11-14 MED FILL — BUPROPION SR 150 MG TABLET: 150 | 30 days supply | Qty: 30 | Fill #0

## 2017-11-16 ENCOUNTER — Ambulatory Visit (INDEPENDENT_AMBULATORY_CARE_PROVIDER_SITE_OTHER): Payer: 59

## 2017-11-16 ENCOUNTER — Ambulatory Visit: Payer: 59

## 2017-11-16 ENCOUNTER — Ambulatory Visit: Payer: 59 | Admitting: Podiatry

## 2017-11-16 DIAGNOSIS — M205X2 Other deformities of toe(s) (acquired), left foot: Secondary | ICD-10-CM

## 2017-11-16 DIAGNOSIS — M7750 Other enthesopathy of unspecified foot: Secondary | ICD-10-CM

## 2017-11-16 DIAGNOSIS — M778 Other enthesopathies, not elsewhere classified: Secondary | ICD-10-CM

## 2017-11-16 DIAGNOSIS — M779 Enthesopathy, unspecified: Secondary | ICD-10-CM

## 2017-11-16 MED FILL — metFORMIN HCL 500 MG TABS: 500 | 90 days supply | Qty: 360 | Fill #2

## 2017-11-19 MED ORDER — METFORMIN HCL 500 MG PO TABS: ORAL_TABLET | ORAL | 0 refills | Status: DC

## 2017-11-19 NOTE — Progress Notes (Signed)
Subjective:   Patient ID: Tracy Anderson, female   DOB: 57 y.o.   MRN: [id number]   HPI Tracy Anderson presents to the office today for concerns of pain to the left big toe joint.  She states that it hurts when she tries to bend her toe.  This is been ongoing for quite some time but denies any recent injury or trauma.  She said no recent treatment for this either.  Patient is some swelling but denies any redness or warmth.  Area is painful with pressure in shoes.  She has some minimal symptoms of the right side but not as significant as the left.  She states that she is not quite ready for surgery.  She has no other concerns today.   Review of Systems  All other systems reviewed and are negative.  Past Medical History:  Diagnosis Date  . Arthritis   . Asthma    as a child  . Back pain   . Depression   . Diabetes mellitus type II   . Herpes genitalia   . Hyperlipidemia   . Hypertension   . Shellfish allergy   . Shingles 2012   right flank    Past Surgical History:  Procedure Laterality Date  . CHOLECYSTECTOMY N/A 07/05/2016   Procedure: LAPAROSCOPIC CHOLECYSTECTOMY;  Surgeon: Tracy Skeans, MD;  Location: [place];  Service: General;  Laterality: N/A;  . colonscopy  05/20/09   per Dr. Deatra Ina, hyperplstic polyps, repeat in 10 yers   . TYMPANOSTOMY TUBE PLACEMENT       Current Outpatient Medications:  .  buPROPion (WELLBUTRIN SR) 150 MG 12 hr tablet, Take 1 tablet (150 mg total) by mouth daily., Disp: 30 tablet, Rfl: 0 .  estradiol (ESTRACE VAGINAL) 0.1 MG/GM vaginal cream, Place 1 Applicatorful vaginally at bedtime. (Patient taking differently: Place 1 Applicatorful vaginally at bedtime. 2 grams), Disp: 42.5 g, Rfl: 12 .  FARXIGA 10 MG TABS tablet, Take 10 mg by mouth daily., Disp: 30 tablet, Rfl: 0 .  glucose blood (ACCU-CHEK GUIDE) test strip, Use 2 X a day and diagnosis code is E 11.9, Disp: 100 each, Rfl: 0 .  Insulin Pen Needle 32G X 4 MM MISC, 1 Package by Does not apply  route daily at 12 noon., Disp: 100 each, Rfl: 0 .  Lancets (ACCU-CHEK SOFT TOUCH) lancets, Use 2 X a day and diagnosis code is E 11.9, Disp: 100 each, Rfl: 0 .  liraglutide (VICTOZA) 18 MG/3ML SOPN, Inject 0.2 mLs (1.2 mg total) into the skin every morning., Disp: 2 pen, Rfl: 0 .  losartan (COZAAR) 50 MG tablet, Take 1 tablet (50 mg total) by mouth daily., Disp: 30 tablet, Rfl: 0 .  metFORMIN (GLUCOPHAGE) 500 MG tablet, Take 500mg  in the am by mouth and 1000mg  in the pm daily, Disp: 60 tablet, Rfl: 0 .  Vitamin D, Ergocalciferol, (DRISDOL) 50000 units CAPS capsule, Take 1 capsule (50,000 Units total) by mouth every 7 (seven) days., Disp: 4 capsule, Rfl: 0  Allergies  Allergen Reactions  . Lisinopril Cough  . Shrimp [Shellfish Allergy] Diarrhea    Severe stomach cramps and diarrhea  . Clarithromycin Other (See Comments)    UNSPECIFIED     Social History   Socioeconomic History  . Marital status: Married    Spouse name: Tracy Anderson  . Number of children: Not on file  . Years of education: Not on file  . Highest education level: Not on file  Social Needs  . Financial resource strain:  Not on file  . Food insecurity - worry: Not on file  . Food insecurity - inability: Not on file  . Transportation needs - medical: Not on file  . Transportation needs - non-medical: Not on file  Occupational History  . Occupation: IT consultant  Tobacco Use  . Smoking status: Former Smoker    Last attempt to quit: 05/05/1991    Years since quitting: 26.5  . Smokeless tobacco: Never Used  Substance and Sexual Activity  . Alcohol use: Yes    Alcohol/week: 1.2 oz    Types: 2 Standard drinks or equivalent per week    Comment: occ  . Drug use: No  . Sexual activity: Not on file  Other Topics Concern  . Not on file  Social History Narrative  . Not on file         Objective:  Physical Exam  General: AAO x3, NAD  Dermatological: Skin is warm, dry and supple bilateral. Nails x 10 are well  manicured; remaining integument appears unremarkable at this time. There are no open sores, no preulcerative lesions, no rash or signs of infection present.  Vascular: Dorsalis Pedis artery and Posterior Tibial artery pedal pulses are 2/4 bilateral with immedate capillary fill time. Pedal hair growth present. No varicosities and no lower extremity edema present bilateral. There is no pain with calf compression, swelling, warmth, erythema.   Neruologic: Grossly intact via light touch bilateral. Protective threshold with Semmes Wienstein monofilament intact to all pedal sites bilateral.  Musculoskeletal: There is decreased range of motion of left first MTPJ mild crepitation is present.  There is dorsal spurring present at the first MTPJ.  There is trace edema but there is no erythema or increase in warmth.  There is no pain to the second metatarsal or other areas of the foot or ankle.  There is no pain with MPJ range of motion on the right side.  Ankle, subtalar joint range of motion intact bilaterally.  Muscular strength 5/5 in all groups tested bilateral.  Gait: Unassisted, Nonantalgic.       Assessment:   Capsulitis, hallux limitus left side first MPJ    Plan:  -Treatment options discussed including all alternatives, risks, and complications -Etiology of symptoms were discussed -X-rays were obtained and reviewed.  Arthritic changes present the first MTPJ on the left side there is a large dorsal spur present the first metatarsal head.  There is no evidence of acute fracture. -We discussed both conservative as well as surgical treatment options.  Conservatively we discussed a steroid injection which she wishes to hold off.  We discussed the change in shoes to include a stiffer soled shoe we also discussed a custom orthotic to help take pressure of the first MPJ.  She would like to proceed with custom orthotics and will order her for the inserts today. -We also discussed surgical intervention if  symptoms continue.  We discussed the first MTPJ arthrodesis versus implant.  We discussed pros and cons of both of the surgeries.  Discussed with her that MPJ arthrodesis will likely get better long-term success she is presently not fusing the joint should likely proceed with implant but she will consider her options. -Follow-up in 3-4 weeks to pick up inserts or sooner if needed.  She agrees this plan she has no further questions or concerns today.  Trula Slade DPM

## 2017-11-20 ENCOUNTER — Telehealth: Payer: Self-pay | Admitting: Podiatry

## 2017-11-20 NOTE — Telephone Encounter (Signed)
Left message for pt to call to discuss orthotic coverage per Dr Wagoner. °

## 2017-11-23 NOTE — Telephone Encounter (Signed)
Pt returned call and is going to discuss orthotic coverage with husband and will call next week to let me know if she wants to proceed

## 2017-11-28 NOTE — Telephone Encounter (Signed)
Pt called and wants to proceed with ordering full length orthotics for tennis shoes.

## 2017-12-04 ENCOUNTER — Ambulatory Visit (INDEPENDENT_AMBULATORY_CARE_PROVIDER_SITE_OTHER): Payer: 59 | Admitting: Family Medicine

## 2017-12-04 VITALS — BP 144/80 | HR 87 | Temp 97.5°F | Ht 67.0 in | Wt 181.0 lb

## 2017-12-04 DIAGNOSIS — F3289 Other specified depressive episodes: Secondary | ICD-10-CM

## 2017-12-04 DIAGNOSIS — E119 Type 2 diabetes mellitus without complications: Secondary | ICD-10-CM | POA: Diagnosis not present

## 2017-12-04 DIAGNOSIS — Z6830 Body mass index (BMI) 30.0-30.9, adult: Secondary | ICD-10-CM | POA: Diagnosis not present

## 2017-12-04 DIAGNOSIS — E669 Obesity, unspecified: Secondary | ICD-10-CM

## 2017-12-04 DIAGNOSIS — E559 Vitamin D deficiency, unspecified: Secondary | ICD-10-CM | POA: Diagnosis not present

## 2017-12-04 DIAGNOSIS — F329 Major depressive disorder, single episode, unspecified: Secondary | ICD-10-CM | POA: Insufficient documentation

## 2017-12-04 DIAGNOSIS — F32A Depression, unspecified: Secondary | ICD-10-CM | POA: Insufficient documentation

## 2017-12-04 MED ORDER — VITAMIN D (ERGOCALCIFEROL) 1.25 MG (50000 UNIT) PO CAPS: 50000.0000 [IU] | ORAL_CAPSULE | ORAL | 0 refills | Status: DC

## 2017-12-04 MED ORDER — BUPROPION HCL ER (SR) 150 MG PO TB12: 150.0000 mg | ORAL_TABLET | Freq: Every day | ORAL | 0 refills | Status: DC

## 2017-12-05 NOTE — Progress Notes (Signed)
Office: [phone]  /  Fax: [phone]   HPI:   Chief Complaint: OBESITY Tracy Anderson is here to discuss her progress with her obesity treatment plan. She is on the Category 2 plan and is following her eating plan approximately 70 % of the time. She states she is exercising 0 minutes 0 times per week. Rosaly has done better with weight loss over the last 2 weeks. She is getting bored with dinner though and would like to discuss further options.  Her weight is 181 lb (82.1 kg) today and has had a weight loss of 2 pounds over a period of 2 weeks since her last visit. She has lost 4 lbs since starting treatment with [place].  Diabetes II Zandria has a diagnosis of diabetes type II. Monti states fasting BGs range between 116 and 120, on metformin, Victoza, and [place]. She increased Victoza to 1.2 mg last visit and feels her polyphagia is improving. She did not bring BGs log today and denies any hypoglycemic episodes. Last A1c was 7.0 on 10/23/17. She has been working on intensive lifestyle modifications including diet, exercise, and weight loss to help control her blood glucose levels.  Vitamin D deficiency Gerlene has a diagnosis of vitamin D deficiency. She is stable on prescription Vit D. She notes fatigue and denies nausea, vomiting or muscle weakness.  Depression with emotional eating behaviors Calle's mood has improved on Wellbutrin, she denies insomnia but blood pressure is slightly elevated today. Olimpia struggles with emotional eating and using food for comfort to the extent that it is negatively impacting her health. She often snacks when she is not hungry. Makenley sometimes feels she is out of control and then feels guilty that she made poor food choices. She has been working on behavior modification techniques to help reduce her emotional eating and has been somewhat successful. She shows no sign of suicidal or homicidal ideations.  Depression screen PHQ 2/9 07/17/2017    Decreased Interest 1  Down, Depressed, Hopeless 1  PHQ - 2 Score 2  Altered sleeping 2  Tired, decreased energy 1  Change in appetite 1  Feeling bad or failure about yourself  2  Trouble concentrating 0  Moving slowly or fidgety/restless 0  Suicidal thoughts 0  PHQ-9 Score 8  Difficult doing work/chores Not difficult at all   ALLERGIES: Allergies  Allergen Reactions  . Lisinopril Cough  . Shrimp [Shellfish Allergy] Diarrhea    Severe stomach cramps and diarrhea  . Clarithromycin Other (See Comments)    UNSPECIFIED     MEDICATIONS: Current Outpatient Medications on File Prior to Visit  Medication Sig Dispense Refill  . estradiol (ESTRACE VAGINAL) 0.1 MG/GM vaginal cream Place 1 Applicatorful vaginally at bedtime. (Patient taking differently: Place 1 Applicatorful vaginally at bedtime. 2 grams) 42.5 g 12  . FARXIGA 10 MG TABS tablet Take 10 mg by mouth daily. 30 tablet 0  . glucose blood (ACCU-CHEK GUIDE) test strip Use 2 X a day and diagnosis code is E 11.9 100 each 0  . Insulin Pen Needle 32G X 4 MM MISC 1 Package by Does not apply route daily at 12 noon. 100 each 0  . Lancets (ACCU-CHEK SOFT TOUCH) lancets Use 2 X a day and diagnosis code is E 11.9 100 each 0  . liraglutide (VICTOZA) 18 MG/3ML SOPN Inject 0.2 mLs (1.2 mg total) into the skin every morning. 2 pen 0  . losartan (COZAAR) 50 MG tablet Take 1 tablet (50 mg total) by mouth daily. [place]  tablet 0  . metFORMIN (GLUCOPHAGE) 500 MG tablet Take 500mg  in the am by mouth and 1000mg  in the pm daily 60 tablet 0   No current facility-administered medications on file prior to visit.     PAST MEDICAL HISTORY: Past Medical History:  Diagnosis Date  . Arthritis   . Asthma    as a child  . Back pain   . Depression   . Diabetes mellitus type II   . Herpes genitalia   . Hyperlipidemia   . Hypertension   . Shellfish allergy   . Shingles 2012   right flank    PAST SURGICAL HISTORY: Past Surgical History:  Procedure  Laterality Date  . CHOLECYSTECTOMY N/A 07/05/2016   Procedure: LAPAROSCOPIC CHOLECYSTECTOMY;  Surgeon: Georganna Skeans, MD;  Location: [facility];  Service: General;  Laterality: N/A;  . colonscopy  05/20/09   per Dr. Deatra Ina, hyperplstic polyps, repeat in 10 yers   . TYMPANOSTOMY TUBE PLACEMENT      SOCIAL HISTORY: Social History   Tobacco Use  . Smoking status: Former Smoker    Last attempt to quit: 05/05/1991    Years since quitting: 26.6  . Smokeless tobacco: Never Used  Substance Use Topics  . Alcohol use: Yes    Alcohol/week: 1.2 oz    Types: 2 Standard drinks or equivalent per week    Comment: occ  . Drug use: No    FAMILY HISTORY: Family History  Problem Relation Age of Onset  . Arthritis Other   . Cancer Other        cervical  . Depression Other   . Diabetes Other   . Ulcers Other   . Breast cancer Maternal Grandmother   . Diabetes Mother   . Heart disease Mother   . Cancer Mother   . Depression Mother   . Alcoholism Mother   . Eating disorder Mother   . Diabetes Father   . Hypertension Father   . Depression Father   . Alcoholism Father     ROS: Review of Systems  Constitutional: Positive for malaise/fatigue and weight loss.  Gastrointestinal: Negative for nausea and vomiting.  Musculoskeletal:       Negative muscle weakness  Endo/Heme/Allergies:       Positive polyphagia Negative hypoglycemia  Psychiatric/Behavioral: Positive for depression. Negative for suicidal ideas. The patient does not have insomnia.     PHYSICAL EXAM: Blood pressure (!) 144/80, pulse 87, temperature (!) 97.5 F (36.4 C), temperature source Oral, height 5\' 7"  (1.702 m), weight 181 lb (82.1 kg), last menstrual period 08/08/2012, SpO2 99 %. Body mass index is 28.35 kg/m. Physical Exam  Constitutional: She is oriented to person, place, and time. She appears well-developed and well-nourished.  Cardiovascular: Normal rate.  Pulmonary/Chest: Effort normal.  Musculoskeletal: Normal  range of motion.  Neurological: She is oriented to person, place, and time.  Skin: Skin is warm and dry.  Psychiatric: She has a normal mood and affect. Her behavior is normal.  Vitals reviewed.   RECENT LABS AND TESTS: BMET    Component Value Date/Time   NA 143 10/23/2017 0826   K 4.8 10/23/2017 0826   CL 102 10/23/2017 0826   CO2 25 10/23/2017 0826   GLUCOSE 109 (H) 10/23/2017 0826   GLUCOSE 127 (H) 04/23/2017 0925   BUN 14 10/23/2017 0826   CREATININE 0.77 10/23/2017 0826   CALCIUM 9.6 10/23/2017 0826   GFRNONAA 85 10/23/2017 0826   GFRAA 98 10/23/2017 0826   Lab Results  Component Value Date   HGBA1C 7.0 (H) 10/23/2017   HGBA1C 7.0 (H) 07/18/2017   HGBA1C 7.6 (H) 04/23/2017   HGBA1C 7.5 (H) 01/11/2017   HGBA1C 7.8 (H) 10/02/2016   Lab Results  Component Value Date   INSULIN 11.5 10/23/2017   INSULIN 11.2 07/18/2017   CBC    Component Value Date/Time   WBC 5.5 07/18/2017 0815   WBC 6.2 04/23/2017 0925   RBC 4.60 07/18/2017 0815   RBC 4.78 04/23/2017 0925   HGB 13.1 07/18/2017 0815   HCT 40.1 07/18/2017 0815   PLT 250.0 04/23/2017 0925   MCV 87 07/18/2017 0815   MCH 28.5 07/18/2017 0815   MCH 28.2 06/27/2016 0906   MCHC 32.7 07/18/2017 0815   MCHC 32.8 04/23/2017 0925   RDW 13.4 07/18/2017 0815   LYMPHSABS 1.6 07/18/2017 0815   MONOABS 0.4 04/23/2017 0925   EOSABS 0.1 07/18/2017 0815   BASOSABS 0.0 07/18/2017 0815   Iron/TIBC/Ferritin/ %Sat No results found for: IRON, TIBC, FERRITIN, IRONPCTSAT Lipid Panel     Component Value Date/Time   CHOL 155 10/23/2017 0826   TRIG 144 10/23/2017 0826   HDL 53 10/23/2017 0826   CHOLHDL 4 04/23/2017 0925   VLDL 47.2 (H) 04/23/2017 0925   LDLCALC 73 10/23/2017 0826   LDLDIRECT 82.0 04/23/2017 0925   Hepatic Function Panel     Component Value Date/Time   PROT 7.1 10/23/2017 0826   ALBUMIN 4.6 10/23/2017 0826   AST 13 10/23/2017 0826   ALT 20 10/23/2017 0826   ALKPHOS 74 10/23/2017 0826   BILITOT 0.5  10/23/2017 0826   BILIDIR 0.1 04/23/2017 0925      Component Value Date/Time   TSH 2.130 07/18/2017 0815   TSH 1.15 04/23/2017 0925   TSH 1.77 03/21/2016 0732  Results for DEZIREA, MCCOLLISTER (MRN [id number]) as of 12/05/2017 09:54  Ref. Range 10/23/2017 08:26  Vitamin D, 25-Hydroxy Latest Ref Range: 30.0 - 100.0 ng/mL 51.3    ASSESSMENT AND PLAN: Type 2 diabetes mellitus without complication, without long-term current use of insulin (HCC)  Vitamin D deficiency - Plan: Vitamin D, Ergocalciferol, (DRISDOL) 50000 units CAPS capsule  Other depression - with emotional eating - Plan: buPROPion (WELLBUTRIN SR) 150 MG 12 hr tablet  Class 1 obesity with serious comorbidity and body mass index (BMI) of 30.0 to 30.9 in adult, unspecified obesity type - Starting BMI greater then 30  PLAN:  Diabetes II Chemeka has been given extensive diabetes education by myself today including ideal fasting and post-prandial blood glucose readings, individual ideal Hgb A1c goals and hypoglycemia prevention. We discussed the importance of good blood sugar control to decrease the likelihood of diabetic complications such as nephropathy, neuropathy, limb loss, blindness, coronary artery disease, and death. We discussed the importance of intensive lifestyle modification including diet, exercise and weight loss as the first line treatment for diabetes. Lakyra agrees to continue Victoza at 1.2 mg and continue diet and exercise. We will recheck labs in 1 month and Shakeita agrees to follow up with our clinic in 2 weeks.  Vitamin D Deficiency Ovella was informed that low vitamin D levels contributes to fatigue and are associated with obesity, breast, and colon cancer. Fanny agrees to continue taking prescription Vit D @50 ,000 IU every week #4 and we will refill for 1 month. She will follow up for routine testing of vitamin D, at least 2-3 times per year. She was informed of the risk of over-replacement of vitamin D  and agrees to  not increase her dose unless she discusses this with [place] first. Kamica agrees to follow up with our clinic in 2 weeks.  Depression with Emotional Eating Behaviors We discussed behavior modification techniques today to help Keiera deal with her emotional eating and depression. Lakisha agrees to continue taking Wellbutrin SR 150 mg qd #30 and we will refill for 1 month. Audryna agrees to follow up with our clinic in 2 weeks and we will recheck blood pressure at that time.  Obesity Janita is currently in the action stage of change. As such, her goal is to continue with weight loss efforts She has agreed to keep a food journal with 400-500 calories and 35 grams of protein at supper daily and follow the Category 2 plan Mileena has been instructed to work up to a goal of 150 minutes of combined cardio and strengthening exercise per week for weight loss and overall health benefits. We discussed the following Behavioral Modification Strategies today: increasing lean protein intake and decreasing simple carbohydrates    Akira has agreed to follow up with our clinic in 2 weeks. She was informed of the importance of frequent follow up visits to maximize her success with intensive lifestyle modifications for her multiple health conditions.   OBESITY BEHAVIORAL INTERVENTION VISIT  Today's visit was # 9 out of 22.  Starting weight: 184 lbs Starting date: 07/17/17 Today's weight : 181 lbs  Today's date: 12/04/2017 Total lbs lost to date: 3 (Patients must lose 7 lbs in the first 6 months to continue with counseling)   ASK: We discussed the diagnosis of obesity with Yolonda Kida today and Joelene Millin agreed to give [place] permission to discuss obesity behavioral modification therapy today.  ASSESS: Mabrey has the diagnosis of obesity and her BMI today is 28.34 Lavita is in the action stage of change   ADVISE: Preslyn was educated on the multiple health risks of obesity  as well as the benefit of weight loss to improve her health. She was advised of the need for long term treatment and the importance of lifestyle modifications.  AGREE: Multiple dietary modification options and treatment options were discussed and  Tierany agreed to the above obesity treatment plan.  I, Trixie Dredge, am acting as transcriptionist for Dennard Nip, MD  I have reviewed the above documentation for accuracy and completeness, and I agree with the above. -Dennard Nip, MD

## 2017-12-06 MED FILL — VIT D2 1.25 MG (50,000 UNIT: 1.25 MG | 28 days supply | Qty: 4 | Fill #0

## 2017-12-13 MED FILL — BUPROPION SR 150 MG TABLET: 150 | 30 days supply | Qty: 30 | Fill #0

## 2017-12-18 ENCOUNTER — Ambulatory Visit (INDEPENDENT_AMBULATORY_CARE_PROVIDER_SITE_OTHER): Payer: 59 | Admitting: Family Medicine

## 2017-12-19 ENCOUNTER — Ambulatory Visit (INDEPENDENT_AMBULATORY_CARE_PROVIDER_SITE_OTHER): Payer: 59 | Admitting: Family Medicine

## 2017-12-19 VITALS — BP 134/76 | HR 68 | Temp 97.5°F | Ht 67.0 in | Wt 180.0 lb

## 2017-12-19 DIAGNOSIS — E119 Type 2 diabetes mellitus without complications: Secondary | ICD-10-CM

## 2017-12-19 DIAGNOSIS — Z6830 Body mass index (BMI) 30.0-30.9, adult: Secondary | ICD-10-CM | POA: Diagnosis not present

## 2017-12-19 DIAGNOSIS — F3289 Other specified depressive episodes: Secondary | ICD-10-CM

## 2017-12-19 DIAGNOSIS — E669 Obesity, unspecified: Secondary | ICD-10-CM

## 2017-12-19 DIAGNOSIS — I10 Essential (primary) hypertension: Secondary | ICD-10-CM | POA: Diagnosis not present

## 2017-12-19 DIAGNOSIS — Z9189 Other specified personal risk factors, not elsewhere classified: Secondary | ICD-10-CM | POA: Diagnosis not present

## 2017-12-19 MED ORDER — BUPROPION HCL ER (SR) 150 MG PO TB12: 150.0000 mg | ORAL_TABLET | Freq: Two times a day (BID) | ORAL | 0 refills | Status: DC

## 2017-12-19 MED ORDER — FARXIGA 10 MG PO TABS: 10.0000 mg | ORAL_TABLET | Freq: Every day | ORAL | 0 refills | Status: DC

## 2017-12-19 MED ORDER — LOSARTAN POTASSIUM 50 MG PO TABS: 50.0000 mg | ORAL_TABLET | Freq: Every day | ORAL | 0 refills | Status: DC

## 2017-12-19 MED ORDER — LIRAGLUTIDE 18 MG/3ML ~~LOC~~ SOPN: 1.2000 mg | PEN_INJECTOR | Freq: Every morning | SUBCUTANEOUS | 0 refills | Status: DC

## 2017-12-19 MED FILL — VICTOZA 2-PAK 18 MG/3 ML PE: 18 | 30 days supply | Qty: 6 | Fill #0

## 2017-12-19 MED FILL — LOSARTAN POTASSIUM 50 MG TA: 50 | 30 days supply | Qty: 30 | Fill #0

## 2017-12-19 MED FILL — FARXIGA 10 MG TABLET: 10 | 30 days supply | Qty: 30 | Fill #0

## 2017-12-19 NOTE — Progress Notes (Signed)
Office: [phone]  /  Fax: [phone]   HPI:   Chief Complaint: OBESITY Tracy Anderson is here to discuss her progress with her obesity treatment plan. She is on the keep a food journal with 400-500 calories and 35 grams of protein at supper daily and follow the Category 2 plan and is following her eating plan approximately 50 % of the time. She states she is exercising 0 minutes 0 times per week. Tracy Anderson continues to lose weight slowly, she is working on [employer] and journaling on and off. Sometimes guessing calories too much and not always meeting protein goal.  Her weight is 180 lb (81.6 kg) today and has had a weight loss of 1 pound over a period of 2 weeks since her last visit. She has lost 4 lbs since starting treatment with [place].  Diabetes II Tracy Anderson has a diagnosis of diabetes type II. Tracy Anderson is on metformin, Farxiga, and Victoza, she notes polyphagia. Fasting BGs range between 120 and 140 and 2 hour post prandial range between 140 and 160's on an average. She denies any hypoglycemic episodes. Last A1c was 7.0 on 10/23/17. She has been working on intensive lifestyle modifications including diet, exercise, and weight loss to help control her blood glucose levels.  Hypertension Tracy Anderson is a 57 y.o. female with hypertension. Tracy Anderson's blood pressure is stable on losartan and she denies chest pain or headache. She is working weight loss to help control her blood pressure with the goal of decreasing her risk of heart attack and stroke. Tracy Anderson's blood pressure is currently controlled.  At risk for cardiovascular disease Tracy Anderson is at a higher than average risk for cardiovascular disease due to obesity, diabetes II, and hypertension. She currently denies any chest pain.  Depression with emotional eating behaviors Tracy Anderson's mood is stable on Wellbutrin and feels more in control of her emotional eating. She is still feeling a bit frustrated at her job especially with  all the recent changes. Tracy Anderson struggles with emotional eating and using food for comfort to the extent that it is negatively impacting her health. She often snacks when she is not hungry. Tracy Anderson sometimes feels she is out of control and then feels guilty that she made poor food choices. She has been working on behavior modification techniques to help reduce her emotional eating and has been somewhat successful. She shows no sign of suicidal or homicidal ideations.  Depression screen PHQ 2/9 07/17/2017  Decreased Interest 1  Down, Depressed, Hopeless 1  PHQ - 2 Score 2  Altered sleeping 2  Tired, decreased energy 1  Change in appetite 1  Feeling bad or failure about yourself  2  Trouble concentrating 0  Moving slowly or fidgety/restless 0  Suicidal thoughts 0  PHQ-9 Score 8  Difficult doing work/chores Not difficult at all   ALLERGIES: Allergies  Allergen Reactions  . Lisinopril Cough  . Shrimp [Shellfish Allergy] Diarrhea    Severe stomach cramps and diarrhea  . Clarithromycin Other (See Comments)    UNSPECIFIED     MEDICATIONS: Current Outpatient Medications on File Prior to Visit  Medication Sig Dispense Refill  . buPROPion (WELLBUTRIN SR) 150 MG 12 hr tablet Take 1 tablet (150 mg total) by mouth daily. 30 tablet 0  . estradiol (ESTRACE VAGINAL) 0.1 MG/GM vaginal cream Place 1 Applicatorful vaginally at bedtime. (Patient taking differently: Place 1 Applicatorful vaginally at bedtime. 2 grams) 42.5 g 12  . FARXIGA 10 MG TABS tablet Take 10 mg by mouth daily. [place]  tablet 0  . glucose blood (ACCU-CHEK GUIDE) test strip Use 2 X a day and diagnosis code is E 11.9 100 each 0  . Insulin Pen Needle 32G X 4 MM MISC 1 Package by Does not apply route daily at 12 noon. 100 each 0  . Lancets (ACCU-CHEK SOFT TOUCH) lancets Use 2 X a day and diagnosis code is E 11.9 100 each 0  . liraglutide (VICTOZA) 18 MG/3ML SOPN Inject 0.2 mLs (1.2 mg total) into the skin every morning. 2 pen 0  .  losartan (COZAAR) 50 MG tablet Take 1 tablet (50 mg total) by mouth daily. 30 tablet 0  . metFORMIN (GLUCOPHAGE) 500 MG tablet Take 500mg  in the am by mouth and 1000mg  in the pm daily 60 tablet 0  . Vitamin D, Ergocalciferol, (DRISDOL) 50000 units CAPS capsule Take 1 capsule (50,000 Units total) by mouth every 7 (seven) days. 4 capsule 0   No current facility-administered medications on file prior to visit.     PAST MEDICAL HISTORY: Past Medical History:  Diagnosis Date  . Arthritis   . Asthma    as a child  . Back pain   . Depression   . Diabetes mellitus type II   . Herpes genitalia   . Hyperlipidemia   . Hypertension   . Shellfish allergy   . Shingles 2012   right flank    PAST SURGICAL HISTORY: Past Surgical History:  Procedure Laterality Date  . CHOLECYSTECTOMY N/A 07/05/2016   Procedure: LAPAROSCOPIC CHOLECYSTECTOMY;  Surgeon: Georganna Skeans, MD;  Location: [facility];  Service: General;  Laterality: N/A;  . colonscopy  05/20/09   per Dr. Deatra Ina, hyperplstic polyps, repeat in 10 yers   . TYMPANOSTOMY TUBE PLACEMENT      SOCIAL HISTORY: Social History   Tobacco Use  . Smoking status: Former Smoker    Last attempt to quit: 05/05/1991    Years since quitting: 26.6  . Smokeless tobacco: Never Used  Substance Use Topics  . Alcohol use: Yes    Alcohol/week: 1.2 oz    Types: 2 Standard drinks or equivalent per week    Comment: occ  . Drug use: No    FAMILY HISTORY: Family History  Problem Relation Age of Onset  . Arthritis Other   . Cancer Other        cervical  . Depression Other   . Diabetes Other   . Ulcers Other   . Breast cancer Maternal Grandmother   . Diabetes Mother   . Heart disease Mother   . Cancer Mother   . Depression Mother   . Alcoholism Mother   . Eating disorder Mother   . Diabetes Father   . Hypertension Father   . Depression Father   . Alcoholism Father     ROS: Review of Systems  Constitutional: Positive for weight loss.    Cardiovascular: Negative for chest pain.  Neurological: Negative for headaches.  Endo/Heme/Allergies:       Negative hypoglycemia  Psychiatric/Behavioral: Positive for depression. Negative for suicidal ideas.    PHYSICAL EXAM: Blood pressure 134/76, pulse 68, temperature (!) 97.5 F (36.4 C), temperature source Oral, height 5\' 7"  (1.702 Anderson), weight 180 lb (81.6 kg), last menstrual period 08/08/2012, SpO2 98 %. Body mass index is 28.19 kg/Anderson. Physical Exam  Constitutional: She is oriented to person, place, and time. She appears well-developed and well-nourished.  Cardiovascular: Normal rate.  Pulmonary/Chest: Effort normal.  Musculoskeletal: Normal range of motion.  Neurological: She is oriented  to person, place, and time.  Skin: Skin is warm and dry.  Psychiatric: She has a normal mood and affect. Her behavior is normal.  Vitals reviewed.   RECENT LABS AND TESTS: BMET    Component Value Date/Time   NA 143 10/23/2017 0826   K 4.8 10/23/2017 0826   CL 102 10/23/2017 0826   CO2 25 10/23/2017 0826   GLUCOSE 109 (H) 10/23/2017 0826   GLUCOSE 127 (H) 04/23/2017 0925   BUN 14 10/23/2017 0826   CREATININE 0.77 10/23/2017 0826   CALCIUM 9.6 10/23/2017 0826   GFRNONAA 85 10/23/2017 0826   GFRAA 98 10/23/2017 0826   Lab Results  Component Value Date   HGBA1C 7.0 (H) 10/23/2017   HGBA1C 7.0 (H) 07/18/2017   HGBA1C 7.6 (H) 04/23/2017   HGBA1C 7.5 (H) 01/11/2017   HGBA1C 7.8 (H) 10/02/2016   Lab Results  Component Value Date   INSULIN 11.5 10/23/2017   INSULIN 11.2 07/18/2017   CBC    Component Value Date/Time   WBC 5.5 07/18/2017 0815   WBC 6.2 04/23/2017 0925   RBC 4.60 07/18/2017 0815   RBC 4.78 04/23/2017 0925   HGB 13.1 07/18/2017 0815   HCT 40.1 07/18/2017 0815   PLT 250.0 04/23/2017 0925   MCV 87 07/18/2017 0815   MCH 28.5 07/18/2017 0815   MCH 28.2 06/27/2016 0906   MCHC 32.7 07/18/2017 0815   MCHC 32.8 04/23/2017 0925   RDW 13.4 07/18/2017 0815    LYMPHSABS 1.6 07/18/2017 0815   MONOABS 0.4 04/23/2017 0925   EOSABS 0.1 07/18/2017 0815   BASOSABS 0.0 07/18/2017 0815   Iron/TIBC/Ferritin/ %Sat No results found for: IRON, TIBC, FERRITIN, IRONPCTSAT Lipid Panel     Component Value Date/Time   CHOL 155 10/23/2017 0826   TRIG 144 10/23/2017 0826   HDL 53 10/23/2017 0826   CHOLHDL 4 04/23/2017 0925   VLDL 47.2 (H) 04/23/2017 0925   LDLCALC 73 10/23/2017 0826   LDLDIRECT 82.0 04/23/2017 0925   Hepatic Function Panel     Component Value Date/Time   PROT 7.1 10/23/2017 0826   ALBUMIN 4.6 10/23/2017 0826   AST 13 10/23/2017 0826   ALT 20 10/23/2017 0826   ALKPHOS 74 10/23/2017 0826   BILITOT 0.5 10/23/2017 0826   BILIDIR 0.1 04/23/2017 0925      Component Value Date/Time   TSH 2.130 07/18/2017 0815   TSH 1.15 04/23/2017 0925   TSH 1.77 03/21/2016 0732    ASSESSMENT AND PLAN: Type 2 diabetes mellitus without complication, without long-term current use of insulin (Tracy Anderson) - Plan: liraglutide (VICTOZA) 18 MG/3ML SOPN, FARXIGA 10 MG TABS tablet  Essential hypertension - Plan: losartan (COZAAR) 50 MG tablet  Other depression - with emotional eating - Plan: buPROPion (WELLBUTRIN SR) 150 MG 12 hr tablet  At risk for heart disease  Class 1 obesity with serious comorbidity and body mass index (BMI) of 30.0 to 30.9 in adult, unspecified obesity type - Starting BMI greater then 30  PLAN:  Diabetes II Tracy Anderson has been given extensive diabetes education by myself today including ideal fasting and post-prandial blood glucose readings, individual ideal Hgb A1c goals and hypoglycemia prevention. We discussed the importance of good blood sugar control to decrease the likelihood of diabetic complications such as nephropathy, neuropathy, limb loss, blindness, coronary artery disease, and death. We discussed the importance of intensive lifestyle modification including diet, exercise and weight loss as the first line treatment for diabetes.  Tracy Anderson agrees to increase Victoza to 1.8  mg #3 pen box with no refills and she agrees to continue Farxiga 10 mg qd #30 and we will refill for 1 month. Tracy Anderson agrees to follow up with our clinic in 2 weeks.  Hypertension We discussed sodium restriction, working on healthy weight loss, and a regular exercise program as the means to achieve improved blood pressure control. Tracy Anderson agreed with this plan and agreed to follow up as directed. We will continue to monitor her blood pressure as well as her progress with the above lifestyle modifications. Tracy Anderson agrees to continue taking losartan 50 mg qd #30 and we will refill for 1 month, and she will continue diet and exercise and will watch for signs of hypotension as she continues her lifestyle modifications. Tracy Anderson agrees to follow up with our clinic in 2 weeks.  Cardiovascular risk counselling Tatym was given extended (15 minutes) coronary artery disease prevention counseling today. She is 57 y.o. female and has risk factors for heart disease including obesity, diabetes II, and hypertension. We discussed intensive lifestyle modifications today with an emphasis on specific weight loss instructions and strategies. Pt was also informed of the importance of increasing exercise and decreasing saturated fats to help prevent heart disease.  Depression with Emotional Eating Behaviors We discussed behavior modification techniques today to help Tracy Anderson deal with her emotional eating and depression. Tracy Anderson agrees to increase Wellbutrin SR to 150 mg BID #60 with no refills. Tracy Anderson agrees to follow up with our clinic in 2 weeks.  Obesity Tracy Anderson is currently in the action stage of change. As such, her goal is to continue with weight loss efforts She has agreed to keep a food journal with 1200-1400 calories and 75+ grams of protein daily Tracy Anderson has been instructed to work up to a goal of 150 minutes of combined cardio and strengthening exercise per  week for weight loss and overall health benefits. We discussed the following Behavioral Modification Strategies today: increasing lean protein intake and work on meal planning and easy cooking plans   Chariah has agreed to follow up with our clinic in 2 weeks. She was informed of the importance of frequent follow up visits to maximize her success with intensive lifestyle modifications for her multiple health conditions.   OBESITY BEHAVIORAL INTERVENTION VISIT  Today's visit was # 10 out of 22.  Starting weight: 184 lbs Starting date: 07/17/17 Today's weight : 180 lbs  Today's date: 12/19/2017 Total lbs lost to date: 4 (Patients must lose 7 lbs in the first 6 months to continue with counseling)   ASK: We discussed the diagnosis of obesity with Yolonda Kida today and Joelene Millin agreed to give [place] permission to discuss obesity behavioral modification therapy today.  ASSESS: Samaa has the diagnosis of obesity and her BMI today is 28.19 Lakyra is in the action stage of change   ADVISE: Lavell was educated on the multiple health risks of obesity as well as the benefit of weight loss to improve her health. She was advised of the need for long term treatment and the importance of lifestyle modifications.  AGREE: Multiple dietary modification options and treatment options were discussed and Madelyne agreed to the above obesity treatment plan.  I, Trixie Dredge, am acting as transcriptionist for Dennard Nip, MD  I have reviewed the above documentation for accuracy and completeness, and I agree with the above. -Dennard Nip, MD

## 2017-12-28 ENCOUNTER — Ambulatory Visit (INDEPENDENT_AMBULATORY_CARE_PROVIDER_SITE_OTHER): Payer: 59 | Admitting: Orthotics

## 2017-12-28 DIAGNOSIS — M778 Other enthesopathies, not elsewhere classified: Secondary | ICD-10-CM

## 2017-12-28 DIAGNOSIS — M7750 Other enthesopathy of unspecified foot: Secondary | ICD-10-CM

## 2017-12-28 DIAGNOSIS — M205X2 Other deformities of toe(s) (acquired), left foot: Secondary | ICD-10-CM

## 2017-12-28 DIAGNOSIS — M779 Enthesopathy, unspecified: Principal | ICD-10-CM

## 2017-12-28 NOTE — Addendum Note (Signed)
Addended by: Velora Heckler on: 12/28/2017 10:18 AM   Modules accepted: Level of Service

## 2017-12-28 NOTE — Progress Notes (Signed)
Patient came in today to pick up custom made foot orthotics.  The goals were accomplished and the patient reported no dissatisfaction with said orthotics.  Patient was advised of breakin period and how to report any issues. 

## 2018-01-02 DIAGNOSIS — S0501XA Injury of conjunctiva and corneal abrasion without foreign body, right eye, initial encounter: Secondary | ICD-10-CM | POA: Diagnosis not present

## 2018-01-02 DIAGNOSIS — T1511XA Foreign body in conjunctival sac, right eye, initial encounter: Secondary | ICD-10-CM | POA: Diagnosis not present

## 2018-01-02 MED FILL — ERYTHROMYCIN 0.5% EYE OINT: 5 | 6 days supply | Qty: 4 | Fill #0

## 2018-01-07 ENCOUNTER — Ambulatory Visit (INDEPENDENT_AMBULATORY_CARE_PROVIDER_SITE_OTHER): Payer: 59 | Admitting: Family Medicine

## 2018-01-07 VITALS — BP 134/74 | HR 79 | Temp 97.6°F | Ht 67.0 in | Wt 180.0 lb

## 2018-01-07 DIAGNOSIS — E119 Type 2 diabetes mellitus without complications: Secondary | ICD-10-CM

## 2018-01-07 DIAGNOSIS — Z9189 Other specified personal risk factors, not elsewhere classified: Secondary | ICD-10-CM

## 2018-01-07 DIAGNOSIS — F3289 Other specified depressive episodes: Secondary | ICD-10-CM | POA: Diagnosis not present

## 2018-01-07 DIAGNOSIS — E559 Vitamin D deficiency, unspecified: Secondary | ICD-10-CM

## 2018-01-07 DIAGNOSIS — E669 Obesity, unspecified: Secondary | ICD-10-CM

## 2018-01-07 DIAGNOSIS — Z6830 Body mass index (BMI) 30.0-30.9, adult: Secondary | ICD-10-CM

## 2018-01-07 MED ORDER — LIRAGLUTIDE 18 MG/3ML ~~LOC~~ SOPN: 1.8000 mg | PEN_INJECTOR | Freq: Every morning | SUBCUTANEOUS | 0 refills | Status: DC

## 2018-01-07 MED ORDER — VITAMIN D (ERGOCALCIFEROL) 1.25 MG (50000 UNIT) PO CAPS: 50000.0000 [IU] | ORAL_CAPSULE | ORAL | 0 refills | Status: DC

## 2018-01-07 MED ORDER — BUPROPION HCL ER (SR) 150 MG PO TB12: 150.0000 mg | ORAL_TABLET | Freq: Two times a day (BID) | ORAL | 0 refills | Status: DC

## 2018-01-07 MED FILL — BUPROPION SR 150 MG TABLET: 150 | 30 days supply | Qty: 60 | Fill #0

## 2018-01-07 MED FILL — VIT D2 1.25 MG (50,000 UNIT: 1.25 MG | 28 days supply | Qty: 4 | Fill #0

## 2018-01-07 NOTE — Progress Notes (Signed)
Office: [phone]  /  Fax: [phone]   HPI:   Chief Complaint: OBESITY Tracy Anderson is here to discuss her progress with her obesity treatment plan. She is on the keep a food journal with 1200-1400 calories and 75+ grams of protein daily and is following her eating plan approximately 40 % of the time. She states she is exercising 0 minutes 0 times per week. Tracy Anderson is journaling sometimes and struggling to meet her protein goals She does better on weekdays when she is in her normal routine but struggles more on weekends.  Her weight is 180 lb (81.6 kg) today and has not lost weight since her last visit. She has lost 4 lbs since starting treatment with [place].  Diabetes II Tracy Anderson has a diagnosis of diabetes type II. Danahi states fasting BGs range between 107 and 130 with increase in Victoza to 1.8. She denies nausea, vomiting, or hypoglycemia. Last A1c was 7.0 on 10/23/17. She has been working on intensive lifestyle modifications including diet, exercise, and weight loss to help control her blood glucose levels.  Vitamin D Deficiency Tracy Anderson has a diagnosis of vitamin D deficiency. She is stable on Vit D and she denies nausea, vomiting or muscle weakness. She notes fatigue is improving.  At risk for osteopenia and osteoporosis Tracy Anderson is at higher risk of osteopenia and osteoporosis due to vitamin D deficiency.   Depression with emotional eating behaviors Tracy Anderson increased Wellbutrin and feels it is helping with mood, fatigue, and emotional eating. Tracy Anderson struggles with emotional eating and using food for comfort to the extent that it is negatively impacting her health. She often snacks when she is not hungry. Tracy Anderson sometimes feels she is out of control and then feels guilty that she made poor food choices. She has been working on behavior modification techniques to help reduce her emotional eating and has been somewhat successful. She shows no sign of suicidal or homicidal  ideations.  Depression screen PHQ 2/9 07/17/2017  Decreased Interest 1  Down, Depressed, Hopeless 1  PHQ - 2 Score 2  Altered sleeping 2  Tired, decreased energy 1  Change in appetite 1  Feeling bad or failure about yourself  2  Trouble concentrating 0  Moving slowly or fidgety/restless 0  Suicidal thoughts 0  PHQ-9 Score 8  Difficult doing work/chores Not difficult at all   ALLERGIES: Allergies  Allergen Reactions  . Lisinopril Cough  . Shrimp [Shellfish Allergy] Diarrhea    Severe stomach cramps and diarrhea  . Clarithromycin Other (See Comments)    UNSPECIFIED     MEDICATIONS: Current Outpatient Medications on File Prior to Visit  Medication Sig Dispense Refill  . FARXIGA 10 MG TABS tablet Take 10 mg by mouth daily. 30 tablet 0  . glucose blood (ACCU-CHEK GUIDE) test strip Use 2 X a day and diagnosis code is E 11.9 100 each 0  . Insulin Pen Needle 32G X 4 MM MISC 1 Package by Does not apply route daily at 12 noon. 100 each 0  . Lancets (ACCU-CHEK SOFT TOUCH) lancets Use 2 X a day and diagnosis code is E 11.9 100 each 0  . losartan (COZAAR) 50 MG tablet Take 1 tablet (50 mg total) by mouth daily. 30 tablet 0  . metFORMIN (GLUCOPHAGE) 500 MG tablet Take 500mg  in the am by mouth and 1000mg  in the pm daily 60 tablet 0   No current facility-administered medications on file prior to visit.     PAST MEDICAL HISTORY: Past Medical History:  Diagnosis Date  . Arthritis   . Asthma    as a child  . Back pain   . Depression   . Diabetes mellitus type II   . Herpes genitalia   . Hyperlipidemia   . Hypertension   . Shellfish allergy   . Shingles 2012   right flank    PAST SURGICAL HISTORY: Past Surgical History:  Procedure Laterality Date  . CHOLECYSTECTOMY N/A 07/05/2016   Procedure: LAPAROSCOPIC CHOLECYSTECTOMY;  Surgeon: Georganna Skeans, MD;  Location: [place];  Service: General;  Laterality: N/A;  . colonscopy  05/20/09   per Dr. Deatra Ina, hyperplstic polyps, repeat in  10 yers   . TYMPANOSTOMY TUBE PLACEMENT      SOCIAL HISTORY: Social History   Tobacco Use  . Smoking status: Former Smoker    Last attempt to quit: 05/05/1991    Years since quitting: 26.6  . Smokeless tobacco: Never Used  Substance Use Topics  . Alcohol use: Yes    Alcohol/week: 1.2 oz    Types: 2 Standard drinks or equivalent per week    Comment: occ  . Drug use: No    FAMILY HISTORY: Family History  Problem Relation Age of Onset  . Arthritis Other   . Cancer Other        cervical  . Depression Other   . Diabetes Other   . Ulcers Other   . Breast cancer Maternal Grandmother   . Diabetes Mother   . Heart disease Mother   . Cancer Mother   . Depression Mother   . Alcoholism Mother   . Eating disorder Mother   . Diabetes Father   . Hypertension Father   . Depression Father   . Alcoholism Father     ROS: Review of Systems  Constitutional: Positive for malaise/fatigue. Negative for weight loss.  Gastrointestinal: Negative for nausea and vomiting.  Musculoskeletal:       Negative muscle weakness  Endo/Heme/Allergies:       Negative hypoglycemia  Psychiatric/Behavioral: Positive for depression. Negative for suicidal ideas.    PHYSICAL EXAM: Blood pressure 134/74, pulse 79, temperature 97.6 F (36.4 C), temperature source Oral, height 5\' 7"  (1.702 m), weight 180 lb (81.6 kg), last menstrual period 08/08/2012, SpO2 100 %. Body mass index is 28.19 kg/m. Physical Exam  Constitutional: She is oriented to person, place, and time. She appears well-developed and well-nourished.  Cardiovascular: Normal rate.  Pulmonary/Chest: Effort normal.  Musculoskeletal: Normal range of motion.  Neurological: She is oriented to person, place, and time.  Skin: Skin is warm and dry.  Psychiatric: She has a normal mood and affect. Her behavior is normal.  Vitals reviewed.   RECENT LABS AND TESTS: BMET    Component Value Date/Time   NA 143 10/23/2017 0826   K 4.8 10/23/2017  0826   CL 102 10/23/2017 0826   CO2 25 10/23/2017 0826   GLUCOSE 109 (H) 10/23/2017 0826   GLUCOSE 127 (H) 04/23/2017 0925   BUN 14 10/23/2017 0826   CREATININE 0.77 10/23/2017 0826   CALCIUM 9.6 10/23/2017 0826   GFRNONAA 85 10/23/2017 0826   GFRAA 98 10/23/2017 0826   Lab Results  Component Value Date   HGBA1C 7.0 (H) 10/23/2017   HGBA1C 7.0 (H) 07/18/2017   HGBA1C 7.6 (H) 04/23/2017   HGBA1C 7.5 (H) 01/11/2017   HGBA1C 7.8 (H) 10/02/2016   Lab Results  Component Value Date   INSULIN 11.5 10/23/2017   INSULIN 11.2 07/18/2017   CBC  Component Value Date/Time   WBC 5.5 07/18/2017 0815   WBC 6.2 04/23/2017 0925   RBC 4.60 07/18/2017 0815   RBC 4.78 04/23/2017 0925   HGB 13.1 07/18/2017 0815   HCT 40.1 07/18/2017 0815   PLT 250.0 04/23/2017 0925   MCV 87 07/18/2017 0815   MCH 28.5 07/18/2017 0815   MCH 28.2 06/27/2016 0906   MCHC 32.7 07/18/2017 0815   MCHC 32.8 04/23/2017 0925   RDW 13.4 07/18/2017 0815   LYMPHSABS 1.6 07/18/2017 0815   MONOABS 0.4 04/23/2017 0925   EOSABS 0.1 07/18/2017 0815   BASOSABS 0.0 07/18/2017 0815   Iron/TIBC/Ferritin/ %Sat No results found for: IRON, TIBC, FERRITIN, IRONPCTSAT Lipid Panel     Component Value Date/Time   CHOL 155 10/23/2017 0826   TRIG 144 10/23/2017 0826   HDL 53 10/23/2017 0826   CHOLHDL 4 04/23/2017 0925   VLDL 47.2 (H) 04/23/2017 0925   LDLCALC 73 10/23/2017 0826   LDLDIRECT 82.0 04/23/2017 0925   Hepatic Function Panel     Component Value Date/Time   PROT 7.1 10/23/2017 0826   ALBUMIN 4.6 10/23/2017 0826   AST 13 10/23/2017 0826   ALT 20 10/23/2017 0826   ALKPHOS 74 10/23/2017 0826   BILITOT 0.5 10/23/2017 0826   BILIDIR 0.1 04/23/2017 0925      Component Value Date/Time   TSH 2.130 07/18/2017 0815   TSH 1.15 04/23/2017 0925   TSH 1.77 03/21/2016 0732  Results for DEZYRAE, KENSINGER (MRN [id number]) as of 01/07/2018 13:47  Ref. Range 10/23/2017 08:26  Vitamin D, 25-Hydroxy Latest Ref Range:  30.0 - 100.0 ng/mL 51.3    ASSESSMENT AND PLAN: Type 2 diabetes mellitus without complication, without long-term current use of insulin (HCC) - Plan: liraglutide (VICTOZA) 18 MG/3ML SOPN  Other depression - with emotional eating - Plan: buPROPion (WELLBUTRIN SR) 150 MG 12 hr tablet  Vitamin D deficiency - Plan: Vitamin D, Ergocalciferol, (DRISDOL) 50000 units CAPS capsule  At risk for osteoporosis  Class 1 obesity with serious comorbidity and body mass index (BMI) of 30.0 to 30.9 in adult, unspecified obesity type - Starting BMI greater then 30  PLAN:  Diabetes II Tracy Anderson has been given extensive diabetes education by myself today including ideal fasting and post-prandial blood glucose readings, individual ideal Hgb A1c goals and hypoglycemia prevention. We discussed the importance of good blood sugar control to decrease the likelihood of diabetic complications such as nephropathy, neuropathy, limb loss, blindness, coronary artery disease, and death. We discussed the importance of intensive lifestyle modification including diet, exercise and weight loss as the first line treatment for diabetes. Altha agrees to continue Victoza 1.8 mg #3 pens and we will refill for 1 month and she agrees to continue [place] as prescribed. Zyiah agrees to follow up with our clinic in 2 weeks.  Vitamin D Deficiency Tracy Anderson was informed that low vitamin D levels contributes to fatigue and are associated with obesity, breast, and colon cancer. Tracy Anderson agrees to continue taking prescription Vit D @50 ,000 IU every week #4 and we will refill for 1 month. She will follow up for routine testing of vitamin D, at least 2-3 times per year. She was informed of the risk of over-replacement of vitamin D and agrees to not increase her dose unless she discusses this with [place] first. Tracy Anderson agrees to follow up with our clinic in 2 weeks and we will check labs at that time.  At risk for osteopenia and  osteoporosis Tracy Anderson is at risk for  osteopenia and osteoporsis due to her vitamin D deficiency. She was encouraged to take her vitamin D and follow her higher calcium diet and increase strengthening exercise to help strengthen her bones and decrease her risk of osteopenia and osteoporosis.  Depression with Emotional Eating Behaviors We discussed behavior modification techniques today to help Tracy Anderson deal with her emotional eating and depression. Sora agrees to continue taking Wellbutrin SR 150 mg BID #60 and we will refill for 1 month. Tracy Anderson agrees to follow up with our clinic in 2 weeks.  Obesity Tracy Anderson is currently in the action stage of change. As such, her goal is to continue with weight loss efforts She has agreed to keep a food journal with 1300 calories and 90+ grams of protein daily Tracy Anderson has been instructed to work up to a goal of 150 minutes of combined cardio and strengthening exercise per week for weight loss and overall health benefits. We discussed the following Behavioral Modification Strategies today: increasing lean protein intake, work on meal planning and easy cooking plans, ways to avoid boredom eating, and planning for success   Tracy Anderson has agreed to follow up with our clinic in 2 weeks. She was informed of the importance of frequent follow up visits to maximize her success with intensive lifestyle modifications for her multiple health conditions.   OBESITY BEHAVIORAL INTERVENTION VISIT  Today's visit was # 11 out of 22.  Starting weight: 184 lbs Starting date: 07/17/17 Today's weight : 180 lbs  Today's date: 01/07/2018 Total lbs lost to date: 4 (Patients must lose 7 lbs in the first 6 months to continue with counseling)   ASK: We discussed the diagnosis of obesity with Tracy Anderson today and Tracy Anderson agreed to give [place] permission to discuss obesity behavioral modification therapy today.  ASSESS: Tracy Anderson has the diagnosis of obesity and her  BMI today is 28.19 Tracy Anderson is in the action stage of change   ADVISE: Tracy Anderson was educated on the multiple health risks of obesity as well as the benefit of weight loss to improve her health. She was advised of the need for long term treatment and the importance of lifestyle modifications.  AGREE: Multiple dietary modification options and treatment options were discussed and  Tracy Anderson agreed to the above obesity treatment plan.  I, Tracy Anderson, am acting as transcriptionist for Dennard Nip, MD  I have reviewed the above documentation for accuracy and completeness, and I agree with the above. -Dennard Nip, MD

## 2018-01-08 MED FILL — VICTOZA 18 MG/3 ML INJECT P: 18 | 30 days supply | Qty: 9 | Fill #0

## 2018-01-12 MED FILL — LOSARTAN POTASSIUM 50 MG TA: 50 | 90 days supply | Qty: 90 | Fill #2

## 2018-01-21 ENCOUNTER — Other Ambulatory Visit (INDEPENDENT_AMBULATORY_CARE_PROVIDER_SITE_OTHER): Payer: Self-pay | Admitting: Family Medicine

## 2018-01-21 DIAGNOSIS — E119 Type 2 diabetes mellitus without complications: Secondary | ICD-10-CM

## 2018-01-21 MED ORDER — FARXIGA 10 MG PO TABS: 10.0000 mg | ORAL_TABLET | Freq: Every day | ORAL | 0 refills | Status: DC

## 2018-01-21 MED FILL — FARXIGA 10 MG TABLET: 10 | 30 days supply | Qty: 30 | Fill #0

## 2018-01-25 ENCOUNTER — Other Ambulatory Visit: Payer: Self-pay | Admitting: Physician Assistant

## 2018-01-25 DIAGNOSIS — D229 Melanocytic nevi, unspecified: Secondary | ICD-10-CM | POA: Diagnosis not present

## 2018-01-25 DIAGNOSIS — D485 Neoplasm of uncertain behavior of skin: Secondary | ICD-10-CM | POA: Diagnosis not present

## 2018-01-25 DIAGNOSIS — L821 Other seborrheic keratosis: Secondary | ICD-10-CM | POA: Diagnosis not present

## 2018-01-25 HISTORY — DX: Melanocytic nevi, unspecified: D22.9

## 2018-01-25 MED FILL — KETOCONAZOLE 2% SHAMPOO: 2 | 30 days supply | Qty: 120 | Fill #0

## 2018-01-28 ENCOUNTER — Ambulatory Visit (INDEPENDENT_AMBULATORY_CARE_PROVIDER_SITE_OTHER): Payer: 59 | Admitting: Family Medicine

## 2018-01-29 ENCOUNTER — Other Ambulatory Visit: Payer: Self-pay | Admitting: Family Medicine

## 2018-01-29 DIAGNOSIS — Z1231 Encounter for screening mammogram for malignant neoplasm of breast: Secondary | ICD-10-CM

## 2018-01-30 ENCOUNTER — Encounter (INDEPENDENT_AMBULATORY_CARE_PROVIDER_SITE_OTHER): Payer: Self-pay

## 2018-01-30 ENCOUNTER — Ambulatory Visit (INDEPENDENT_AMBULATORY_CARE_PROVIDER_SITE_OTHER): Payer: 59 | Admitting: Physician Assistant

## 2018-02-06 ENCOUNTER — Ambulatory Visit (INDEPENDENT_AMBULATORY_CARE_PROVIDER_SITE_OTHER): Payer: 59 | Admitting: Physician Assistant

## 2018-02-06 VITALS — BP 129/81 | HR 73 | Temp 97.6°F | Ht 67.0 in | Wt 182.0 lb

## 2018-02-06 DIAGNOSIS — E559 Vitamin D deficiency, unspecified: Secondary | ICD-10-CM

## 2018-02-06 DIAGNOSIS — E669 Obesity, unspecified: Secondary | ICD-10-CM | POA: Diagnosis not present

## 2018-02-06 DIAGNOSIS — Z9189 Other specified personal risk factors, not elsewhere classified: Secondary | ICD-10-CM | POA: Diagnosis not present

## 2018-02-06 DIAGNOSIS — E119 Type 2 diabetes mellitus without complications: Secondary | ICD-10-CM | POA: Diagnosis not present

## 2018-02-06 DIAGNOSIS — Z6830 Body mass index (BMI) 30.0-30.9, adult: Secondary | ICD-10-CM

## 2018-02-06 MED ORDER — INSULIN PEN NEEDLE 32G X 4 MM MISC: 1.0000 | Freq: Every day | 0 refills | Status: DC

## 2018-02-06 MED ORDER — VITAMIN D (ERGOCALCIFEROL) 1.25 MG (50000 UNIT) PO CAPS: 50000.0000 [IU] | ORAL_CAPSULE | ORAL | 0 refills | Status: DC

## 2018-02-06 MED ORDER — GLUCOSE BLOOD VI STRP
ORAL_STRIP | 0 refills | Status: DC
Start: 2018-02-06 — End: 2018-04-26

## 2018-02-06 MED ORDER — LIRAGLUTIDE 18 MG/3ML ~~LOC~~ SOPN: 1.8000 mg | PEN_INJECTOR | Freq: Every morning | SUBCUTANEOUS | 0 refills | Status: DC

## 2018-02-06 MED ORDER — FARXIGA 10 MG PO TABS: 10.0000 mg | ORAL_TABLET | Freq: Every day | ORAL | 0 refills | Status: DC

## 2018-02-06 MED FILL — ACCU-CHEK GUIDE STRP: 50 days supply | Qty: 100 | Fill #0

## 2018-02-06 MED FILL — UNIFINE PENTIPS 32GX5/32: 32G X 4 MM | 90 days supply | Qty: 100 | Fill #0

## 2018-02-06 MED FILL — VIT D2 1.25 MG (50,000 UNIT: 1.25 MG | 28 days supply | Qty: 4 | Fill #0

## 2018-02-06 MED FILL — VICTOZA 18 MG/3 ML INJECT P: 18 | 30 days supply | Qty: 9 | Fill #0

## 2018-02-06 MED FILL — UNIFINE PENTIPS 32GX5/32": 32G X 4 MM | 90 days supply | Qty: 100 | Fill #0

## 2018-02-06 NOTE — Progress Notes (Signed)
Office: [phone]  /  Fax: [phone]   HPI:   Chief Complaint: OBESITY Tracy Anderson is here to discuss her progress with her obesity treatment plan. She is on the keep a food journal with 1300 calories and 90+ grams of protein daily and is following her eating plan approximately 60 % of the time. She states she is exercising 0 minutes 0 times per week. Tracy Anderson is mindful of her eating and controls her portions. However, she continues to have challenges getting the recommended protein.  Her weight is 182 lb (82.6 kg) today and has gained 2 pounds since her last visit. She has lost 2 lbs since starting treatment with [place].  Diabetes II Tracy Anderson has a diagnosis of diabetes type II. Tracy Anderson states fasting BGs average in 130's. She denies hypoglycemia. She is on [place]. Last A1c was 7.0 on 10/23/17. She has been working on intensive lifestyle modifications including diet, exercise, and weight loss to help control her blood glucose levels.  Vitamin D Deficiency Tracy Anderson has a diagnosis of vitamin D deficiency. She is currently taking prescription Vit D and denies nausea, vomiting or muscle weakness.  At risk for osteopenia and osteoporosis Tracy Anderson is at higher risk of osteopenia and osteoporosis due to vitamin D deficiency.   ALLERGIES: Allergies  Allergen Reactions  . Lisinopril Cough  . Shrimp [Shellfish Allergy] Diarrhea    Severe stomach cramps and diarrhea  . Clarithromycin Other (See Comments)    UNSPECIFIED     MEDICATIONS: Current Outpatient Medications on File Prior to Visit  Medication Sig Dispense Refill  . buPROPion (WELLBUTRIN SR) 150 MG 12 hr tablet Take 1 tablet (150 mg total) by mouth 2 (two) times daily. 60 tablet 0  . Lancets (ACCU-CHEK SOFT TOUCH) lancets Use 2 X a day and diagnosis code is E 11.9 100 each 0  . losartan (COZAAR) 50 MG tablet Take 1 tablet (50 mg total) by mouth daily. 30 tablet 0  . metFORMIN (GLUCOPHAGE) 500 MG tablet Take 500mg  in  the am by mouth and 1000mg  in the pm daily 60 tablet 0   No current facility-administered medications on file prior to visit.     PAST MEDICAL HISTORY: Past Medical History:  Diagnosis Date  . Arthritis   . Asthma    as a child  . Back pain   . Depression   . Diabetes mellitus type II   . Herpes genitalia   . Hyperlipidemia   . Hypertension   . Shellfish allergy   . Shingles 2012   right flank    PAST SURGICAL HISTORY: Past Surgical History:  Procedure Laterality Date  . CHOLECYSTECTOMY N/A 07/05/2016   Procedure: LAPAROSCOPIC CHOLECYSTECTOMY;  Surgeon: Tracy Skeans, MD;  Location: [place];  Service: General;  Laterality: N/A;  . colonscopy  05/20/09   per Dr. Deatra Ina, hyperplstic polyps, repeat in 10 yers   . TYMPANOSTOMY TUBE PLACEMENT      SOCIAL HISTORY: Social History   Tobacco Use  . Smoking status: Former Smoker    Last attempt to quit: 05/05/1991    Years since quitting: 26.7  . Smokeless tobacco: Never Used  Substance Use Topics  . Alcohol use: Yes    Alcohol/week: 1.2 oz    Types: 2 Standard drinks or equivalent per week    Comment: occ  . Drug use: No    FAMILY HISTORY: Family History  Problem Relation Age of Onset  . Arthritis Other   . Cancer Other  cervical  . Depression Other   . Diabetes Other   . Ulcers Other   . Breast cancer Maternal Grandmother   . Diabetes Mother   . Heart disease Mother   . Cancer Mother   . Depression Mother   . Alcoholism Mother   . Eating disorder Mother   . Diabetes Father   . Hypertension Father   . Depression Father   . Alcoholism Father     ROS: Review of Systems  Constitutional: Negative for weight loss.  Gastrointestinal: Negative for nausea and vomiting.  Musculoskeletal:       Negative muscle weakness  Endo/Heme/Allergies:       Negative hypoglycemia    PHYSICAL EXAM: Blood pressure 129/81, pulse 73, temperature 97.6 F (36.4 C), temperature source Oral, height 5\' 7"  (1.702 m), weight  182 lb (82.6 kg), last menstrual period 08/08/2012, SpO2 100 %. Body mass index is 28.51 kg/m. Physical Exam  Constitutional: She is oriented to person, place, and time. She appears well-developed and well-nourished.  Cardiovascular: Normal rate.  Pulmonary/Chest: Effort normal.  Musculoskeletal: Normal range of motion.  Neurological: She is oriented to person, place, and time.  Skin: Skin is warm and dry.  Psychiatric: She has a normal mood and affect. Her behavior is normal.  Vitals reviewed.   RECENT LABS AND TESTS: BMET    Component Value Date/Time   NA 143 10/23/2017 0826   K 4.8 10/23/2017 0826   CL 102 10/23/2017 0826   CO2 25 10/23/2017 0826   GLUCOSE 109 (H) 10/23/2017 0826   GLUCOSE 127 (H) 04/23/2017 0925   BUN 14 10/23/2017 0826   CREATININE 0.77 10/23/2017 0826   CALCIUM 9.6 10/23/2017 0826   GFRNONAA 85 10/23/2017 0826   GFRAA 98 10/23/2017 0826   Lab Results  Component Value Date   HGBA1C 7.0 (H) 10/23/2017   HGBA1C 7.0 (H) 07/18/2017   HGBA1C 7.6 (H) 04/23/2017   HGBA1C 7.5 (H) 01/11/2017   HGBA1C 7.8 (H) 10/02/2016   Lab Results  Component Value Date   INSULIN 11.5 10/23/2017   INSULIN 11.2 07/18/2017   CBC    Component Value Date/Time   WBC 5.5 07/18/2017 0815   WBC 6.2 04/23/2017 0925   RBC 4.60 07/18/2017 0815   RBC 4.78 04/23/2017 0925   HGB 13.1 07/18/2017 0815   HCT 40.1 07/18/2017 0815   PLT 250.0 04/23/2017 0925   MCV 87 07/18/2017 0815   MCH 28.5 07/18/2017 0815   MCH 28.2 06/27/2016 0906   MCHC 32.7 07/18/2017 0815   MCHC 32.8 04/23/2017 0925   RDW 13.4 07/18/2017 0815   LYMPHSABS 1.6 07/18/2017 0815   MONOABS 0.4 04/23/2017 0925   EOSABS 0.1 07/18/2017 0815   BASOSABS 0.0 07/18/2017 0815   Iron/TIBC/Ferritin/ %Sat No results found for: IRON, TIBC, FERRITIN, IRONPCTSAT Lipid Panel     Component Value Date/Time   CHOL 155 10/23/2017 0826   TRIG 144 10/23/2017 0826   HDL 53 10/23/2017 0826   CHOLHDL 4 04/23/2017 0925    VLDL 47.2 (H) 04/23/2017 0925   LDLCALC 73 10/23/2017 0826   LDLDIRECT 82.0 04/23/2017 0925   Hepatic Function Panel     Component Value Date/Time   PROT 7.1 10/23/2017 0826   ALBUMIN 4.6 10/23/2017 0826   AST 13 10/23/2017 0826   ALT 20 10/23/2017 0826   ALKPHOS 74 10/23/2017 0826   BILITOT 0.5 10/23/2017 0826   BILIDIR 0.1 04/23/2017 0925      Component Value Date/Time   TSH  2.130 07/18/2017 0815   TSH 1.15 04/23/2017 0925   TSH 1.77 03/21/2016 0732  Results for SAYRE, MAZOR (MRN [id number]) as of 02/06/2018 12:26  Ref. Range 10/23/2017 08:26  Vitamin D, 25-Hydroxy Latest Ref Range: 30.0 - 100.0 ng/mL 51.3    ASSESSMENT AND PLAN: Type 2 diabetes mellitus without complication, without long-term current use of insulin (HCC) - Plan: FARXIGA 10 MG TABS tablet, liraglutide (VICTOZA) 18 MG/3ML SOPN, Insulin Pen Needle 32G X 4 MM MISC, glucose blood (ACCU-CHEK GUIDE) test strip  Vitamin D deficiency - Plan: Vitamin D, Ergocalciferol, (DRISDOL) 50000 units CAPS capsule  At risk for osteoporosis  Class 1 obesity with serious comorbidity and body mass index (BMI) of 30.0 to 30.9 in adult, unspecified obesity type - Starting BMI greater then 30  PLAN:  Diabetes II Tracy Anderson has been given extensive diabetes education by myself today including ideal fasting and post-prandial blood glucose readings, individual ideal Hgb A1c goals and hypoglycemia prevention. We discussed the importance of good blood sugar control to decrease the likelihood of diabetic complications such as nephropathy, neuropathy, limb loss, blindness, coronary artery disease, and death. We discussed the importance of intensive lifestyle modification including diet, exercise and weight loss as the first line treatment for diabetes. Tracy Anderson agrees to continue [place] 10 mg qd #30 and we will refill for 1 month, she agrees to continue Victoza 3 pen (Pt. @ 1.8 mg) and we will refill for 1 month, and we will refill pen  needles and glucose test strips for 1 month. Tracy Anderson agrees to follow up with our clinic in 3 weeks.  Vitamin D Deficiency Tracy Anderson was informed that low vitamin D levels contributes to fatigue and are associated with obesity, breast, and colon cancer. Deserae agrees to continue taking prescription Vit D @50 ,000 IU every week #4 and we will refill for 1 month. She will follow up for routine testing of vitamin D, at least 2-3 times per year. She was informed of the risk of over-replacement of vitamin D and agrees to not increase her dose unless she discusses this with [place] first. Tracy Anderson agrees to follow up with our clinic in 3 weeks.   At risk for osteopenia and osteoporosis Tracy Anderson is at risk for osteopenia and osteoporsis due to her vitamin D deficiency. She was encouraged to take her vitamin D and follow her higher calcium diet and increase strengthening exercise to help strengthen her bones and decrease her risk of osteopenia and osteoporosis.  Obesity Tracy Anderson is currently in the action stage of change. As such, her goal is to continue with weight loss efforts She has agreed to portion control better and make smarter food choices, such as increase vegetables and decrease simple carbohydrates  Tracy Anderson has been instructed to work up to a goal of 150 minutes of combined cardio and strengthening exercise per week for weight loss and overall health benefits. We discussed the following Behavioral Modification Strategies today: increasing lean protein intake and work on meal planning and easy cooking plans   Tracy Anderson has agreed to follow up with our clinic in 3 weeks. She was informed of the importance of frequent follow up visits to maximize her success with intensive lifestyle modifications for her multiple health conditions.   OBESITY BEHAVIORAL INTERVENTION VISIT  Today's visit was # 12 out of 22.  Starting weight: 184 lbs Starting date: 07/17/17 Today's weight : 182 lbs Today's date:  02/06/2018 Total lbs lost to date: 2 (Patients must lose 7 lbs in the first 6  months to continue with counseling)   ASK: We discussed the diagnosis of obesity with Tracy Anderson today and Tracy Anderson agreed to give [place] permission to discuss obesity behavioral modification therapy today.  ASSESS: Tracy Anderson has the diagnosis of obesity and her BMI today is 28.5 Tracy Anderson is in the action stage of change   ADVISE: Tracy Anderson was educated on the multiple health risks of obesity as well as the benefit of weight loss to improve her health. She was advised of the need for long term treatment and the importance of lifestyle modifications.  AGREE: Multiple dietary modification options and treatment options were discussed and  Tracy Anderson agreed to the above obesity treatment plan.   Wilhemena Durie, am acting as transcriptionist for Lacy Duverney, PA-C I, Lacy Duverney [facility], have reviewed this note and agree with its content

## 2018-02-12 MED FILL — BUPROPION SR 150 MG TABLET: 150 | 30 days supply | Qty: 60 | Fill #0

## 2018-02-20 ENCOUNTER — Other Ambulatory Visit (INDEPENDENT_AMBULATORY_CARE_PROVIDER_SITE_OTHER): Payer: Self-pay | Admitting: Physician Assistant

## 2018-02-20 DIAGNOSIS — E119 Type 2 diabetes mellitus without complications: Secondary | ICD-10-CM

## 2018-02-20 MED ORDER — FARXIGA 10 MG PO TABS: 10.0000 mg | ORAL_TABLET | Freq: Every day | ORAL | 0 refills | Status: DC

## 2018-02-20 MED FILL — FARXIGA 10 MG TABLET: 10 | 30 days supply | Qty: 30 | Fill #0

## 2018-02-26 ENCOUNTER — Ambulatory Visit (INDEPENDENT_AMBULATORY_CARE_PROVIDER_SITE_OTHER): Payer: 59 | Admitting: Physician Assistant

## 2018-02-26 VITALS — BP 126/75 | HR 74 | Temp 97.7°F | Ht 67.0 in | Wt 180.0 lb

## 2018-02-26 DIAGNOSIS — Z6830 Body mass index (BMI) 30.0-30.9, adult: Secondary | ICD-10-CM | POA: Diagnosis not present

## 2018-02-26 DIAGNOSIS — E559 Vitamin D deficiency, unspecified: Secondary | ICD-10-CM

## 2018-02-26 DIAGNOSIS — E669 Obesity, unspecified: Secondary | ICD-10-CM | POA: Diagnosis not present

## 2018-02-26 DIAGNOSIS — Z9189 Other specified personal risk factors, not elsewhere classified: Secondary | ICD-10-CM | POA: Diagnosis not present

## 2018-02-26 DIAGNOSIS — E119 Type 2 diabetes mellitus without complications: Secondary | ICD-10-CM | POA: Diagnosis not present

## 2018-02-27 MED ORDER — VITAMIN D (ERGOCALCIFEROL) 1.25 MG (50000 UNIT) PO CAPS: 50000.0000 [IU] | ORAL_CAPSULE | ORAL | 0 refills | Status: DC

## 2018-02-27 NOTE — Progress Notes (Signed)
Office: [phone]  /  Fax: [phone]   HPI:   Chief Complaint: OBESITY Tracy Anderson is here to discuss her progress with her obesity treatment plan. She is on the portion control better and make smarter food choices plan and is following her eating plan approximately 100 % of the time. She states she is exercising 0 minutes 0 times per week. [name] continues to do well with weight loss. She will be going out of town for vacation and would like vacation eating strategies. Her weight is 180 lb (81.6 kg) today and has had a weight loss of 2 pounds over a period of 3 weeks since her last visit. She has lost 4 lbs since starting treatment with [place].  Vitamin D deficiency Tracy Anderson has a diagnosis of vitamin D deficiency. She is currently taking vit D and denies nausea, vomiting or muscle weakness.  At risk for osteopenia and osteoporosis Tracy Anderson is at higher risk of osteopenia and osteoporosis due to vitamin D deficiency.   Diabetes II Tracy Anderson has a diagnosis of diabetes type II. [name] states fasting BGs range between 110 and 140's. She is not checking post prandial BGs. [name] denies any hypoglycemic episodes. Tracy Anderson is on victoza, farxiga and metformin currently. She has been working on intensive lifestyle modifications including diet, exercise, and weight loss to help control her blood glucose levels.  ALLERGIES: Allergies  Allergen Reactions  . Lisinopril Cough  . Shrimp [Shellfish Allergy] Diarrhea    Severe stomach cramps and diarrhea  . Clarithromycin Other (See Comments)    UNSPECIFIED     MEDICATIONS: Current Outpatient Medications on File Prior to Visit  Medication Sig Dispense Refill  . buPROPion (WELLBUTRIN SR) 150 MG 12 hr tablet Take 1 tablet (150 mg total) by mouth 2 (two) times daily. 60 tablet 0  . FARXIGA 10 MG TABS tablet Take 10 mg by mouth daily. 30 tablet 0  . glucose blood (ACCU-CHEK GUIDE) test strip Use 2 X a day and diagnosis code is E 11.9 100  each 0  . Insulin Pen Needle 32G X 4 MM MISC 1 Package by Does not apply route daily at 12 noon. 100 each 0  . Lancets (ACCU-CHEK SOFT TOUCH) lancets Use 2 X a day and diagnosis code is E 11.9 100 each 0  . liraglutide (VICTOZA) 18 MG/3ML SOPN Inject 0.3 mLs (1.8 mg total) into the skin every morning. 3 pen 0  . losartan (COZAAR) 50 MG tablet Take 1 tablet (50 mg total) by mouth daily. 30 tablet 0  . metFORMIN (GLUCOPHAGE) 500 MG tablet Take 500mg  in the am by mouth and 1000mg  in the pm daily 60 tablet 0  . Vitamin D, Ergocalciferol, (DRISDOL) 50000 units CAPS capsule Take 1 capsule (50,000 Units total) by mouth every 7 (seven) days. 4 capsule 0   No current facility-administered medications on file prior to visit.     PAST MEDICAL HISTORY: Past Medical History:  Diagnosis Date  . Arthritis   . Asthma    as a child  . Back pain   . Depression   . Diabetes mellitus type II   . Herpes genitalia   . Hyperlipidemia   . Hypertension   . Shellfish allergy   . Shingles 2012   right flank    PAST SURGICAL HISTORY: Past Surgical History:  Procedure Laterality Date  . CHOLECYSTECTOMY N/A 07/05/2016   Procedure: LAPAROSCOPIC CHOLECYSTECTOMY;  Surgeon: Georganna Skeans, MD;  Location: [facility];  Service: General;  Laterality: N/A;  .  colonscopy  05/20/09   per Dr. Deatra Ina, hyperplstic polyps, repeat in 10 yers   . TYMPANOSTOMY TUBE PLACEMENT      SOCIAL HISTORY: Social History   Tobacco Use  . Smoking status: Former Smoker    Last attempt to quit: 05/05/1991    Years since quitting: 26.8  . Smokeless tobacco: Never Used  Substance Use Topics  . Alcohol use: Yes    Alcohol/week: 1.2 oz    Types: 2 Standard drinks or equivalent per week    Comment: occ  . Drug use: No    FAMILY HISTORY: Family History  Problem Relation Age of Onset  . Arthritis Other   . Cancer Other        cervical  . Depression Other   . Diabetes Other   . Ulcers Other   . Breast cancer Maternal Grandmother    . Diabetes Mother   . Heart disease Mother   . Cancer Mother   . Depression Mother   . Alcoholism Mother   . Eating disorder Mother   . Diabetes Father   . Hypertension Father   . Depression Father   . Alcoholism Father     ROS: Review of Systems  Constitutional: Positive for weight loss.  Gastrointestinal: Negative for nausea and vomiting.  Musculoskeletal:       Negative for muscle weakness  Endo/Heme/Allergies:       Negative for hypoglycemia    PHYSICAL EXAM: Blood pressure 126/75, pulse 74, temperature 97.7 F (36.5 C), temperature source Oral, height 5\' 7"  (1.702 m), weight 180 lb (81.6 kg), last menstrual period 08/08/2012, SpO2 99 %. Body mass index is 28.19 kg/m. Physical Exam  Constitutional: She is oriented to person, place, and time. She appears well-developed and well-nourished.  Cardiovascular: Normal rate.  Pulmonary/Chest: Effort normal.  Musculoskeletal: Normal range of motion.  Neurological: She is oriented to person, place, and time.  Skin: Skin is warm and dry.  Psychiatric: She has a normal mood and affect. Her behavior is normal.  Vitals reviewed.   RECENT LABS AND TESTS: BMET    Component Value Date/Time   NA 143 10/23/2017 0826   K 4.8 10/23/2017 0826   CL 102 10/23/2017 0826   CO2 25 10/23/2017 0826   GLUCOSE 109 (H) 10/23/2017 0826   GLUCOSE 127 (H) 04/23/2017 0925   BUN 14 10/23/2017 0826   CREATININE 0.77 10/23/2017 0826   CALCIUM 9.6 10/23/2017 0826   GFRNONAA 85 10/23/2017 0826   GFRAA 98 10/23/2017 0826   Lab Results  Component Value Date   HGBA1C 7.0 (H) 10/23/2017   HGBA1C 7.0 (H) 07/18/2017   HGBA1C 7.6 (H) 04/23/2017   HGBA1C 7.5 (H) 01/11/2017   HGBA1C 7.8 (H) 10/02/2016   Lab Results  Component Value Date   INSULIN 11.5 10/23/2017   INSULIN 11.2 07/18/2017   CBC    Component Value Date/Time   WBC 5.5 07/18/2017 0815   WBC 6.2 04/23/2017 0925   RBC 4.60 07/18/2017 0815   RBC 4.78 04/23/2017 0925   HGB 13.1  07/18/2017 0815   HCT 40.1 07/18/2017 0815   PLT 250.0 04/23/2017 0925   MCV 87 07/18/2017 0815   MCH 28.5 07/18/2017 0815   MCH 28.2 06/27/2016 0906   MCHC 32.7 07/18/2017 0815   MCHC 32.8 04/23/2017 0925   RDW 13.4 07/18/2017 0815   LYMPHSABS 1.6 07/18/2017 0815   MONOABS 0.4 04/23/2017 0925   EOSABS 0.1 07/18/2017 0815   BASOSABS 0.0 07/18/2017 0815  Iron/TIBC/Ferritin/ %Sat No results found for: IRON, TIBC, FERRITIN, IRONPCTSAT Lipid Panel     Component Value Date/Time   CHOL 155 10/23/2017 0826   TRIG 144 10/23/2017 0826   HDL 53 10/23/2017 0826   CHOLHDL 4 04/23/2017 0925   VLDL 47.2 (H) 04/23/2017 0925   LDLCALC 73 10/23/2017 0826   LDLDIRECT 82.0 04/23/2017 0925   Hepatic Function Panel     Component Value Date/Time   PROT 7.1 10/23/2017 0826   ALBUMIN 4.6 10/23/2017 0826   AST 13 10/23/2017 0826   ALT 20 10/23/2017 0826   ALKPHOS 74 10/23/2017 0826   BILITOT 0.5 10/23/2017 0826   BILIDIR 0.1 04/23/2017 0925      Component Value Date/Time   TSH 2.130 07/18/2017 0815   TSH 1.15 04/23/2017 0925   TSH 1.77 03/21/2016 0732   Results for NELANI, SCHMELZLE (MRN [id number]) as of 02/27/2018 08:49  Ref. Range 10/23/2017 08:26  Vitamin D, 25-Hydroxy Latest Ref Range: 30.0 - 100.0 ng/mL 51.3   ASSESSMENT AND PLAN: Vitamin D deficiency - Plan: Vitamin D, Ergocalciferol, (DRISDOL) 50000 units CAPS capsule  Type 2 diabetes mellitus without complication, without long-term current use of insulin (HCC)  At risk for osteoporosis  Class 1 obesity with serious comorbidity and body mass index (BMI) of 30.0 to 30.9 in adult, unspecified obesity type - BMI greater than 30 at start of program   PLAN:  Vitamin D Deficiency Mahari was informed that low vitamin D levels contributes to fatigue and are associated with obesity, breast, and colon cancer. She agrees to continue to take prescription Vit D @50 ,000 IU every week #4 with no refills and will follow up for routine  testing of vitamin D, at least 2-3 times per year. She was informed of the risk of over-replacement of vitamin D and agrees to not increase her dose unless she discusses this with [place] first. Katharyn agrees to follow up with our clinic in 3 weeks.  At risk for osteopenia and osteoporosis Nedda is at risk for osteopenia and osteoporosis due to her vitamin D deficiency. She was encouraged to take her vitamin D and follow her higher calcium diet and increase strengthening exercise to help strengthen her bones and decrease her risk of osteopenia and osteoporosis.  Diabetes II Jadee has been given extensive diabetes education by myself today including ideal fasting and post-prandial blood glucose readings, individual ideal Hgb A1c goals and hypoglycemia prevention. We discussed the importance of good blood sugar control to decrease the likelihood of diabetic complications such as nephropathy, neuropathy, limb loss, blindness, coronary artery disease, and death. We discussed the importance of intensive lifestyle modification including diet, exercise and weight loss as the first line treatment for diabetes. Osa agrees to continue her diabetes medications and will follow up at the agreed upon time.  Obesity Sareena is currently in the action stage of change. As such, her goal is to maintain weight for now She has agreed to portion control better and make smarter food choices, such as increase vegetables and decrease simple carbohydrates  Aubriana has been instructed to work up to a goal of 150 minutes of combined cardio and strengthening exercise per week for weight loss and overall health benefits. We discussed the following Behavioral Modification Strategies today: travel eating strategies and planning for success  Corianna has agreed to follow up with our clinic in 3 weeks. She was informed of the importance of frequent follow up visits to maximize her success with intensive lifestyle  modifications  for her multiple health conditions.   OBESITY BEHAVIORAL INTERVENTION VISIT  Today's visit was # 13 out of 22.  Starting weight: 184 lbs Starting date: 07/17/17 Today's weight : 180 lbs  Today's date: 02/26/2018 Total lbs lost to date: 4 (Patients must lose 7 lbs in the first 6 months to continue with counseling)   ASK: We discussed the diagnosis of obesity with Yolonda Kida today and Joelene Millin agreed to give [place] permission to discuss obesity behavioral modification therapy today.  ASSESS: Trinidee has the diagnosis of obesity and her BMI today is 28.19 Lillyauna is in the action stage of change   ADVISE: Kween was educated on the multiple health risks of obesity as well as the benefit of weight loss to improve her health. She was advised of the need for long term treatment and the importance of lifestyle modifications.  AGREE: Multiple dietary modification options and treatment options were discussed and  Ladina agreed to the above obesity treatment plan.   Corey Skains, am acting as transcriptionist for [employer], PA-C I, Lacy Duverney [facility], have reviewed this note and agree with its content

## 2018-03-06 ENCOUNTER — Telehealth: Payer: Self-pay | Admitting: Family Medicine

## 2018-03-06 NOTE — Telephone Encounter (Signed)
Copied from [place] [id number]. Topic: Inquiry >> Mar 06, 2018 12:00 PM Oliver Pila B wrote: Reason for CRM: pt called to ask for her pcp to fill out a form that she is faxing over to the practice an authorization form of medical records that has [employer] on it, please fill out form and fax back to [employer], contact pt if needed    There was a form scanned in the chart on 03/07/2018, was signed back in April 20219.

## 2018-03-12 NOTE — Telephone Encounter (Signed)
I have NOT yet received this form. Called pt and left a VM to call back.

## 2018-03-13 ENCOUNTER — Other Ambulatory Visit (INDEPENDENT_AMBULATORY_CARE_PROVIDER_SITE_OTHER): Payer: Self-pay | Admitting: Physician Assistant

## 2018-03-13 ENCOUNTER — Other Ambulatory Visit (INDEPENDENT_AMBULATORY_CARE_PROVIDER_SITE_OTHER): Payer: Self-pay | Admitting: Family Medicine

## 2018-03-13 DIAGNOSIS — E559 Vitamin D deficiency, unspecified: Secondary | ICD-10-CM

## 2018-03-13 MED ORDER — VITAMIN D (ERGOCALCIFEROL) 1.25 MG (50000 UNIT) PO CAPS: 50000.0000 [IU] | ORAL_CAPSULE | ORAL | 0 refills | Status: DC

## 2018-03-13 MED ORDER — METFORMIN HCL 500 MG PO TABS: ORAL_TABLET | ORAL | 0 refills | Status: DC

## 2018-03-13 MED FILL — metFORMIN HCL 500 MG TABS: 500 | 20 days supply | Qty: 60 | Fill #0

## 2018-03-13 MED FILL — VIT D2 1.25 MG (50,000 UNIT: 1.25 MG | 28 days supply | Qty: 4 | Fill #0

## 2018-03-14 ENCOUNTER — Telehealth: Payer: Self-pay | Admitting: Family Medicine

## 2018-03-14 NOTE — Telephone Encounter (Signed)
Sent to PCP to advise 

## 2018-03-14 NOTE — Telephone Encounter (Signed)
Copied from [place] [id number]. Topic: Quick Communication - See Telephone Encounter >> Mar 14, 2018  9:36 AM Ether Griffins B wrote: CRM for notification. See Telephone encounter for: 03/14/18.  Pt has mammo scheduled for tomorrow [facility]/18 at the [facility] and is requesting an order be placed today for it.

## 2018-03-15 ENCOUNTER — Ambulatory Visit
Admission: RE | Admit: 2018-03-15 | Discharge: 2018-03-15 | Disposition: A | Discharge status: Home / Self Care | Payer: 59 | Source: Ambulatory Visit | Attending: Family Medicine | Admitting: Family Medicine

## 2018-03-15 DIAGNOSIS — Z1231 Encounter for screening mammogram for malignant neoplasm of breast: Secondary | ICD-10-CM | POA: Diagnosis not present

## 2018-03-18 NOTE — Telephone Encounter (Signed)
Done

## 2018-03-19 ENCOUNTER — Ambulatory Visit (INDEPENDENT_AMBULATORY_CARE_PROVIDER_SITE_OTHER): Payer: 59 | Admitting: Physician Assistant

## 2018-03-19 VITALS — BP 128/81 | HR 78 | Temp 97.6°F | Ht 67.0 in | Wt 181.0 lb

## 2018-03-19 DIAGNOSIS — E119 Type 2 diabetes mellitus without complications: Secondary | ICD-10-CM | POA: Diagnosis not present

## 2018-03-19 DIAGNOSIS — F3289 Other specified depressive episodes: Secondary | ICD-10-CM | POA: Diagnosis not present

## 2018-03-19 DIAGNOSIS — E669 Obesity, unspecified: Secondary | ICD-10-CM

## 2018-03-19 DIAGNOSIS — Z6830 Body mass index (BMI) 30.0-30.9, adult: Secondary | ICD-10-CM

## 2018-03-19 DIAGNOSIS — Z9189 Other specified personal risk factors, not elsewhere classified: Secondary | ICD-10-CM

## 2018-03-19 MED ORDER — LIRAGLUTIDE 18 MG/3ML ~~LOC~~ SOPN: 1.8000 mg | PEN_INJECTOR | Freq: Every morning | SUBCUTANEOUS | 0 refills | Status: DC

## 2018-03-19 MED ORDER — BUPROPION HCL ER (SR) 150 MG PO TB12: 150.0000 mg | ORAL_TABLET | Freq: Two times a day (BID) | ORAL | 0 refills | Status: DC

## 2018-03-19 MED ORDER — FARXIGA 10 MG PO TABS: 10.0000 mg | ORAL_TABLET | Freq: Every day | ORAL | 0 refills | Status: DC

## 2018-03-19 MED ORDER — ACCU-CHEK SOFT TOUCH LANCETS MISC: 0 refills | Status: DC

## 2018-03-19 MED FILL — VICTOZA 18 MG/3 ML INJECT P: 18 | 30 days supply | Qty: 9 | Fill #0

## 2018-03-19 MED FILL — ACCU-CHEK FASTCLIX LANCETS: 50 days supply | Qty: 102 | Fill #0

## 2018-03-19 MED FILL — BUPROPION SR 150 MG TABLET: 150 | 30 days supply | Qty: 60 | Fill #0

## 2018-03-19 MED FILL — FARXIGA 10 MG TABLET: 10 | 30 days supply | Qty: 30 | Fill #0

## 2018-03-19 NOTE — Progress Notes (Signed)
Office: [phone]  /  Fax: [phone]   HPI:   Chief Complaint: OBESITY Tracy Anderson is here to discuss her progress with her obesity treatment plan. She is on the portion control better and make smarter food choices, such as increase vegetables and decrease simple carbohydrates and is following her eating plan approximately 70 % of the time. She states she is exercising 0 minutes 0 times per week. Tracy Anderson had celebration eating and got off track with her eating. She is motivated to get back on track and continue with weight loss.  Her weight is 181 lb (82.1 kg) today and has gained 1 pound since her last visit. She has lost 3 lbs since starting treatment with [place].  Diabetes II Tracy Anderson has a diagnosis of diabetes type II. Tracy Anderson states fasting BGs range between 100's and 130's and post prandial range between 115 and 190's. She denies any hypoglycemic episodes. Last A1c was 7.0. She has been working on intensive lifestyle modifications including diet, exercise, and weight loss to help control her blood glucose levels.  At risk for cardiovascular disease Tracy Anderson is at a higher than average risk for cardiovascular disease due to obesity and diabetes II. She currently denies any chest pain.  Depression with emotional eating behaviors Tracy Anderson is struggling with emotional eating and using food for comfort to the extent that it is negatively impacting her health. She often snacks when she is not hungry. Tracy Anderson sometimes feels she is out of control and then feels guilty that she made poor food choices. She has been working on behavior modification techniques to help reduce her emotional eating and has been somewhat successful. Her mood is stable and she shows no sign of suicidal or homicidal ideations.  Depression screen PHQ 2/9 07/17/2017  Decreased Interest 1  Down, Depressed, Hopeless 1  PHQ - 2 Score 2  Altered sleeping 2  Tired, decreased energy 1  Change in appetite 1  Feeling bad  or failure about yourself  2  Trouble concentrating 0  Moving slowly or fidgety/restless 0  Suicidal thoughts 0  PHQ-9 Score 8  Difficult doing work/chores Not difficult at all   ALLERGIES: Allergies  Allergen Reactions  . Lisinopril Cough  . Shrimp [Shellfish Allergy] Diarrhea    Severe stomach cramps and diarrhea  . Clarithromycin Other (See Comments)    UNSPECIFIED     MEDICATIONS: Current Outpatient Medications on File Prior to Visit  Medication Sig Dispense Refill  . glucose blood (ACCU-CHEK GUIDE) test strip Use 2 X a day and diagnosis code is E 11.9 100 each 0  . Insulin Pen Needle 32G X 4 MM MISC 1 Package by Does not apply route daily at 12 noon. 100 each 0  . losartan (COZAAR) 50 MG tablet Take 1 tablet (50 mg total) by mouth daily. 30 tablet 0  . metFORMIN (GLUCOPHAGE) 500 MG tablet Take 500mg  in the am by mouth and 1000mg  in the pm daily 60 tablet 0  . Vitamin D, Ergocalciferol, (DRISDOL) 50000 units CAPS capsule Take 1 capsule (50,000 Units total) by mouth every 7 (seven) days. 4 capsule 0   No current facility-administered medications on file prior to visit.     PAST MEDICAL HISTORY: Past Medical History:  Diagnosis Date  . Arthritis   . Asthma    as a child  . Back pain   . Depression   . Diabetes mellitus type II   . Herpes genitalia   . Hyperlipidemia   . Hypertension   .  Shellfish allergy   . Shingles 2012   right flank    PAST SURGICAL HISTORY: Past Surgical History:  Procedure Laterality Date  . CHOLECYSTECTOMY N/A 07/05/2016   Procedure: LAPAROSCOPIC CHOLECYSTECTOMY;  Surgeon: Georganna Skeans, MD;  Location: [facility];  Service: General;  Laterality: N/A;  . colonscopy  05/20/09   per Dr. Deatra Ina, hyperplstic polyps, repeat in 10 yers   . TYMPANOSTOMY TUBE PLACEMENT      SOCIAL HISTORY: Social History   Tobacco Use  . Smoking status: Former Smoker    Last attempt to quit: 05/05/1991    Years since quitting: 26.8  . Smokeless tobacco: Never  Used  Substance Use Topics  . Alcohol use: Yes    Alcohol/week: 1.2 oz    Types: 2 Standard drinks or equivalent per week    Comment: occ  . Drug use: No    FAMILY HISTORY: Family History  Problem Relation Age of Onset  . Arthritis Other   . Cancer Other        cervical  . Depression Other   . Diabetes Other   . Ulcers Other   . Breast cancer Maternal Grandmother   . Diabetes Mother   . Heart disease Mother   . Cancer Mother   . Depression Mother   . Alcoholism Mother   . Eating disorder Mother   . Diabetes Father   . Hypertension Father   . Depression Father   . Alcoholism Father     ROS: Review of Systems  Constitutional: Negative for weight loss.  Cardiovascular: Negative for chest pain.  Endo/Heme/Allergies:       Negative hypoglycemia  Psychiatric/Behavioral: Positive for depression. Negative for suicidal ideas.    PHYSICAL EXAM: Blood pressure 128/81, pulse 78, temperature 97.6 F (36.4 C), temperature source Oral, height 5\' 7"  (1.702 m), weight 181 lb (82.1 kg), last menstrual period 08/08/2012, SpO2 97 %. Body mass index is 28.35 kg/m. Physical Exam  Constitutional: She is oriented to person, place, and time. She appears well-developed and well-nourished.  Cardiovascular: Normal rate.  Pulmonary/Chest: Effort normal.  Musculoskeletal: Normal range of motion.  Neurological: She is oriented to person, place, and time.  Skin: Skin is warm and dry.  Psychiatric: She has a normal mood and affect. Her behavior is normal.  Vitals reviewed.   RECENT LABS AND TESTS: BMET    Component Value Date/Time   NA 143 10/23/2017 0826   K 4.8 10/23/2017 0826   CL 102 10/23/2017 0826   CO2 25 10/23/2017 0826   GLUCOSE 109 (H) 10/23/2017 0826   GLUCOSE 127 (H) 04/23/2017 0925   BUN 14 10/23/2017 0826   CREATININE 0.77 10/23/2017 0826   CALCIUM 9.6 10/23/2017 0826   GFRNONAA 85 10/23/2017 0826   GFRAA 98 10/23/2017 0826   Lab Results  Component Value Date    HGBA1C 7.0 (H) 10/23/2017   HGBA1C 7.0 (H) 07/18/2017   HGBA1C 7.6 (H) 04/23/2017   HGBA1C 7.5 (H) 01/11/2017   HGBA1C 7.8 (H) 10/02/2016   Lab Results  Component Value Date   INSULIN 11.5 10/23/2017   INSULIN 11.2 07/18/2017   CBC    Component Value Date/Time   WBC 5.5 07/18/2017 0815   WBC 6.2 04/23/2017 0925   RBC 4.60 07/18/2017 0815   RBC 4.78 04/23/2017 0925   HGB 13.1 07/18/2017 0815   HCT 40.1 07/18/2017 0815   PLT 250.0 04/23/2017 0925   MCV 87 07/18/2017 0815   MCH 28.5 07/18/2017 0815   MCH 28.2  06/27/2016 0906   MCHC 32.7 07/18/2017 0815   MCHC 32.8 04/23/2017 0925   RDW 13.4 07/18/2017 0815   LYMPHSABS 1.6 07/18/2017 0815   MONOABS 0.4 04/23/2017 0925   EOSABS 0.1 07/18/2017 0815   BASOSABS 0.0 07/18/2017 0815   Iron/TIBC/Ferritin/ %Sat No results found for: IRON, TIBC, FERRITIN, IRONPCTSAT Lipid Panel     Component Value Date/Time   CHOL 155 10/23/2017 0826   TRIG 144 10/23/2017 0826   HDL 53 10/23/2017 0826   CHOLHDL 4 04/23/2017 0925   VLDL 47.2 (H) 04/23/2017 0925   LDLCALC 73 10/23/2017 0826   LDLDIRECT 82.0 04/23/2017 0925   Hepatic Function Panel     Component Value Date/Time   PROT 7.1 10/23/2017 0826   ALBUMIN 4.6 10/23/2017 0826   AST 13 10/23/2017 0826   ALT 20 10/23/2017 0826   ALKPHOS 74 10/23/2017 0826   BILITOT 0.5 10/23/2017 0826   BILIDIR 0.1 04/23/2017 0925      Component Value Date/Time   TSH 2.130 07/18/2017 0815   TSH 1.15 04/23/2017 0925   TSH 1.77 03/21/2016 0732    ASSESSMENT AND PLAN: Type 2 diabetes mellitus without complication, without long-term current use of insulin (HCC) - Plan: liraglutide (VICTOZA) 18 MG/3ML SOPN, FARXIGA 10 MG TABS tablet, Lancets (ACCU-CHEK SOFT TOUCH) lancets  Other depression - with emotional eating - Plan: buPROPion (WELLBUTRIN SR) 150 MG 12 hr tablet  At risk for heart disease  Class 1 obesity with serious comorbidity and body mass index (BMI) of 30.0 to 30.9 in adult,  unspecified obesity type - Starting BMI greater then 30  PLAN:  Diabetes II Tracy Anderson has been given extensive diabetes education by myself today including ideal fasting and post-prandial blood glucose readings, individual ideal Hgb A1c goals and hypoglycemia prevention. We discussed the importance of good blood sugar control to decrease the likelihood of diabetic complications such as nephropathy, neuropathy, limb loss, blindness, coronary artery disease, and death. We discussed the importance of intensive lifestyle modification including diet, exercise and weight loss as the first line treatment for diabetes. Tracy Anderson agrees to continue Victoza 3 pens (1.8 mg) and we will refill for 1 month, she agrees to continue Farxiga 10 mg qd #30 and we will refill for 1 month, and we will refill lancets. Tracy Anderson agrees to follow up with our clinic in 3 weeks.  Cardiovascular risk counselling Tracy Anderson was given extended (15 minutes) coronary artery disease prevention counseling today. She is 57 y.o. female and has risk factors for heart disease including obesity and diabetes II. We discussed intensive lifestyle modifications today with an emphasis on specific weight loss instructions and strategies. Pt was also informed of the importance of increasing exercise and decreasing saturated fats to help prevent heart disease.  Depression with Emotional Eating Behaviors We discussed behavior modification techniques today to help Tracy Anderson deal with her emotional eating and depression. Tracy Anderson agrees to continue taking Wellbutrin SR 150 mg BID #60 and we will refill for 1 month. Tracy Anderson agrees to follow up with our clinic in 3 weeks.  Obesity Tracy Anderson is currently in the action stage of change. As such, her goal is to continue with weight loss efforts She has agreed to follow the Pescatarian eating plan Tracy Anderson has been instructed to work up to a goal of 150 minutes of combined cardio and strengthening exercise per  week for weight loss and overall health benefits. We discussed the following Behavioral Modification Strategies today: increasing lean protein intake and work on meal planning and  easy cooking plans   Tracy Anderson has agreed to follow up with our clinic in 3 weeks. She was informed of the importance of frequent follow up visits to maximize her success with intensive lifestyle modifications for her multiple health conditions.   OBESITY BEHAVIORAL INTERVENTION VISIT  Today's visit was # 14 out of 22.  Starting weight: 184 lbs Starting date: 07/17/17 Today's weight : 181 lbs  Today's date: 03/19/2018 Total lbs lost to date: 3 (Patients must lose 7 lbs in the first 6 months to continue with counseling)   ASK: We discussed the diagnosis of obesity with Tracy Anderson today and Tracy Anderson agreed to give [place] permission to discuss obesity behavioral modification therapy today.  ASSESS: Tracy Anderson has the diagnosis of obesity and her BMI today is 28.34 Tracy Anderson is in the action stage of change   ADVISE: Kasyn was educated on the multiple health risks of obesity as well as the benefit of weight loss to improve her health. She was advised of the need for long term treatment and the importance of lifestyle modifications.  AGREE: Multiple dietary modification options and treatment options were discussed and  Tracy Anderson agreed to the above obesity treatment plan.   Tracy Anderson, am acting as transcriptionist for Lacy Duverney, PA-C I, Lacy Duverney [facility], have reviewed this note and agree with its content

## 2018-03-19 NOTE — Telephone Encounter (Signed)
There was a form scanned in the chart on 03/07/2018 and was signed back in April 2019.

## 2018-03-25 NOTE — Telephone Encounter (Signed)
The form was signed and is in [facility] folder

## 2018-03-25 NOTE — Telephone Encounter (Signed)
Form printed and placed in provider's red folder for completion. It appears only page 1 and not page 2 was completed when form was received last month.

## 2018-03-26 NOTE — Telephone Encounter (Signed)
Rx/forms faxed. Fax confirmation received.  

## 2018-04-04 ENCOUNTER — Ambulatory Visit: Payer: 59 | Admitting: Orthotics

## 2018-04-04 DIAGNOSIS — M205X2 Other deformities of toe(s) (acquired), left foot: Secondary | ICD-10-CM

## 2018-04-04 DIAGNOSIS — M778 Other enthesopathies, not elsewhere classified: Secondary | ICD-10-CM

## 2018-04-04 DIAGNOSIS — M779 Enthesopathy, unspecified: Principal | ICD-10-CM

## 2018-04-04 NOTE — Progress Notes (Signed)
Make adjustments:  Reverse morton's, no post (dress), 1st met head cut out left.

## 2018-04-09 ENCOUNTER — Encounter (INDEPENDENT_AMBULATORY_CARE_PROVIDER_SITE_OTHER): Payer: Self-pay | Admitting: Family Medicine

## 2018-04-10 ENCOUNTER — Encounter: Payer: Self-pay | Admitting: Podiatry

## 2018-04-10 MED FILL — metFORMIN HCL 500 MG TABS: 500 | 20 days supply | Qty: 60 | Fill #0

## 2018-04-11 ENCOUNTER — Ambulatory Visit (INDEPENDENT_AMBULATORY_CARE_PROVIDER_SITE_OTHER): Payer: 59 | Admitting: Physician Assistant

## 2018-04-11 VITALS — BP 121/74 | HR 83 | Temp 97.9°F | Ht 67.0 in | Wt 180.0 lb

## 2018-04-11 DIAGNOSIS — Z9189 Other specified personal risk factors, not elsewhere classified: Secondary | ICD-10-CM | POA: Diagnosis not present

## 2018-04-11 DIAGNOSIS — E119 Type 2 diabetes mellitus without complications: Secondary | ICD-10-CM

## 2018-04-11 DIAGNOSIS — E669 Obesity, unspecified: Secondary | ICD-10-CM

## 2018-04-11 DIAGNOSIS — Z6830 Body mass index (BMI) 30.0-30.9, adult: Secondary | ICD-10-CM | POA: Diagnosis not present

## 2018-04-11 DIAGNOSIS — E559 Vitamin D deficiency, unspecified: Secondary | ICD-10-CM | POA: Diagnosis not present

## 2018-04-11 MED ORDER — FARXIGA 10 MG PO TABS: 10.0000 mg | ORAL_TABLET | Freq: Every day | ORAL | 0 refills | Status: DC

## 2018-04-11 MED ORDER — LIRAGLUTIDE 18 MG/3ML ~~LOC~~ SOPN: 1.8000 mg | PEN_INJECTOR | Freq: Every morning | SUBCUTANEOUS | 0 refills | Status: DC

## 2018-04-11 MED ORDER — VITAMIN D (ERGOCALCIFEROL) 1.25 MG (50000 UNIT) PO CAPS: 50000.0000 [IU] | ORAL_CAPSULE | ORAL | 0 refills | Status: DC

## 2018-04-11 MED FILL — VIT D2 1.25 MG (50,000 UNIT: 1.25 MG | 28 days supply | Qty: 4 | Fill #0

## 2018-04-11 NOTE — Progress Notes (Addendum)
Office: [phone]  /  Fax: [phone]   HPI:   Chief Complaint: OBESITY [place] is here to discuss her progress with her obesity treatment plan. She is on the Pescatarian eating plan and is following her eating plan approximately 60 % of the time. She states she is exercising 0 minutes 0 times per week. Aritza  Continues to do well with weight loss. She makes mindful food choices and she controls her portions. She would like boredom eating strategies. Her weight is 180 lb (81.6 kg) today and has had a weight loss of 1 pounds over a period of 3 weeks since her last visit. She has lost 4 lbs since starting treatment with [place].  Diabetes II without complications, on insulin Carol has a diagnosis of diabetes type II and is currently on metformin. Delmar states fasting BGs range between 110's and 150's and she denies any hypoglycemic episodes. Last A1c was at 7.0  She has been working on intensive lifestyle modifications including diet, exercise, and weight loss to help control her blood glucose levels.  Vitamin D deficiency Brittni has a diagnosis of vitamin D deficiency. She is currently taking vit D and denies nausea, vomiting or muscle weakness.  At risk for osteopenia and osteoporosis Emaley is at higher risk of osteopenia and osteoporosis due to vitamin D deficiency.   ALLERGIES: Allergies  Allergen Reactions  . Lisinopril Cough  . Shrimp [Shellfish Allergy] Diarrhea    Severe stomach cramps and diarrhea  . Clarithromycin Other (See Comments)    UNSPECIFIED     MEDICATIONS: Current Outpatient Medications on File Prior to Visit  Medication Sig Dispense Refill  . buPROPion (WELLBUTRIN SR) 150 MG 12 hr tablet Take 1 tablet (150 mg total) by mouth 2 (two) times daily. 60 tablet 0  . FARXIGA 10 MG TABS tablet Take 10 mg by mouth daily. 30 tablet 0  . glucose blood (ACCU-CHEK GUIDE) test strip Use 2 X a day and diagnosis code is E 11.9 100 each 0  . Insulin Pen Needle  32G X 4 MM MISC 1 Package by Does not apply route daily at 12 noon. 100 each 0  . Lancets (ACCU-CHEK SOFT TOUCH) lancets Use 2 X a day and diagnosis code is E 11.9 100 each 0  . liraglutide (VICTOZA) 18 MG/3ML SOPN Inject 0.3 mLs (1.8 mg total) into the skin every morning. 3 pen 0  . losartan (COZAAR) 50 MG tablet Take 1 tablet (50 mg total) by mouth daily. 30 tablet 0  . metFORMIN (GLUCOPHAGE) 500 MG tablet Take 500mg  in the am by mouth and 1000mg  in the pm daily 60 tablet 0  . Vitamin D, Ergocalciferol, (DRISDOL) 50000 units CAPS capsule Take 1 capsule (50,000 Units total) by mouth every 7 (seven) days. 4 capsule 0   No current facility-administered medications on file prior to visit.     PAST MEDICAL HISTORY: Past Medical History:  Diagnosis Date  . Arthritis   . Asthma    as a child  . Back pain   . Depression   . Diabetes mellitus type II   . Herpes genitalia   . Hyperlipidemia   . Hypertension   . Shellfish allergy   . Shingles 2012   right flank    PAST SURGICAL HISTORY: Past Surgical History:  Procedure Laterality Date  . CHOLECYSTECTOMY N/A 07/05/2016   Procedure: LAPAROSCOPIC CHOLECYSTECTOMY;  Surgeon: Georganna Skeans, MD;  Location: [facility];  Service: General;  Laterality: N/A;  . colonscopy  05/20/09  per Dr. Deatra Ina, hyperplstic polyps, repeat in 10 yers   . TYMPANOSTOMY TUBE PLACEMENT      SOCIAL HISTORY: Social History   Tobacco Use  . Smoking status: Former Smoker    Last attempt to quit: 05/05/1991    Years since quitting: 26.9  . Smokeless tobacco: Never Used  Substance Use Topics  . Alcohol use: Yes    Alcohol/week: 1.2 oz    Types: 2 Standard drinks or equivalent per week    Comment: occ  . Drug use: No    FAMILY HISTORY: Family History  Problem Relation Age of Onset  . Arthritis Other   . Cancer Other        cervical  . Depression Other   . Diabetes Other   . Ulcers Other   . Breast cancer Maternal Grandmother   . Diabetes Mother   .  Heart disease Mother   . Cancer Mother   . Depression Mother   . Alcoholism Mother   . Eating disorder Mother   . Diabetes Father   . Hypertension Father   . Depression Father   . Alcoholism Father     ROS: Review of Systems  Constitutional: Positive for weight loss.  Gastrointestinal: Negative for nausea and vomiting.  Musculoskeletal:       Negative for muscle weakness  Endo/Heme/Allergies:       Negative for hypoglycemia    PHYSICAL EXAM: Blood pressure 121/74, pulse 83, temperature 97.9 F (36.6 C), temperature source Oral, height 5\' 7"  (1.702 m), weight 180 lb (81.6 kg), last menstrual period 08/08/2012, SpO2 99 %. Body mass index is 28.19 kg/m. Physical Exam  Constitutional: She is oriented to person, place, and time. She appears well-developed and well-nourished.  Cardiovascular: Normal rate.  Pulmonary/Chest: Effort normal.  Musculoskeletal: Normal range of motion.  Neurological: She is oriented to person, place, and time.  Skin: Skin is warm and dry.  Psychiatric: She has a normal mood and affect. Her behavior is normal.  Vitals reviewed.   RECENT LABS AND TESTS: BMET    Component Value Date/Time   NA 143 10/23/2017 0826   K 4.8 10/23/2017 0826   CL 102 10/23/2017 0826   CO2 25 10/23/2017 0826   GLUCOSE 109 (H) 10/23/2017 0826   GLUCOSE 127 (H) 04/23/2017 0925   BUN 14 10/23/2017 0826   CREATININE 0.77 10/23/2017 0826   CALCIUM 9.6 10/23/2017 0826   GFRNONAA 85 10/23/2017 0826   GFRAA 98 10/23/2017 0826   Lab Results  Component Value Date   HGBA1C 7.0 (H) 10/23/2017   HGBA1C 7.0 (H) 07/18/2017   HGBA1C 7.6 (H) 04/23/2017   HGBA1C 7.5 (H) 01/11/2017   HGBA1C 7.8 (H) 10/02/2016   Lab Results  Component Value Date   INSULIN 11.5 10/23/2017   INSULIN 11.2 07/18/2017   CBC    Component Value Date/Time   WBC 5.5 07/18/2017 0815   WBC 6.2 04/23/2017 0925   RBC 4.60 07/18/2017 0815   RBC 4.78 04/23/2017 0925   HGB 13.1 07/18/2017 0815   HCT  40.1 07/18/2017 0815   PLT 250.0 04/23/2017 0925   MCV 87 07/18/2017 0815   MCH 28.5 07/18/2017 0815   MCH 28.2 06/27/2016 0906   MCHC 32.7 07/18/2017 0815   MCHC 32.8 04/23/2017 0925   RDW 13.4 07/18/2017 0815   LYMPHSABS 1.6 07/18/2017 0815   MONOABS 0.4 04/23/2017 0925   EOSABS 0.1 07/18/2017 0815   BASOSABS 0.0 07/18/2017 0815   Iron/TIBC/Ferritin/ %Sat No results found  for: IRON, TIBC, FERRITIN, IRONPCTSAT Lipid Panel     Component Value Date/Time   CHOL 155 10/23/2017 0826   TRIG 144 10/23/2017 0826   HDL 53 10/23/2017 0826   CHOLHDL 4 04/23/2017 0925   VLDL 47.2 (H) 04/23/2017 0925   LDLCALC 73 10/23/2017 0826   LDLDIRECT 82.0 04/23/2017 0925   Hepatic Function Panel     Component Value Date/Time   PROT 7.1 10/23/2017 0826   ALBUMIN 4.6 10/23/2017 0826   AST 13 10/23/2017 0826   ALT 20 10/23/2017 0826   ALKPHOS 74 10/23/2017 0826   BILITOT 0.5 10/23/2017 0826   BILIDIR 0.1 04/23/2017 0925      Component Value Date/Time   TSH 2.130 07/18/2017 0815   TSH 1.15 04/23/2017 0925   TSH 1.77 03/21/2016 0732   Results for EILENE, VOIGT (MRN [id number]) as of 04/11/2018 13:44  Ref. Range 10/23/2017 08:26  Vitamin D, 25-Hydroxy Latest Ref Range: 30.0 - 100.0 ng/mL 51.3   ASSESSMENT AND PLAN: Controlled type 2 diabetes mellitus without complication, with long-term current use of insulin (HCC)  Vitamin D deficiency - Plan: Vitamin D, Ergocalciferol, (DRISDOL) 50000 units CAPS capsule  At risk for osteoporosis  Class 1 obesity with serious comorbidity and body mass index (BMI) of 30.0 to 30.9 in adult, unspecified obesity type - beginning BMI >30  Type 2 diabetes mellitus without complication, without long-term current use of insulin (HCC) - Plan: FARXIGA 10 MG TABS tablet, liraglutide (VICTOZA) 18 MG/3ML SOPN  PLAN:  Diabetes II without complications, on insulin Latrish has been given extensive diabetes education by myself today including ideal fasting and  post-prandial blood glucose readings, individual ideal Hgb A1c goals and hypoglycemia prevention. We discussed the importance of good blood sugar control to decrease the likelihood of diabetic complications such as nephropathy, neuropathy, limb loss, blindness, coronary artery disease, and death. We discussed the importance of intensive lifestyle modification including diet, exercise and weight loss as the first line treatment for diabetes. Linetta agrees to continue victoza #3 pens (1.8 mg) and we will refill for 1 month. She agrees to continue farxiga 10 mg daily #30 and we will refill for 1 month. Tykeshia agrees to follow up at the agreed upon time.  Vitamin D Deficiency Ezinne was informed that low vitamin D levels contributes to fatigue and are associated with obesity, breast, and colon cancer. She agrees to continue to take prescription Vit D @50 ,000 IU every week #4 with no refills and will follow up for routine testing of vitamin D, at least 2-3 times per year. She was informed of the risk of over-replacement of vitamin D and agrees to not increase her dose unless she discusses this with [place] first. Elaynah agrees to follow up as directed.  At risk for osteopenia and osteoporosis Harshita is at risk for osteopenia and osteoporosis due to her vitamin D deficiency. She was encouraged to take her vitamin D and follow her higher calcium diet and increase strengthening exercise to help strengthen her bones and decrease her risk of osteopenia and osteoporosis.  Obesity Lyndzie is currently in the action stage of change. As such, her goal is to continue with weight loss efforts She has agreed to follow the Pescatarian eating plan Jaquana has been instructed to work up to a goal of 150 minutes of combined cardio and strengthening exercise per week for weight loss and overall health benefits. We discussed the following Behavioral Modification Strategies today: emotional eating strategies and ways to  avoid boredom  eating  Azlin has agreed to follow up with our clinic in 2 to 3 weeks. She was informed of the importance of frequent follow up visits to maximize her success with intensive lifestyle modifications for her multiple health conditions.   OBESITY BEHAVIORAL INTERVENTION VISIT  Today's visit was # 15 out of 22.  Starting weight: 184 lbs Starting date: 07/17/17 Today's weight : 180 lbs Today's date: 04/11/2018 Total lbs lost to date: 4 (Patients must lose 7 lbs in the first 6 months to continue with counseling)   ASK: We discussed the diagnosis of obesity with Yolonda Kida today and Joelene Millin agreed to give [place] permission to discuss obesity behavioral modification therapy today.  ASSESS: Arora has the diagnosis of obesity and her BMI today is 28.19 Kasheena is in the action stage of change   ADVISE: Kashmere was educated on the multiple health risks of obesity as well as the benefit of weight loss to improve her health. She was advised of the need for long term treatment and the importance of lifestyle modifications.  AGREE: Multiple dietary modification options and treatment options were discussed and  Margaree agreed to the above obesity treatment plan.   Corey Skains, am acting as transcriptionist for [employer], PA-C I, Lacy Duverney [facility], have reviewed this note and agree with its content

## 2018-04-12 MED FILL — FARXIGA 10 MG TABLET: 10 | 30 days supply | Qty: 30 | Fill #0

## 2018-04-12 MED FILL — VICTOZA 18 MG/3 ML INJECT P: 18 | 30 days supply | Qty: 9 | Fill #0

## 2018-04-18 ENCOUNTER — Other Ambulatory Visit: Payer: 59 | Admitting: Orthotics

## 2018-04-26 ENCOUNTER — Ambulatory Visit (INDEPENDENT_AMBULATORY_CARE_PROVIDER_SITE_OTHER): Payer: 59 | Admitting: Family Medicine

## 2018-04-26 ENCOUNTER — Encounter: Payer: Self-pay | Admitting: Family Medicine

## 2018-04-26 VITALS — BP 120/70 | HR 67 | Temp 97.6°F | Ht 67.0 in | Wt 182.8 lb

## 2018-04-26 DIAGNOSIS — I10 Essential (primary) hypertension: Secondary | ICD-10-CM

## 2018-04-26 DIAGNOSIS — Z209 Contact with and (suspected) exposure to unspecified communicable disease: Secondary | ICD-10-CM

## 2018-04-26 DIAGNOSIS — Z0000 Encounter for general adult medical examination without abnormal findings: Secondary | ICD-10-CM

## 2018-04-26 DIAGNOSIS — E559 Vitamin D deficiency, unspecified: Secondary | ICD-10-CM

## 2018-04-26 DIAGNOSIS — E119 Type 2 diabetes mellitus without complications: Secondary | ICD-10-CM

## 2018-04-26 LAB — BASIC METABOLIC PANEL
BUN: 21 mg/dL (ref 6–23)
CALCIUM: 9.6 mg/dL (ref 8.4–10.5)
CO2: 28 meq/L (ref 19–32)
CREATININE: 0.81 mg/dL (ref 0.40–1.20)
Chloride: 101 mEq/L (ref 96–112)
GFR: 76.9 mL/min (ref >60.00)
Glucose, Bld: 112 mg/dL — ABNORMAL HIGH (ref 70–99)
Potassium: 5.1 mEq/L (ref 3.5–5.1)
Sodium: 139 mEq/L (ref 135–145)

## 2018-04-26 LAB — TSH: TSH: 1.48 u[IU]/mL (ref 0.35–4.50)

## 2018-04-26 LAB — LDL CHOLESTEROL, DIRECT: Direct LDL: 98 mg/dL

## 2018-04-26 LAB — CBC WITH DIFFERENTIAL/PLATELET
BASOS PCT: 0.3 % (ref 0.0–3.0)
Basophils Absolute: 0 10*3/uL (ref 0.0–0.1)
EOS ABS: 0.1 10*3/uL (ref 0.0–0.7)
EOS PCT: 2.4 % (ref 0.0–5.0)
HCT: 40.9 % (ref 36.0–46.0)
Hemoglobin: 13.8 g/dL (ref 12.0–15.0)
LYMPHS PCT: 29.6 % (ref 12.0–46.0)
Lymphs Abs: 1.6 10*3/uL (ref 0.7–4.0)
MCHC: 33.7 g/dL (ref 30.0–36.0)
MCV: 87.1 fl (ref 78.0–100.0)
MONO ABS: 0.4 10*3/uL (ref 0.1–1.0)
Monocytes Relative: 6.7 % (ref 3.0–12.0)
NEUTROS ABS: 3.3 10*3/uL (ref 1.4–7.7)
Neutrophils Relative %: 61 % (ref 43.0–77.0)
PLATELETS: 277 10*3/uL (ref 150.0–400.0)
RBC: 4.7 Mil/uL (ref 3.87–5.11)
RDW: 13.4 % (ref 11.5–15.5)
WBC: 5.4 10*3/uL (ref 4.0–10.5)

## 2018-04-26 LAB — HEPATIC FUNCTION PANEL
ALT: 19 U/L (ref 0–35)
AST: 14 U/L (ref 0–37)
Albumin: 4.7 g/dL (ref 3.5–5.2)
Alkaline Phosphatase: 75 U/L (ref 39–117)
BILIRUBIN DIRECT: 0.1 mg/dL (ref 0.0–0.3)
TOTAL PROTEIN: 7.5 g/dL (ref 6.0–8.3)
Total Bilirubin: 0.6 mg/dL (ref 0.2–1.2)

## 2018-04-26 LAB — LIPID PANEL
Cholesterol: 214 mg/dL — ABNORMAL HIGH (ref 0–200)
HDL: 45.4 mg/dL (ref >39.00)
NONHDL: 168.13
TRIGLYCERIDES: 278 mg/dL — AB (ref 0.0–149.0)
Total CHOL/HDL Ratio: 5
VLDL: 55.6 mg/dL — ABNORMAL HIGH (ref 0.0–40.0)

## 2018-04-26 LAB — POC URINALSYSI DIPSTICK (AUTOMATED)
Bilirubin, UA: NEGATIVE
Glucose, UA: POSITIVE — AB
Ketones, UA: NEGATIVE
Leukocytes, UA: NEGATIVE
Nitrite, UA: NEGATIVE
PH UA: 6 (ref 5.0–8.0)
PROTEIN UA: NEGATIVE
RBC UA: NEGATIVE
SPEC GRAV UA: 1.025 (ref 1.010–1.025)
UROBILINOGEN UA: 0.2 U/dL

## 2018-04-26 LAB — VITAMIN D 25 HYDROXY (VIT D DEFICIENCY, FRACTURES): VITD: 68.29 ng/mL (ref 30.00–100.00)

## 2018-04-26 LAB — HEMOGLOBIN A1C: Hgb A1c MFr Bld: 6.7 % — ABNORMAL HIGH (ref 4.6–6.5)

## 2018-04-26 MED ORDER — GLUCOSE BLOOD VI STRP: ORAL_STRIP | 3 refills | Status: DC

## 2018-04-26 MED ORDER — VITAMIN D (ERGOCALCIFEROL) 1.25 MG (50000 UNIT) PO CAPS: 50000.0000 [IU] | ORAL_CAPSULE | ORAL | 3 refills | Status: DC

## 2018-04-26 MED ORDER — FARXIGA 10 MG PO TABS: 10.0000 mg | ORAL_TABLET | Freq: Every day | ORAL | 3 refills | Status: DC

## 2018-04-26 MED ORDER — BUPROPION HCL ER (XL) 150 MG PO TB24: 150.0000 mg | ORAL_TABLET | Freq: Every day | ORAL | 3 refills | Status: DC

## 2018-04-26 MED ORDER — LIRAGLUTIDE 18 MG/3ML ~~LOC~~ SOPN: 1.8000 mg | PEN_INJECTOR | Freq: Every morning | SUBCUTANEOUS | 3 refills | Status: DC

## 2018-04-26 MED ORDER — LOSARTAN POTASSIUM 50 MG PO TABS: 50.0000 mg | ORAL_TABLET | Freq: Every day | ORAL | 3 refills | Status: DC

## 2018-04-26 MED ORDER — METFORMIN HCL 500 MG PO TABS: ORAL_TABLET | ORAL | 3 refills | Status: DC

## 2018-04-26 MED FILL — ACCU-CHEK GUIDE STRP: 50 days supply | Qty: 100 | Fill #0

## 2018-04-26 MED FILL — metFORMIN HCL 500 MG TABS: 500 | 90 days supply | Qty: 270 | Fill #0

## 2018-04-26 MED FILL — BUPROPION HCL XL 150 MG TAB: 150 | 90 days supply | Qty: 90 | Fill #0

## 2018-04-26 MED FILL — LOSARTAN POTASSIUM 50 MG TA: 50 | 90 days supply | Qty: 90 | Fill #0

## 2018-04-26 NOTE — Progress Notes (Signed)
   Subjective:    Patient ID: Tracy Anderson, female    DOB: 08-Dec-1960, 57 y.o.   MRN: [id number]  HPI Here for a well exam. She feels great. She had been seeing the [facility], but she is now turning the care of her diabetes back to [place]. Her last A1c in December was 7.0.    Review of Systems  Constitutional: Negative.   HENT: Negative.   Eyes: Negative.   Respiratory: Negative.   Cardiovascular: Negative.   Gastrointestinal: Negative.   Genitourinary: Negative for decreased urine volume, difficulty urinating, dyspareunia, dysuria, enuresis, flank pain, frequency, hematuria, pelvic pain and urgency.  Musculoskeletal: Negative.   Skin: Negative.   Neurological: Negative.   Psychiatric/Behavioral: Negative.        Objective:   Physical Exam  Constitutional: She is oriented to person, place, and time. She appears well-developed and well-nourished. No distress.  HENT:  Head: Normocephalic and atraumatic.  Right Ear: External ear normal.  Left Ear: External ear normal.  Nose: Nose normal.  Mouth/Throat: Oropharynx is clear and moist. No oropharyngeal exudate.  Eyes: Pupils are equal, round, and reactive to light. Conjunctivae and EOM are normal. No scleral icterus.  Neck: Normal range of motion. Neck supple. No JVD present. No thyromegaly present.  Cardiovascular: Normal rate, regular rhythm, normal heart sounds and intact distal pulses. Exam reveals no gallop and no friction rub.  No murmur heard. Pulmonary/Chest: Effort normal and breath sounds normal. No respiratory distress. She has no wheezes. She has no rales. She exhibits no tenderness.  Abdominal: Soft. Bowel sounds are normal. She exhibits no distension and no mass. There is no tenderness. There is no rebound and no guarding.  Musculoskeletal: Normal range of motion. She exhibits no edema or tenderness.  Lymphadenopathy:    She has no cervical adenopathy.  Neurological: She is alert and oriented to  person, place, and time. She has normal reflexes. She displays normal reflexes. No cranial nerve deficit. She exhibits normal muscle tone. Coordination normal.  Skin: Skin is warm and dry. No rash noted. No erythema.  Psychiatric: She has a normal mood and affect. Her behavior is normal. Judgment and thought content normal.          Assessment & Plan:  Well exam. We discussed diet and exercise. Get fasting labs.  Alysia Penna, MD

## 2018-04-27 LAB — HEPATITIS C ANTIBODY
HEP C AB: NONREACTIVE
SIGNAL TO CUT-OFF: 0.01 (ref <1.00)

## 2018-04-30 ENCOUNTER — Telehealth: Payer: Self-pay | Admitting: Family Medicine

## 2018-04-30 ENCOUNTER — Telehealth: Payer: Self-pay | Admitting: *Deleted

## 2018-04-30 DIAGNOSIS — E559 Vitamin D deficiency, unspecified: Secondary | ICD-10-CM

## 2018-04-30 MED ORDER — VITAMIN D (ERGOCALCIFEROL) 1.25 MG (50000 UNIT) PO CAPS
50000.0000 [IU] | ORAL_CAPSULE | ORAL | 3 refills | Status: DC
Start: 2018-04-30 — End: 2019-04-25

## 2018-04-30 NOTE — Telephone Encounter (Signed)
Yes to go over lab results will call pt back tomorrow.

## 2018-04-30 NOTE — Telephone Encounter (Signed)
Spoke with Janett Billow at [facility] for the [place] got  deleted at the pharmacy requests it be resent  -  Rx resent

## 2018-04-30 NOTE — Telephone Encounter (Signed)
Copied from [place] [id number]. Topic: General - Other >> Apr 30, 2018 12:15 PM Carolyn Stare wrote:  Pt call said she was returning [place] call

## 2018-04-30 NOTE — Telephone Encounter (Unsigned)
Copied from [place] [id number]. Topic: Quick Communication - Rx Refill/Question >> Apr 30, 2018  2:53 PM Neva Seat wrote: Vitamin D, Ergocalciferol, (DRISDOL) 50000 units CAPS capsule Was accidentally deleted at pharmacy. Please resend the Rx request to:  [facility] [place]. [address] [place] [address] Phone: [phone] Fax: [phone]

## 2018-05-06 MED FILL — SHINGRIX 50 MCG SUS: 50 | 1 days supply | Qty: 1 | Fill #0

## 2018-05-06 MED FILL — VIT D2 1.25 MG (50,000 UNIT: 1.25 MG | 84 days supply | Qty: 12 | Fill #0

## 2018-05-20 MED FILL — VICTOZA 18 MG/3 ML INJECT P: 18 | 30 days supply | Qty: 9 | Fill #0

## 2018-05-20 MED FILL — FARXIGA 10 MG TABLET: 10 | 90 days supply | Qty: 90 | Fill #0

## 2018-05-23 ENCOUNTER — Other Ambulatory Visit: Payer: Self-pay

## 2018-05-23 DIAGNOSIS — E119 Type 2 diabetes mellitus without complications: Secondary | ICD-10-CM

## 2018-05-23 MED ORDER — INSULIN PEN NEEDLE 32G X 4 MM MISC: 1.0000 | Freq: Every day | 0 refills | Status: DC

## 2018-05-23 MED FILL — UNIFINE PENTIPS 32GX5/32: 32G X 4 MM | 90 days supply | Qty: 100 | Fill #0

## 2018-05-23 MED FILL — UNIFINE PENTIPS 32GX5/32": 32G X 4 MM | 90 days supply | Qty: 100 | Fill #0

## 2018-05-28 ENCOUNTER — Encounter (INDEPENDENT_AMBULATORY_CARE_PROVIDER_SITE_OTHER): Payer: Self-pay | Admitting: Family Medicine

## 2018-06-24 ENCOUNTER — Other Ambulatory Visit (INDEPENDENT_AMBULATORY_CARE_PROVIDER_SITE_OTHER): Payer: Self-pay | Admitting: Physician Assistant

## 2018-06-24 DIAGNOSIS — E119 Type 2 diabetes mellitus without complications: Secondary | ICD-10-CM

## 2018-06-24 MED FILL — ACCU-CHEK GUIDE STRP: 50 days supply | Qty: 100 | Fill #1

## 2018-06-24 MED FILL — VICTOZA 18 MG/3 ML INJECT P: 18 | 30 days supply | Qty: 9 | Fill #1

## 2018-06-26 ENCOUNTER — Other Ambulatory Visit: Payer: Self-pay

## 2018-06-26 ENCOUNTER — Other Ambulatory Visit (INDEPENDENT_AMBULATORY_CARE_PROVIDER_SITE_OTHER): Payer: Self-pay | Admitting: Physician Assistant

## 2018-06-26 DIAGNOSIS — E119 Type 2 diabetes mellitus without complications: Secondary | ICD-10-CM

## 2018-06-26 MED ORDER — INSULIN PEN NEEDLE 32G X 4 MM MISC: 1.0000 | Freq: Every day | 5 refills | Status: DC

## 2018-07-03 ENCOUNTER — Ambulatory Visit (INDEPENDENT_AMBULATORY_CARE_PROVIDER_SITE_OTHER): Payer: 59 | Admitting: Physician Assistant

## 2018-07-04 ENCOUNTER — Ambulatory Visit (INDEPENDENT_AMBULATORY_CARE_PROVIDER_SITE_OTHER): Payer: 59 | Admitting: Physician Assistant

## 2018-07-10 ENCOUNTER — Other Ambulatory Visit (INDEPENDENT_AMBULATORY_CARE_PROVIDER_SITE_OTHER): Payer: Self-pay | Admitting: Physician Assistant

## 2018-07-10 DIAGNOSIS — E119 Type 2 diabetes mellitus without complications: Secondary | ICD-10-CM

## 2018-07-15 MED FILL — SHINGRIX 50 MCG SUS: 50 | 1 days supply | Qty: 1 | Fill #1

## 2018-07-18 MED FILL — VICTOZA 18 MG/3 ML INJECT P: 18 | 30 days supply | Qty: 9 | Fill #2

## 2018-07-19 ENCOUNTER — Encounter: Payer: Self-pay | Admitting: Family Medicine

## 2018-07-19 NOTE — Telephone Encounter (Signed)
Copied from [place] [phone]. Topic: Quick Communication - See Telephone Encounter >> Jul 19, 2018  4:33 PM Vernona Rieger wrote: CRM for notification. See Telephone encounter for: 07/19/18.  Patient would like a refill for   Lancets USE TWICE A DAY AS DIRECTED. Pharmacy has been sending it to the wrong office and patient will be out in a few days. Please advise.  [place], [place] - [address] [facility]. [address] [place] [address]

## 2018-07-22 MED ORDER — ACCU-CHEK FASTCLIX LANCETS MISC: 6 refills | Status: DC

## 2018-07-22 MED FILL — ACCU-CHEK FASTCLIX LANCETS: 51 days supply | Qty: 102 | Fill #0

## 2018-07-26 ENCOUNTER — Ambulatory Visit: Payer: 59 | Admitting: Family Medicine

## 2018-08-01 MED FILL — metFORMIN HCL 500 MG TABS: 500 | 90 days supply | Qty: 270 | Fill #1

## 2018-08-01 MED FILL — VIT D2 1.25 MG (50,000 UNIT: 1.25 MG | 84 days supply | Qty: 12 | Fill #1

## 2018-08-08 MED FILL — LOSARTAN POTASSIUM 50 MG TA: 50 | 90 days supply | Qty: 90 | Fill #1

## 2018-08-08 MED FILL — buPROPion HCL ER (XL) 150 M: 150 | 90 days supply | Qty: 90 | Fill #1

## 2018-08-09 ENCOUNTER — Ambulatory Visit: Payer: 59 | Admitting: Family Medicine

## 2018-08-09 ENCOUNTER — Encounter: Payer: Self-pay | Admitting: Family Medicine

## 2018-08-09 VITALS — BP 116/72 | HR 68 | Temp 97.8°F | Wt 182.6 lb

## 2018-08-09 DIAGNOSIS — I10 Essential (primary) hypertension: Secondary | ICD-10-CM | POA: Diagnosis not present

## 2018-08-09 DIAGNOSIS — E119 Type 2 diabetes mellitus without complications: Secondary | ICD-10-CM | POA: Diagnosis not present

## 2018-08-09 DIAGNOSIS — F418 Other specified anxiety disorders: Secondary | ICD-10-CM | POA: Diagnosis not present

## 2018-08-09 LAB — HEMOGLOBIN A1C: HEMOGLOBIN A1C: 6.9 % — AB (ref 4.6–6.5)

## 2018-08-09 MED ORDER — PHENTERMINE HCL 37.5 MG PO CAPS: 37.5000 mg | ORAL_CAPSULE | ORAL | 1 refills | Status: DC

## 2018-08-09 MED FILL — PHENTERMINE 37.5 MG TABLET: 37.5 | 90 days supply | Qty: 90 | Fill #0

## 2018-08-09 NOTE — Progress Notes (Signed)
   Subjective:    Patient ID: Tracy Anderson, female    DOB: 1961-04-17, 57 y.o.   MRN: [id number]  HPI Here to check up. She feels well. She asks to use Phentermine again for awhile. Her moods are stable.    Review of Systems  Constitutional: Negative.   Respiratory: Negative.   Cardiovascular: Negative.   Neurological: Negative.   Psychiatric/Behavioral: Negative.        Objective:   Physical Exam  Constitutional: She is oriented to person, place, and time. She appears well-developed and well-nourished.  Cardiovascular: Normal rate, regular rhythm, normal heart sounds and intact distal pulses.  Pulmonary/Chest: Effort normal and breath sounds normal.  Neurological: She is alert and oriented to person, place, and time.  Psychiatric: She has a normal mood and affect. Her behavior is normal. Thought content normal.          Assessment & Plan:  Her HTN and depression are stable. Check an A1c today. Alysia Penna, MD

## 2018-08-14 ENCOUNTER — Encounter: Payer: Self-pay | Admitting: *Deleted

## 2018-08-20 ENCOUNTER — Encounter: Payer: Self-pay | Admitting: Family Medicine

## 2018-08-22 MED FILL — VICTOZA 18 MG/3 ML INJECT P: 18 | 30 days supply | Qty: 9 | Fill #3

## 2018-08-30 MED FILL — FARXIGA 10 MG TABLET: 10 | 90 days supply | Qty: 90 | Fill #1

## 2018-09-19 MED FILL — UNIFINE PENTIPS 32GX5/32": 32G X 4 MM | 90 days supply | Qty: 100 | Fill #0

## 2018-09-19 MED FILL — UNIFINE PENTIPS 32GX5/32: 32G X 4 MM | 90 days supply | Qty: 100 | Fill #0

## 2018-09-24 ENCOUNTER — Other Ambulatory Visit: Payer: Self-pay | Admitting: Family Medicine

## 2018-09-24 DIAGNOSIS — E119 Type 2 diabetes mellitus without complications: Secondary | ICD-10-CM

## 2018-09-25 ENCOUNTER — Encounter: Payer: Self-pay | Admitting: Family Medicine

## 2018-09-25 DIAGNOSIS — E119 Type 2 diabetes mellitus without complications: Secondary | ICD-10-CM

## 2018-09-25 MED ORDER — LIRAGLUTIDE 18 MG/3ML ~~LOC~~ SOPN: 1.8000 mg | PEN_INJECTOR | Freq: Every morning | SUBCUTANEOUS | 3 refills | Status: DC

## 2018-09-25 MED FILL — VICTOZA 18 MG/3 ML INJECT P: 18 | 30 days supply | Qty: 9 | Fill #0

## 2018-10-28 MED FILL — VIT D2 1.25 MG (50,000 UNIT: 1.25 MG | 84 days supply | Qty: 12 | Fill #2

## 2018-10-28 MED FILL — VICTOZA 18 MG/3 ML INJECT P: 18 | 30 days supply | Qty: 9 | Fill #1

## 2018-10-31 MED FILL — ACCU-CHEK FASTCLIX LANCETS: 51 days supply | Qty: 102 | Fill #1

## 2018-10-31 MED FILL — metFORMIN HCL 500 MG TABS: 500 | 90 days supply | Qty: 270 | Fill #2

## 2018-11-14 MED FILL — LOSARTAN POTASSIUM 50 MG TA: 50 | 90 days supply | Qty: 90 | Fill #2

## 2018-11-15 DIAGNOSIS — H5213 Myopia, bilateral: Secondary | ICD-10-CM | POA: Diagnosis not present

## 2018-11-15 DIAGNOSIS — H52223 Regular astigmatism, bilateral: Secondary | ICD-10-CM | POA: Diagnosis not present

## 2018-11-28 MED FILL — VICTOZA 18 MG/3 ML INJECT P: 18 | 30 days supply | Qty: 9 | Fill #2

## 2018-12-05 MED FILL — FARXIGA 10 MG TABLET: 10 | 90 days supply | Qty: 90 | Fill #2

## 2018-12-05 MED FILL — ACCU-CHEK GUIDE STRP: 50 days supply | Qty: 100 | Fill #2

## 2018-12-05 MED FILL — buPROPion HCL ER (XL) 150 M: 150 | 90 days supply | Qty: 90 | Fill #2

## 2018-12-20 ENCOUNTER — Other Ambulatory Visit: Payer: Self-pay | Admitting: Physician Assistant

## 2018-12-20 DIAGNOSIS — C4491 Basal cell carcinoma of skin, unspecified: Secondary | ICD-10-CM

## 2018-12-20 DIAGNOSIS — C44311 Basal cell carcinoma of skin of nose: Secondary | ICD-10-CM | POA: Diagnosis not present

## 2018-12-20 DIAGNOSIS — D485 Neoplasm of uncertain behavior of skin: Secondary | ICD-10-CM | POA: Diagnosis not present

## 2018-12-20 HISTORY — DX: Basal cell carcinoma of skin, unspecified: C44.91

## 2018-12-30 MED FILL — UNIFINE PENTIPS 32GX5/32": 32G X 4 MM | 90 days supply | Qty: 100 | Fill #1

## 2018-12-30 MED FILL — UNIFINE PENTIPS 32GX5/32: 32G X 4 MM | 90 days supply | Qty: 100 | Fill #1 | Status: TO

## 2018-12-30 MED FILL — VICTOZA 18 MG/3 ML INJECT P: 18 | 30 days supply | Qty: 9 | Fill #3

## 2019-01-09 MED FILL — KETOCONAZOLE 2 % SHAM: 2 | 30 days supply | Qty: 120 | Fill #0

## 2019-01-23 MED FILL — LOSARTAN POTASSIUM 50 MG TA: 50 | 90 days supply | Qty: 90 | Fill #3

## 2019-01-23 MED FILL — VICTOZA 18 MG/3 ML INJECT P: 18 | 30 days supply | Qty: 9 | Fill #4 | Status: TO

## 2019-01-23 MED FILL — metFORMIN HCL 500 MG TABS: 500 | 90 days supply | Qty: 270 | Fill #3

## 2019-01-23 MED FILL — VIT D2 1.25 MG (50,000 UNIT: 1.25 MG | 84 days supply | Qty: 12 | Fill #3

## 2019-01-23 MED FILL — ACCU-CHEK GUIDE STRP: 50 days supply | Qty: 100 | Fill #3

## 2019-01-23 MED FILL — ACCU-CHEK FASTCLIX LANCETS: 51 days supply | Qty: 102 | Fill #2

## 2019-02-27 MED FILL — FARXIGA 10 MG TABLET: 10 | 90 days supply | Qty: 90 | Fill #0

## 2019-02-27 MED FILL — VICTOZA 18 MG/3 ML INJECT P: 18 | 30 days supply | Qty: 9 | Fill #0

## 2019-03-18 MED FILL — buPROPion HCL ER (XL) 150 M: 150 | 90 days supply | Qty: 90 | Fill #0

## 2019-03-20 DIAGNOSIS — C44311 Basal cell carcinoma of skin of nose: Secondary | ICD-10-CM | POA: Diagnosis not present

## 2019-04-03 MED FILL — UNIFINE PENTIPS 32GX5/32: 32G X 4 MM | 90 days supply | Qty: 100 | Fill #0

## 2019-04-03 MED FILL — UNIFINE PENTIPS 32GX5/32": 32G X 4 MM | 90 days supply | Qty: 100 | Fill #0

## 2019-04-03 MED FILL — VICTOZA 18 MG/3 ML INJECT P: 18 | 30 days supply | Qty: 9 | Fill #1

## 2019-04-04 ENCOUNTER — Other Ambulatory Visit: Payer: Self-pay | Admitting: Family Medicine

## 2019-04-04 DIAGNOSIS — Z1231 Encounter for screening mammogram for malignant neoplasm of breast: Secondary | ICD-10-CM

## 2019-04-22 ENCOUNTER — Other Ambulatory Visit: Payer: Self-pay | Admitting: Family Medicine

## 2019-04-22 DIAGNOSIS — E559 Vitamin D deficiency, unspecified: Secondary | ICD-10-CM

## 2019-04-25 ENCOUNTER — Encounter: Payer: Self-pay | Admitting: Gastroenterology

## 2019-04-25 MED FILL — VIT D2 1.25 MG (50,000 UNIT: 1.25 MG | 84 days supply | Qty: 12 | Fill #0

## 2019-04-25 NOTE — Telephone Encounter (Signed)
Patient is calling to check status of refill. Patient states she is completely out of this medication.

## 2019-05-01 ENCOUNTER — Other Ambulatory Visit: Payer: Self-pay | Admitting: Family Medicine

## 2019-05-02 ENCOUNTER — Encounter: Payer: Self-pay | Admitting: Family Medicine

## 2019-05-02 ENCOUNTER — Ambulatory Visit (INDEPENDENT_AMBULATORY_CARE_PROVIDER_SITE_OTHER): Payer: 59 | Admitting: Family Medicine

## 2019-05-02 ENCOUNTER — Other Ambulatory Visit: Payer: Self-pay

## 2019-05-02 VITALS — BP 104/62 | HR 69 | Temp 98.1°F | Ht 67.5 in | Wt 182.0 lb

## 2019-05-02 DIAGNOSIS — I10 Essential (primary) hypertension: Secondary | ICD-10-CM

## 2019-05-02 DIAGNOSIS — Z0000 Encounter for general adult medical examination without abnormal findings: Secondary | ICD-10-CM

## 2019-05-02 DIAGNOSIS — E559 Vitamin D deficiency, unspecified: Secondary | ICD-10-CM

## 2019-05-02 DIAGNOSIS — E119 Type 2 diabetes mellitus without complications: Secondary | ICD-10-CM | POA: Diagnosis not present

## 2019-05-02 LAB — POC URINALSYSI DIPSTICK (AUTOMATED)
Bilirubin, UA: NEGATIVE
Blood, UA: NEGATIVE
Glucose, UA: POSITIVE — AB
Ketones, UA: NEGATIVE
Leukocytes, UA: NEGATIVE
Nitrite, UA: NEGATIVE
Protein, UA: NEGATIVE
Spec Grav, UA: 1.025 (ref 1.010–1.025)
Urobilinogen, UA: 0.2 E.U./dL
pH, UA: 5 (ref 5.0–8.0)

## 2019-05-02 LAB — CBC WITH DIFFERENTIAL/PLATELET
Basophils Absolute: 0 10*3/uL (ref 0.0–0.1)
Basophils Relative: 0.5 % (ref 0.0–3.0)
Eosinophils Absolute: 0.1 10*3/uL (ref 0.0–0.7)
Eosinophils Relative: 1.8 % (ref 0.0–5.0)
HCT: 41.4 % (ref 36.0–46.0)
Hemoglobin: 13.6 g/dL (ref 12.0–15.0)
Lymphocytes Relative: 26.1 % (ref 12.0–46.0)
Lymphs Abs: 1.5 10*3/uL (ref 0.7–4.0)
MCHC: 32.9 g/dL (ref 30.0–36.0)
MCV: 88.9 fl (ref 78.0–100.0)
Monocytes Absolute: 0.3 10*3/uL (ref 0.1–1.0)
Monocytes Relative: 5.8 % (ref 3.0–12.0)
Neutro Abs: 3.7 10*3/uL (ref 1.4–7.7)
Neutrophils Relative %: 65.8 % (ref 43.0–77.0)
Platelets: 237 10*3/uL (ref 150.0–400.0)
RBC: 4.66 Mil/uL (ref 3.87–5.11)
RDW: 13.2 % (ref 11.5–15.5)
WBC: 5.6 10*3/uL (ref 4.0–10.5)

## 2019-05-02 LAB — HEPATIC FUNCTION PANEL
ALT: 22 U/L (ref 0–35)
AST: 15 U/L (ref 0–37)
Albumin: 4.5 g/dL (ref 3.5–5.2)
Alkaline Phosphatase: 75 U/L (ref 39–117)
Bilirubin, Direct: 0.1 mg/dL (ref 0.0–0.3)
Total Bilirubin: 0.6 mg/dL (ref 0.2–1.2)
Total Protein: 7.1 g/dL (ref 6.0–8.3)

## 2019-05-02 LAB — HEMOGLOBIN A1C: Hgb A1c MFr Bld: 6.6 % — ABNORMAL HIGH (ref 4.6–6.5)

## 2019-05-02 LAB — VITAMIN D 25 HYDROXY (VIT D DEFICIENCY, FRACTURES): VITD: 80.06 ng/mL (ref 30.00–100.00)

## 2019-05-02 LAB — BASIC METABOLIC PANEL
BUN: 17 mg/dL (ref 6–23)
CO2: 29 mEq/L (ref 19–32)
Calcium: 9.3 mg/dL (ref 8.4–10.5)
Chloride: 102 mEq/L (ref 96–112)
Creatinine, Ser: 0.8 mg/dL (ref 0.40–1.20)
GFR: 73.14 mL/min (ref >60.00)
Glucose, Bld: 109 mg/dL — ABNORMAL HIGH (ref 70–99)
Potassium: 4.8 mEq/L (ref 3.5–5.1)
Sodium: 140 mEq/L (ref 135–145)

## 2019-05-02 LAB — LIPID PANEL
Cholesterol: 171 mg/dL (ref 0–200)
HDL: 44.4 mg/dL (ref >39.00)
NonHDL: 126.87
Total CHOL/HDL Ratio: 4
Triglycerides: 206 mg/dL — ABNORMAL HIGH (ref 0.0–149.0)
VLDL: 41.2 mg/dL — ABNORMAL HIGH (ref 0.0–40.0)

## 2019-05-02 LAB — LDL CHOLESTEROL, DIRECT: Direct LDL: 79 mg/dL

## 2019-05-02 LAB — TSH: TSH: 1.03 u[IU]/mL (ref 0.35–4.50)

## 2019-05-02 MED ORDER — BUPROPION HCL ER (XL) 150 MG PO TB24: 150.0000 mg | ORAL_TABLET | Freq: Every day | ORAL | 3 refills | Status: DC

## 2019-05-02 MED ORDER — LOSARTAN POTASSIUM 50 MG PO TABS: 50.0000 mg | ORAL_TABLET | Freq: Every day | ORAL | 3 refills | Status: DC

## 2019-05-02 MED ORDER — ATORVASTATIN CALCIUM 10 MG PO TABS: 10.0000 mg | ORAL_TABLET | Freq: Every day | ORAL | 3 refills | Status: DC

## 2019-05-02 MED ORDER — FARXIGA 10 MG PO TABS: 10.0000 mg | ORAL_TABLET | Freq: Every day | ORAL | 3 refills | Status: DC

## 2019-05-02 MED ORDER — METFORMIN HCL 500 MG PO TABS: ORAL_TABLET | ORAL | 3 refills | Status: DC

## 2019-05-02 MED FILL — ATORVASTATIN 10 MG TABLET: 10 | 90 days supply | Qty: 90 | Fill #0

## 2019-05-02 MED FILL — metFORMIN HCL 500 MG TABS: 500 | 90 days supply | Qty: 270 | Fill #0

## 2019-05-02 MED FILL — LOSARTAN POTASSIUM 50 MG TA: 50 | 30 days supply | Qty: 30 | Fill #0

## 2019-05-02 NOTE — Progress Notes (Signed)
   Subjective:    Patient ID: Tracy Anderson, female    DOB: 1961/04/27, 58 y.o.   MRN: [id number]  HPI Here for a well exam. She feels fine. She has a mammogram and a colonoscopy coming up soon.    Review of Systems  Constitutional: Negative.   HENT: Negative.   Eyes: Negative.   Respiratory: Negative.   Cardiovascular: Negative.   Gastrointestinal: Negative.   Genitourinary: Negative for decreased urine volume, difficulty urinating, dyspareunia, dysuria, enuresis, flank pain, frequency, hematuria, pelvic pain and urgency.  Musculoskeletal: Negative.   Skin: Negative.   Neurological: Negative.   Psychiatric/Behavioral: Negative.        Objective:   Physical Exam Constitutional:      General: She is not in acute distress.    Appearance: She is well-developed.  HENT:     Head: Normocephalic and atraumatic.     Right Ear: External ear normal.     Left Ear: External ear normal.     Nose: Nose normal.     Mouth/Throat:     Pharynx: No oropharyngeal exudate.  Eyes:     General: No scleral icterus.    Conjunctiva/sclera: Conjunctivae normal.     Pupils: Pupils are equal, round, and reactive to light.  Neck:     Musculoskeletal: Normal range of motion and neck supple.     Thyroid: No thyromegaly.     Vascular: No JVD.  Cardiovascular:     Rate and Rhythm: Normal rate and regular rhythm.     Heart sounds: Normal heart sounds. No murmur. No friction rub. No gallop.   Pulmonary:     Effort: Pulmonary effort is normal. No respiratory distress.     Breath sounds: Normal breath sounds. No wheezing or rales.  Chest:     Chest wall: No tenderness.  Abdominal:     General: Bowel sounds are normal. There is no distension.     Palpations: Abdomen is soft. There is no mass.     Tenderness: There is no abdominal tenderness. There is no guarding or rebound.  Musculoskeletal: Normal range of motion.        General: No tenderness.  Lymphadenopathy:     Cervical: No cervical  adenopathy.  Skin:    General: Skin is warm and dry.     Findings: No erythema or rash.  Neurological:     Mental Status: She is alert and oriented to person, place, and time.     Cranial Nerves: No cranial nerve deficit.     Motor: No abnormal muscle tone.     Coordination: Coordination normal.     Deep Tendon Reflexes: Reflexes are normal and symmetric. Reflexes normal.  Psychiatric:        Behavior: Behavior normal.        Thought Content: Thought content normal.        Judgment: Judgment normal.           Assessment & Plan:  Well exam. We discussed diet and exercise. Get fasting labs. Start on Lipitor daily.  Alysia Penna, MD

## 2019-05-07 ENCOUNTER — Encounter: Payer: Self-pay | Admitting: *Deleted

## 2019-05-08 MED FILL — VICTOZA 18 MG/3 ML INJECT P: 18 | 30 days supply | Qty: 9 | Fill #2

## 2019-05-23 ENCOUNTER — Other Ambulatory Visit: Payer: Self-pay

## 2019-05-23 ENCOUNTER — Ambulatory Visit
Admission: RE | Admit: 2019-05-23 | Discharge: 2019-05-23 | Disposition: A | Discharge status: Home / Self Care | Payer: 59 | Source: Ambulatory Visit | Attending: Family Medicine | Admitting: Family Medicine

## 2019-05-23 DIAGNOSIS — Z1231 Encounter for screening mammogram for malignant neoplasm of breast: Secondary | ICD-10-CM | POA: Diagnosis not present

## 2019-06-05 ENCOUNTER — Telehealth (INDEPENDENT_AMBULATORY_CARE_PROVIDER_SITE_OTHER): Payer: 59 | Admitting: Family Medicine

## 2019-06-05 ENCOUNTER — Other Ambulatory Visit: Payer: Self-pay | Admitting: Family Medicine

## 2019-06-05 ENCOUNTER — Other Ambulatory Visit: Payer: Self-pay

## 2019-06-05 ENCOUNTER — Encounter: Payer: Self-pay | Admitting: Family Medicine

## 2019-06-05 DIAGNOSIS — N3001 Acute cystitis with hematuria: Secondary | ICD-10-CM

## 2019-06-05 DIAGNOSIS — E119 Type 2 diabetes mellitus without complications: Secondary | ICD-10-CM

## 2019-06-05 MED ORDER — NITROFURANTOIN MONOHYD MACRO 100 MG PO CAPS: 100.0000 mg | ORAL_CAPSULE | Freq: Two times a day (BID) | ORAL | 0 refills | Status: DC

## 2019-06-05 MED FILL — FARXIGA 10 MG TABLET: 10 | 90 days supply | Qty: 90 | Fill #0

## 2019-06-05 MED FILL — LOSARTAN POTASSIUM 50 MG TA: 50 | 30 days supply | Qty: 30 | Fill #1

## 2019-06-05 MED FILL — VICTOZA 18 MG/3 ML INJECT P: 18 | 30 days supply | Qty: 9 | Fill #3

## 2019-06-05 NOTE — Patient Instructions (Signed)
-I sent the antibiotic (Macrobid/nitrofurantoin) to the pharmacy of your choice. Please take according to the instructions.  I hope you are feeling better soon! Seek care promptly if your symptoms worsen, new concerns arise or you are not improving with treatment.    Urinary Tract Infection, Adult A urinary tract infection (UTI) is an infection of any part of the urinary tract. The urinary tract includes:  The kidneys.  The ureters.  The bladder.  The urethra. These organs make, store, and get rid of pee (urine) in the body. What are the causes? This is caused by germs (bacteria) in your genital area. These germs grow and cause swelling (inflammation) of your urinary tract. What increases the risk? You are more likely to develop this condition if:  You have a small, thin tube (catheter) to drain pee.  You cannot control when you pee or poop (incontinence).  You are female, and: ? You use these methods to prevent pregnancy: ? A medicine that kills sperm (spermicide). ? A device that blocks sperm (diaphragm). ? You have low levels of a female hormone (estrogen). ? You are pregnant.  You have genes that add to your risk.  You are sexually active.  You take antibiotic medicines.  You have trouble peeing because of: ? A prostate that is bigger than normal, if you are female. ? A blockage in the part of your body that drains pee from the bladder (urethra). ? A kidney stone. ? A nerve condition that affects your bladder (neurogenic bladder). ? Not getting enough to drink. ? Not peeing often enough.  You have other conditions, such as: ? Diabetes. ? A weak disease-fighting system (immune system). ? Sickle cell disease. ? Gout. ? Injury of the spine. What are the signs or symptoms? Symptoms of this condition include:  Needing to pee right away (urgently).  Peeing often.  Peeing small amounts often.  Pain or burning when peeing.  Blood in the pee.  Pee that  smells bad or not like normal.  Trouble peeing.  Pee that is cloudy.  Fluid coming from the vagina, if you are female.  Pain in the belly or lower back. Other symptoms include:  Throwing up (vomiting).  No urge to eat.  Feeling mixed up (confused).  Being tired and grouchy (irritable).  A fever.  Watery poop (diarrhea). How is this treated? This condition may be treated with:  Antibiotic medicine.  Other medicines.  Drinking enough water. Follow these instructions at home:  Medicines  Take over-the-counter and prescription medicines only as told by your doctor.  If you were prescribed an antibiotic medicine, take it as told by your doctor. Do not stop taking it even if you start to feel better. General instructions  Make sure you: ? Pee until your bladder is empty. ? Do not hold pee for a long time. ? Empty your bladder after sex. ? Wipe from front to back after pooping if you are a female. Use each tissue one time when you wipe.  Drink enough fluid to keep your pee pale yellow.  Keep all follow-up visits as told by your doctor. This is important. Contact a doctor if:  You do not get better after 1-2 days.  Your symptoms go away and then come back. Get help right away if:  You have very bad back pain.  You have very bad pain in your lower belly.  You have a fever.  You are sick to your stomach (nauseous).  You are  throwing up. Summary  A urinary tract infection (UTI) is an infection of any part of the urinary tract.  This condition is caused by germs in your genital area.  There are many risk factors for a UTI. These include having a small, thin tube to drain pee and not being able to control when you pee or poop.  Treatment includes antibiotic medicines for germs.  Drink enough fluid to keep your pee pale yellow. This information is not intended to replace advice given to you by your health care provider. Make sure you discuss any questions  you have with your health care provider. Document Released: 04/10/2008 Document Revised: 10/10/2018 Document Reviewed: 05/02/2018 Elsevier Patient Education  2020 [employer].

## 2019-06-05 NOTE — Progress Notes (Signed)
Virtual Visit via Video Note  I connected with Tracy Anderson  on 06/05/19 at 12:40 PM EDT by a video enabled telemedicine application and verified that I am speaking with the correct person using two identifiers.  Location patient: home Location provider:work or home office Persons participating in the virtual visit: patient, provider  I discussed the limitations of evaluation and management by telemedicine and the availability of in person appointments. The patient expressed understanding and agreed to proceed.   HPI:  Acute visit for a "UTI": -started yesterday -symptoms include: urgency, frequency, cloudy urine, sm amount of hematuria, dysuria -denies: fevers, flank pain, NVD, vaginal symptoms -has had UTI before and this feel  ROS: See pertinent positives and negatives per HPI.  Past Medical History:  Diagnosis Date  . Arthritis   . Asthma    as a child  . Back pain   . Depression   . Diabetes mellitus type II   . Herpes genitalia   . Hyperlipidemia   . Hypertension   . Shellfish allergy   . Shingles 2012   right flank    Past Surgical History:  Procedure Laterality Date  . CHOLECYSTECTOMY N/A 07/05/2016   Procedure: LAPAROSCOPIC CHOLECYSTECTOMY;  Surgeon: Georganna Skeans, MD;  Location: [facility];  Service: General;  Laterality: N/A;  . colonscopy  05/20/09   per Dr. Deatra Ina, hyperplstic polyps, repeat in 10 yers   . TYMPANOSTOMY TUBE PLACEMENT      Family History  Problem Relation Age of Onset  . Arthritis Other   . Cancer Other        cervical  . Depression Other   . Diabetes Other   . Ulcers Other   . Breast cancer Maternal Grandmother   . Diabetes Mother   . Heart disease Mother   . Cancer Mother   . Depression Mother   . Alcoholism Mother   . Eating disorder Mother   . Diabetes Father   . Hypertension Father   . Depression Father   . Alcoholism Father     SOCIAL HX: see hpi   Current Outpatient Medications:  .  ACCU-CHEK FASTCLIX LANCETS MISC, Use as  directed, Disp: 102 each, Rfl: 6 .  atorvastatin (LIPITOR) 10 MG tablet, Take 1 tablet (10 mg total) by mouth daily., Disp: 90 tablet, Rfl: 3 .  buPROPion (WELLBUTRIN XL) 150 MG 24 hr tablet, Take 1 tablet (150 mg total) by mouth daily., Disp: 90 tablet, Rfl: 3 .  FARXIGA 10 MG TABS tablet, Take 10 mg by mouth daily., Disp: 90 tablet, Rfl: 3 .  glucose blood (ACCU-CHEK GUIDE) test strip, Use 2 X a day and diagnosis code is E 11.9, Disp: 100 each, Rfl: 3 .  Insulin Pen Needle 32G X 4 MM MISC, 1 Package by Does not apply route daily at 12 noon., Disp: 100 each, Rfl: 5 .  liraglutide (VICTOZA) 18 MG/3ML SOPN, Inject 0.3 mLs (1.8 mg total) into the skin every morning., Disp: 10 pen, Rfl: 3 .  losartan (COZAAR) 50 MG tablet, Take 1 tablet (50 mg total) by mouth daily., Disp: 90 tablet, Rfl: 3 .  metFORMIN (GLUCOPHAGE) 500 MG tablet, Take 500mg  in the am by mouth and 1000mg  in the pm daily, Disp: 270 tablet, Rfl: 3 .  Vitamin D, Ergocalciferol, (DRISDOL) 1.25 MG (50000 UT) CAPS capsule, TAKE 1 CAPSULE BY MOUTH EVERY 7 DAYS., Disp: 12 capsule, Rfl: 3 .  nitrofurantoin, macrocrystal-monohydrate, (MACROBID) 100 MG capsule, Take 1 capsule (100 mg total) by mouth 2 (two)  times daily., Disp: 14 capsule, Rfl: 0  EXAM:  VITALS per patient if applicable: denies fever  GENERAL: alert, oriented, appears well and in no acute distress  HEENT: atraumatic, conjunttiva clear, no obvious abnormalities on inspection of external nose and ears  NECK: normal movements of the head and neck  LUNGS: on inspection no signs of respiratory distress, breathing rate appears normal, no obvious gross SOB, gasping or wheezing  CV: no obvious cyanosis  MS: moves all visible extremities without noticeable abnormality  PSYCH/NEURO: pleasant and cooperative, no obvious depression or anxiety, speech and thought processing grossly intact  ASSESSMENT AND PLAN:  Discussed the following assessment and plan:  Acute cystitis with  hematuria   -we discussed possible serious and likely etiologies, workup and treatment, treatment risks and return precautions. Suspect acute cystitis. Discussed limitations of empiric treatment vs in office evaluation with urine studies. -after this discussion, Tracy Anderson opted for: -empiric treatment with Macrobid 100mg  bid x7 days -plenty of water, cranberry if she wishes -Azo if needed -follow up advised if symptoms worsen or persist or new concerns arise.   I discussed the assessment and treatment plan with the patient. The patient was provided an opportunity to ask questions and all were answered. The patient agreed with the plan and demonstrated an understanding of the instructions.    Lucretia Kern, DO    Patient Instructions  -I sent the antibiotic (Macrobid/nitrofurantoin) to the pharmacy of your choice. Please take according to the instructions.  I hope you are feeling better soon! Seek care promptly if your symptoms worsen, new concerns arise or you are not improving with treatment.    Urinary Tract Infection, Adult A urinary tract infection (UTI) is an infection of any part of the urinary tract. The urinary tract includes:  The kidneys.  The ureters.  The bladder.  The urethra. These organs make, store, and get rid of pee (urine) in the body. What are the causes? This is caused by germs (bacteria) in your genital area. These germs grow and cause swelling (inflammation) of your urinary tract. What increases the risk? You are more likely to develop this condition if:  You have a small, thin tube (catheter) to drain pee.  You cannot control when you pee or poop (incontinence).  You are female, and: ? You use these methods to prevent pregnancy: ? A medicine that kills sperm (spermicide). ? A device that blocks sperm (diaphragm). ? You have low levels of a female hormone (estrogen). ? You are pregnant.  You have genes that add to your risk.  You are sexually  active.  You take antibiotic medicines.  You have trouble peeing because of: ? A prostate that is bigger than normal, if you are female. ? A blockage in the part of your body that drains pee from the bladder (urethra). ? A kidney stone. ? A nerve condition that affects your bladder (neurogenic bladder). ? Not getting enough to drink. ? Not peeing often enough.  You have other conditions, such as: ? Diabetes. ? A weak disease-fighting system (immune system). ? Sickle cell disease. ? Gout. ? Injury of the spine. What are the signs or symptoms? Symptoms of this condition include:  Needing to pee right away (urgently).  Peeing often.  Peeing small amounts often.  Pain or burning when peeing.  Blood in the pee.  Pee that smells bad or not like normal.  Trouble peeing.  Pee that is cloudy.  Fluid coming from the vagina, if you  are female.  Pain in the belly or lower back. Other symptoms include:  Throwing up (vomiting).  No urge to eat.  Feeling mixed up (confused).  Being tired and grouchy (irritable).  A fever.  Watery poop (diarrhea). How is this treated? This condition may be treated with:  Antibiotic medicine.  Other medicines.  Drinking enough water. Follow these instructions at home:  Medicines  Take over-the-counter and prescription medicines only as told by your doctor.  If you were prescribed an antibiotic medicine, take it as told by your doctor. Do not stop taking it even if you start to feel better. General instructions  Make sure you: ? Pee until your bladder is empty. ? Do not hold pee for a long time. ? Empty your bladder after sex. ? Wipe from front to back after pooping if you are a female. Use each tissue one time when you wipe.  Drink enough fluid to keep your pee pale yellow.  Keep all follow-up visits as told by your doctor. This is important. Contact a doctor if:  You do not get better after 1-2 days.  Your symptoms go  away and then come back. Get help right away if:  You have very bad back pain.  You have very bad pain in your lower belly.  You have a fever.  You are sick to your stomach (nauseous).  You are throwing up. Summary  A urinary tract infection (UTI) is an infection of any part of the urinary tract.  This condition is caused by germs in your genital area.  There are many risk factors for a UTI. These include having a small, thin tube to drain pee and not being able to control when you pee or poop.  Treatment includes antibiotic medicines for germs.  Drink enough fluid to keep your pee pale yellow. This information is not intended to replace advice given to you by your health care provider. Make sure you discuss any questions you have with your health care provider. Document Released: 04/10/2008 Document Revised: 10/10/2018 Document Reviewed: 05/02/2018 Elsevier Patient Education  2020 [employer].

## 2019-06-06 MED FILL — ACCU-CHEK GUIDE TEST STRIP: 50 days supply | Qty: 100 | Fill #0

## 2019-06-12 MED FILL — ACCU-CHEK FASTCLIX LANCETS: 51 days supply | Qty: 102 | Fill #0

## 2019-06-12 MED FILL — buPROPion HCL ER (XL) 150 M: 150 | 90 days supply | Qty: 90 | Fill #0

## 2019-06-24 DIAGNOSIS — E119 Type 2 diabetes mellitus without complications: Secondary | ICD-10-CM | POA: Diagnosis not present

## 2019-06-24 DIAGNOSIS — H16142 Punctate keratitis, left eye: Secondary | ICD-10-CM | POA: Diagnosis not present

## 2019-06-24 DIAGNOSIS — H40053 Ocular hypertension, bilateral: Secondary | ICD-10-CM | POA: Diagnosis not present

## 2019-06-24 DIAGNOSIS — H04123 Dry eye syndrome of bilateral lacrimal glands: Secondary | ICD-10-CM | POA: Diagnosis not present

## 2019-06-24 MED FILL — ERYTHROMYCIN EYE OINTMENT: 5 | 14 days supply | Qty: 4 | Fill #0

## 2019-07-01 MED FILL — LOSARTAN POTASSIUM 50 MG TA: 50 | 30 days supply | Qty: 30 | Fill #2

## 2019-07-02 ENCOUNTER — Other Ambulatory Visit: Payer: Self-pay | Admitting: *Deleted

## 2019-07-02 DIAGNOSIS — Z20822 Contact with and (suspected) exposure to covid-19: Secondary | ICD-10-CM

## 2019-07-09 MED FILL — VIT D2 1.25 MG (50,000 UNIT: 1.25 MG | 84 days supply | Qty: 12 | Fill #1

## 2019-07-09 MED FILL — VICTOZA 18 MG/3 ML INJECT P: 18 | 30 days supply | Qty: 9 | Fill #4

## 2019-07-30 ENCOUNTER — Other Ambulatory Visit: Payer: Self-pay | Admitting: Family Medicine

## 2019-07-30 DIAGNOSIS — E119 Type 2 diabetes mellitus without complications: Secondary | ICD-10-CM

## 2019-07-30 MED FILL — UNIFINE PENTIPS 32GX5/32: 32G X 4 MM | 90 days supply | Qty: 100 | Fill #0

## 2019-07-30 MED FILL — UNIFINE PENTIPS 32GX5/32": 32G X 4 MM | 90 days supply | Qty: 100 | Fill #0

## 2019-07-30 MED FILL — metFORMIN HCL 500 MG TABS: 500 | 90 days supply | Qty: 270 | Fill #1

## 2019-07-30 MED FILL — ATORVASTATIN 10 MG TABLET: 10 | 90 days supply | Qty: 90 | Fill #1

## 2019-08-10 MED FILL — VICTOZA 18 MG/3 ML INJECT P: 18 | 30 days supply | Qty: 9 | Fill #5

## 2019-08-10 MED FILL — LOSARTAN POTASSIUM 50 MG TA: 50 | 30 days supply | Qty: 30 | Fill #3

## 2019-09-06 MED FILL — buPROPion HCL ER (XL) 150 M: 150 | 90 days supply | Qty: 90 | Fill #1

## 2019-09-06 MED FILL — LOSARTAN POTASSIUM 50 MG TA: 50 | 30 days supply | Qty: 30 | Fill #4

## 2019-09-06 MED FILL — VICTOZA 18 MG/3 ML INJECT P: 18 | 30 days supply | Qty: 9 | Fill #6

## 2019-09-06 MED FILL — FARXIGA 10 MG TABLET: 10 | 90 days supply | Qty: 90 | Fill #1

## 2019-09-16 ENCOUNTER — Other Ambulatory Visit: Payer: Self-pay | Admitting: Family Medicine

## 2019-09-16 ENCOUNTER — Encounter: Payer: Self-pay | Admitting: Family Medicine

## 2019-09-16 MED FILL — ACCU-CHEK GUIDE TEST STRIP: 50 days supply | Qty: 100 | Fill #1

## 2019-09-17 MED FILL — ACCU-CHEK FASTCLIX LANCETS: 50 days supply | Qty: 102 | Fill #0

## 2019-09-18 NOTE — Telephone Encounter (Signed)
There is no evidence to support taking zinc

## 2019-09-18 NOTE — Telephone Encounter (Signed)
Please advise 

## 2019-09-24 ENCOUNTER — Ambulatory Visit (INDEPENDENT_AMBULATORY_CARE_PROVIDER_SITE_OTHER): Payer: 59 | Admitting: Bariatrics

## 2019-09-29 MED FILL — VIT D2 1.25 MG (50,000 UNIT: 1.25 MG | 84 days supply | Qty: 12 | Fill #2

## 2019-10-06 ENCOUNTER — Encounter: Payer: Self-pay | Admitting: Family Medicine

## 2019-10-06 DIAGNOSIS — E119 Type 2 diabetes mellitus without complications: Secondary | ICD-10-CM

## 2019-10-06 MED FILL — LOSARTAN POTASSIUM 50 MG TA: 50 | 30 days supply | Qty: 30 | Fill #5

## 2019-10-07 MED ORDER — FARXIGA 10 MG PO TABS: 10.0000 mg | ORAL_TABLET | Freq: Every day | ORAL | 3 refills | Status: DC

## 2019-10-07 MED ORDER — ACCU-CHEK FASTCLIX LANCETS MISC: 11 refills | Status: DC

## 2019-10-07 MED ORDER — LIRAGLUTIDE 18 MG/3ML ~~LOC~~ SOPN: 1.8000 mg | PEN_INJECTOR | Freq: Every morning | SUBCUTANEOUS | 3 refills | Status: DC

## 2019-10-07 MED ORDER — ACCU-CHEK GUIDE VI STRP: ORAL_STRIP | 3 refills | Status: DC

## 2019-10-07 MED ORDER — UNIFINE PENTIPS 32G X 4 MM MISC: 3 refills | Status: DC

## 2019-10-07 MED FILL — VICTOZA 18 MG/3 ML INJECT P: 18 | 30 days supply | Qty: 9 | Fill #0

## 2019-10-07 NOTE — Telephone Encounter (Signed)
Refills were sent in  °

## 2019-10-07 NOTE — Telephone Encounter (Signed)
Ok to refill these orders?

## 2019-10-15 ENCOUNTER — Encounter: Payer: Self-pay | Admitting: Family Medicine

## 2019-10-15 DIAGNOSIS — E119 Type 2 diabetes mellitus without complications: Secondary | ICD-10-CM

## 2019-10-16 ENCOUNTER — Encounter: Payer: Self-pay | Admitting: Family Medicine

## 2019-10-16 NOTE — Telephone Encounter (Signed)
Pt wants to know if she can have her medication filled before the year is up due to insurance change. Pt wants to know if her Diabetic supplies sig can stated pt takes 3 times a day so that she can get a larger qty. Pt has been scheduled for f/u diabetic visit and wants to know if PAP can be done since she has already had her physical

## 2019-10-16 NOTE — Telephone Encounter (Signed)
Please call in refills for the lancets and test strips to test TID  (so 270 at a time). I already refilled the Victoza and the pen tips on 10-07-19. I cannot give her early refills on medications because her insurance will not cover it

## 2019-10-17 MED ORDER — ACCU-CHEK FASTCLIX LANCETS MISC: 11 refills | Status: DC

## 2019-10-17 MED ORDER — ACCU-CHEK GUIDE VI STRP: ORAL_STRIP | 3 refills | Status: DC

## 2019-10-26 MED FILL — ACCU-CHEK GUIDE TEST STRIP: 83 days supply | Qty: 250 | Fill #0

## 2019-10-27 MED FILL — ACCU-CHEK FASTCLIX LANCETS: 90 days supply | Qty: 306 | Fill #0

## 2019-10-28 MED FILL — metFORMIN HCL 500 MG TABS: 500 | 90 days supply | Qty: 270 | Fill #2

## 2019-10-28 MED FILL — ATORVASTATIN 10 MG TABLET: 10 | 90 days supply | Qty: 90 | Fill #2

## 2019-11-03 ENCOUNTER — Telehealth (INDEPENDENT_AMBULATORY_CARE_PROVIDER_SITE_OTHER): Payer: 59 | Admitting: Family Medicine

## 2019-11-03 ENCOUNTER — Other Ambulatory Visit: Payer: Self-pay

## 2019-11-03 ENCOUNTER — Encounter: Payer: Self-pay | Admitting: Family Medicine

## 2019-11-03 DIAGNOSIS — E119 Type 2 diabetes mellitus without complications: Secondary | ICD-10-CM

## 2019-11-03 DIAGNOSIS — I10 Essential (primary) hypertension: Secondary | ICD-10-CM

## 2019-11-03 NOTE — Progress Notes (Signed)
Virtual Visit via Video Note  I connected with the patient on 11/03/19 at  9:30 AM EST by a video enabled telemedicine application and verified that I am speaking with the correct person using two identifiers.  Location patient: home Location provider:work or home office Persons participating in the virtual visit: patient, provider  I discussed the limitations of evaluation and management by telemedicine and the availability of in person appointments. The patient expressed understanding and agreed to proceed.   HPI: Here to follow up on diabetes and HTN. She feels great. Her BP has been stable at home. She admits to some dietary indiscretion lately over the holidays. Her fasting glucoses have been running from 111 to 179.    ROS: See pertinent positives and negatives per HPI.  Past Medical History:  Diagnosis Date  . Arthritis   . Asthma    as a child  . Back pain   . Depression   . Diabetes mellitus type II   . Herpes genitalia   . Hyperlipidemia   . Hypertension   . Shellfish allergy   . Shingles 2012   right flank    Past Surgical History:  Procedure Laterality Date  . CHOLECYSTECTOMY N/A 07/05/2016   Procedure: LAPAROSCOPIC CHOLECYSTECTOMY;  Surgeon: Georganna Skeans, MD;  Location: [facility];  Service: General;  Laterality: N/A;  . colonscopy  05/20/09   per Dr. Deatra Ina, hyperplstic polyps, repeat in 10 yers   . TYMPANOSTOMY TUBE PLACEMENT      Family History  Problem Relation Age of Onset  . Arthritis Other   . Cancer Other        cervical  . Depression Other   . Diabetes Other   . Ulcers Other   . Breast cancer Maternal Grandmother   . Diabetes Mother   . Heart disease Mother   . Cancer Mother   . Depression Mother   . Alcoholism Mother   . Eating disorder Mother   . Diabetes Father   . Hypertension Father   . Depression Father   . Alcoholism Father      Current Outpatient Medications:  .  Accu-Chek FastClix Lancets MISC, Use to check blood sugar TID,  Disp: 270 each, Rfl: 11 .  atorvastatin (LIPITOR) 10 MG tablet, Take 1 tablet (10 mg total) by mouth daily., Disp: 90 tablet, Rfl: 3 .  buPROPion (WELLBUTRIN XL) 150 MG 24 hr tablet, Take 1 tablet (150 mg total) by mouth daily., Disp: 90 tablet, Rfl: 3 .  FARXIGA 10 MG TABS tablet, Take 10 mg by mouth daily., Disp: 90 tablet, Rfl: 3 .  glucose blood (ACCU-CHEK GUIDE) test strip, Used to test blood sugar TID, Disp: 270 strip, Rfl: 3 .  Insulin Pen Needle (UNIFINE PENTIPS) 32G X 4 MM MISC, USE WITH INSULIN AT 12 NOON, Disp: 100 each, Rfl: 3 .  liraglutide (VICTOZA) 18 MG/3ML SOPN, Inject 0.3 mLs (1.8 mg total) into the skin every morning., Disp: 10 pen, Rfl: 3 .  losartan (COZAAR) 50 MG tablet, Take 1 tablet (50 mg total) by mouth daily., Disp: 90 tablet, Rfl: 3 .  metFORMIN (GLUCOPHAGE) 500 MG tablet, Take 500mg  in the am by mouth and 1000mg  in the pm daily, Disp: 270 tablet, Rfl: 3 .  nitrofurantoin, macrocrystal-monohydrate, (MACROBID) 100 MG capsule, Take 1 capsule (100 mg total) by mouth 2 (two) times daily., Disp: 14 capsule, Rfl: 0 .  Vitamin D, Ergocalciferol, (DRISDOL) 1.25 MG (50000 UT) CAPS capsule, TAKE 1 CAPSULE BY MOUTH EVERY 7 DAYS.,  Disp: 12 capsule, Rfl: 3  EXAM:  VITALS per patient if applicable:  GENERAL: alert, oriented, appears well and in no acute distress  HEENT: atraumatic, conjunttiva clear, no obvious abnormalities on inspection of external nose and ears  NECK: normal movements of the head and neck  LUNGS: on inspection no signs of respiratory distress, breathing rate appears normal, no obvious gross SOB, gasping or wheezing  CV: no obvious cyanosis  MS: moves all visible extremities without noticeable abnormality  PSYCH/NEURO: pleasant and cooperative, no obvious depression or anxiety, speech and thought processing grossly intact  ASSESSMENT AND PLAN: Her HTN is stable. To check the diabetes, she will schedule an A1c at the lab this week.  Tracy Penna,  MD  Discussed the following assessment and plan:  Type 2 diabetes mellitus without complication, without long-term current use of insulin ([place]) - Plan: A1C/Hgb A1C (Glycohemoglobin)  Essential hypertension     I discussed the assessment and treatment plan with the patient. The patient was provided an opportunity to ask questions and all were answered. The patient agreed with the plan and demonstrated an understanding of the instructions.   The patient was advised to call back or seek an in-person evaluation if the symptoms worsen or if the condition fails to improve as anticipated.

## 2019-11-05 ENCOUNTER — Other Ambulatory Visit: Payer: Self-pay

## 2019-11-05 ENCOUNTER — Other Ambulatory Visit (INDEPENDENT_AMBULATORY_CARE_PROVIDER_SITE_OTHER): Payer: 59

## 2019-11-05 DIAGNOSIS — E119 Type 2 diabetes mellitus without complications: Secondary | ICD-10-CM

## 2019-11-05 LAB — HEMOGLOBIN A1C: Hgb A1c MFr Bld: 7.2 % — ABNORMAL HIGH (ref 4.6–6.5)

## 2019-11-11 MED FILL — LOSARTAN POTASSIUM 50 MG TA: 50 | 30 days supply | Qty: 30 | Fill #6

## 2019-11-11 MED FILL — VICTOZA 18 MG/3 ML INJECT P: 18 | 30 days supply | Qty: 9 | Fill #1

## 2019-11-17 MED FILL — UNIFINE PENTIPS 32GX5/32: 32G X 4 MM | 90 days supply | Qty: 100 | Fill #1

## 2019-12-04 MED FILL — FARXIGA 10 MG TABLET: 10 | 90 days supply | Qty: 90 | Fill #2

## 2019-12-08 MED FILL — VIT D2 1.25 MG (50,000 UNIT: 1.25 MG | 84 days supply | Qty: 12 | Fill #3

## 2019-12-08 MED FILL — buPROPion HCL ER (XL) 150 M: 150 | 90 days supply | Qty: 90 | Fill #2

## 2019-12-08 MED FILL — LOSARTAN POTASSIUM 50 MG TA: 50 | 30 days supply | Qty: 30 | Fill #7

## 2019-12-15 MED FILL — VICTOZA 18 MG/3 ML INJECT P: 18 | 30 days supply | Qty: 9 | Fill #0

## 2020-01-12 ENCOUNTER — Other Ambulatory Visit: Payer: Self-pay | Admitting: Family Medicine

## 2020-01-12 DIAGNOSIS — E559 Vitamin D deficiency, unspecified: Secondary | ICD-10-CM

## 2020-01-12 MED FILL — LOSARTAN POTASSIUM 50 MG TA: 50 | 90 days supply | Qty: 90 | Fill #8

## 2020-01-12 MED FILL — VICTOZA 18 MG/3 ML INJECT P: 18 | 30 days supply | Qty: 9 | Fill #1

## 2020-01-23 ENCOUNTER — Encounter: Payer: Self-pay | Admitting: Gastroenterology

## 2020-01-26 MED FILL — ATORVASTATIN 10 MG TABLET: 10 | 90 days supply | Qty: 90 | Fill #3

## 2020-01-26 MED FILL — METFORMIN HCL 500 MG TABS: 500 | 90 days supply | Qty: 270 | Fill #3

## 2020-02-12 ENCOUNTER — Other Ambulatory Visit: Payer: Self-pay

## 2020-02-12 ENCOUNTER — Ambulatory Visit (AMBULATORY_SURGERY_CENTER): Payer: Self-pay | Admitting: *Deleted

## 2020-02-12 VITALS — Temp 97.0°F | Ht 67.5 in | Wt 187.0 lb

## 2020-02-12 DIAGNOSIS — Z86010 Personal history of colonic polyps: Secondary | ICD-10-CM

## 2020-02-12 DIAGNOSIS — Z1211 Encounter for screening for malignant neoplasm of colon: Secondary | ICD-10-CM

## 2020-02-12 MED ORDER — NA SULFATE-K SULFATE-MG SULF 17.5-3.13-1.6 GM/177ML PO SOLN: 1.0000 | Freq: Once | ORAL | 0 refills | Status: AC

## 2020-02-12 MED FILL — SUPREP BOWEL PREP KIT: 17.5-3.13-1 | 1 days supply | Qty: 354 | Fill #0

## 2020-02-12 NOTE — Progress Notes (Signed)

## 2020-02-13 MED FILL — VICTOZA 18 MG/3 ML INJECT P: 18 | 30 days supply | Qty: 9 | Fill #2

## 2020-02-22 MED FILL — UNIFINE PENTIPS 32GX5/32: 32G X 4 MM | 90 days supply | Qty: 100 | Fill #2

## 2020-02-25 ENCOUNTER — Encounter: Payer: Self-pay | Admitting: Gastroenterology

## 2020-02-26 ENCOUNTER — Other Ambulatory Visit: Payer: Self-pay

## 2020-02-26 ENCOUNTER — Ambulatory Visit (AMBULATORY_SURGERY_CENTER): Payer: 59 | Admitting: Gastroenterology

## 2020-02-26 ENCOUNTER — Encounter: Payer: Self-pay | Admitting: Gastroenterology

## 2020-02-26 VITALS — BP 110/77 | HR 64 | Temp 95.5°F | Resp 18 | Ht 67.5 in | Wt 184.0 lb

## 2020-02-26 DIAGNOSIS — D123 Benign neoplasm of transverse colon: Secondary | ICD-10-CM

## 2020-02-26 DIAGNOSIS — D127 Benign neoplasm of rectosigmoid junction: Secondary | ICD-10-CM | POA: Diagnosis not present

## 2020-02-26 DIAGNOSIS — Z86010 Personal history of colonic polyps: Secondary | ICD-10-CM | POA: Diagnosis not present

## 2020-02-26 DIAGNOSIS — Z1211 Encounter for screening for malignant neoplasm of colon: Secondary | ICD-10-CM | POA: Diagnosis not present

## 2020-02-26 HISTORY — PX: OTHER SURGICAL HISTORY: SHX169

## 2020-02-26 MED ORDER — SODIUM CHLORIDE 0.9 % IV SOLN: 500.0000 mL | Freq: Once | INTRAVENOUS | Status: DC

## 2020-02-26 NOTE — Progress Notes (Signed)
To PACU, VSS. Report to [email or web address] 

## 2020-02-26 NOTE — Progress Notes (Addendum)
Pt's states no medical or surgical changes since previsit or office visit.  Temp JB, VS CW   Pt drank 8oz water this am with last sip at 0940.  Dr. Silverio Decamp and CRNA aware.

## 2020-02-26 NOTE — Op Note (Addendum)
[facility] Patient Name: Tracy Anderson Procedure Date: 02/26/2020 12:09 PM MRN: [id number] Endoscopist: Mauri Pole , MD Age: 59 Referring MD:  Date of Birth: 06/30/1961 Gender: Female Account #: [id number] Procedure:                Colonoscopy Indications:              Screening for colorectal malignant neoplasm Medicines:                Monitored Anesthesia Care Procedure:                Pre-Anesthesia Assessment:                           - Prior to the procedure, a History and Physical                            was performed, and patient medications and                            allergies were reviewed. The patient's tolerance of                            previous anesthesia was also reviewed. The risks                            and benefits of the procedure and the sedation                            options and risks were discussed with the patient.                            All questions were answered, and informed consent                            was obtained. Prior Anticoagulants: The patient has                            taken no previous anticoagulant or antiplatelet                            agents. ASA Grade Assessment: III - A patient with                            severe systemic disease. After reviewing the risks                            and benefits, the patient was deemed in                            satisfactory condition to undergo the procedure.                           After obtaining informed consent, the colonoscope  was passed under direct vision. Throughout the                            procedure, the patient's blood pressure, pulse, and                            oxygen saturations were monitored continuously. The                            Colonoscope was introduced through the anus and                            advanced to the the cecum, identified by                            appendiceal  orifice and ileocecal valve. The                            colonoscopy was performed without difficulty. The                            patient tolerated the procedure well. The quality                            of the bowel preparation was good. The ileocecal                            valve, appendiceal orifice, and rectum were                            photographed. Scope In: 12:19:16 PM Scope Out: 12:39:43 PM Scope Withdrawal Time: 0 hours 15 minutes 49 seconds  Total Procedure Duration: 0 hours 20 minutes 27 seconds  Findings:                 The perianal and digital rectal examinations were                            normal.                           Three sessile polyps were found in the                            recto-sigmoid colon and transverse colon. The                            polyps were 3 to 5 mm in size. These polyps were                            removed with a cold snare. Resection and retrieval                            were complete.  Non-bleeding internal hemorrhoids were found during                            retroflexion. The hemorrhoids were small.                           The exam was otherwise without abnormality. Complications:            No immediate complications. Estimated Blood Loss:     Estimated blood loss was minimal. Impression:               - Three 3 to 5 mm polyps at the recto-sigmoid colon                            and in the transverse colon, removed with a cold                            snare. Resected and retrieved.                           - Non-bleeding internal hemorrhoids.                           - The examination was otherwise normal. Recommendation:           - Patient has a contact number available for                            emergencies. The signs and symptoms of potential                            delayed complications were discussed with the                            patient. Return to  normal activities tomorrow.                            Written discharge instructions were provided to the                            patient.                           - Resume previous diet.                           - Continue present medications.                           - Await pathology results.                           - Repeat colonoscopy in 3 - 5 years for                            surveillance based on pathology results. Mauri Pole, MD 02/26/2020 12:46:09 PM This report  has been signed electronically.

## 2020-02-26 NOTE — Progress Notes (Signed)
Called to room to assist during endoscopic procedure.  Patient ID and intended procedure confirmed with present staff. Received instructions for my participation in the procedure from the performing physician.  

## 2020-02-26 NOTE — Patient Instructions (Signed)
Handouts on polyps and hemorrhoids given to you today  Await pathology results    YOU HAD AN ENDOSCOPIC PROCEDURE TODAY AT THE [facility]:   Refer to the procedure report that was given to you for any specific questions about what was found during the examination.  If the procedure report does not answer your questions, please call your gastroenterologist to clarify.  If you requested that your care partner not be given the details of your procedure findings, then the procedure report has been included in a sealed envelope for you to review at your convenience later.  YOU SHOULD EXPECT: Some feelings of bloating in the abdomen. Passage of more gas than usual.  Walking can help get rid of the air that was put into your GI tract during the procedure and reduce the bloating. If you had a lower endoscopy (such as a colonoscopy or flexible sigmoidoscopy) you may notice spotting of blood in your stool or on the toilet paper. If you underwent a bowel prep for your procedure, you may not have a normal bowel movement for a few days.  Please Note:  You might notice some irritation and congestion in your nose or some drainage.  This is from the oxygen used during your procedure.  There is no need for concern and it should clear up in a day or so.  SYMPTOMS TO REPORT IMMEDIATELY:   Following lower endoscopy (colonoscopy or flexible sigmoidoscopy):  Excessive amounts of blood in the stool  Significant tenderness or worsening of abdominal pains  Swelling of the abdomen that is new, acute  Fever of 100F or higher  For urgent or emergent issues, a gastroenterologist can be reached at any hour by calling [phone]. Do not use MyChart messaging for urgent concerns.    DIET:  We do recommend a small meal at first, but then you may proceed to your regular diet.  Drink plenty of fluids but you should avoid alcoholic beverages for 24 hours.  ACTIVITY:  You should plan to take it easy for the  rest of today and you should NOT DRIVE or use heavy machinery until tomorrow (because of the sedation medicines used during the test).    FOLLOW UP: Our staff will call the number listed on your records 48-72 hours following your procedure to check on you and address any questions or concerns that you may have regarding the information given to you following your procedure. If we do not reach you, we will leave a message.  We will attempt to reach you two times.  During this call, we will ask if you have developed any symptoms of COVID 19. If you develop any symptoms (ie: fever, flu-like symptoms, shortness of breath, cough etc.) before then, please call [phone].  If you test positive for Covid 19 in the 2 weeks post procedure, please call and report this information to us.    If any biopsies were taken you will be contacted by phone or by letter within the next 1-3 weeks.  Please call us at [phone] if you have not heard about the biopsies in 3 weeks.    SIGNATURES/CONFIDENTIALITY: You and/or your care partner have signed paperwork which will be entered into your electronic medical record.  These signatures attest to the fact that that the information above on your After Visit Summary has been reviewed and is understood.  Full responsibility of the confidentiality of this discharge information lies with you and/or your care-partner. 

## 2020-02-27 ENCOUNTER — Encounter: Payer: 59 | Admitting: Gastroenterology

## 2020-03-01 ENCOUNTER — Telehealth: Payer: Self-pay | Admitting: *Deleted

## 2020-03-01 NOTE — Telephone Encounter (Signed)
Follow up call made, left message. 

## 2020-03-01 NOTE — Telephone Encounter (Signed)
  Follow up Call-  Call back number 02/26/2020  Post procedure Call Back phone  # [phone]  Permission to leave phone message Yes  Some recent data might be hidden     Patient questions:  Do you have a fever, pain , or abdominal swelling? No. Pain Score  0 *  Have you tolerated food without any problems? Yes.    Have you been able to return to your normal activities? Yes.    Do you have any questions about your discharge instructions: Diet   No. Medications  No. Follow up visit  No.  Do you have questions or concerns about your Care? No.  Actions: * If pain score is 4 or above: No action needed, pain <4.  1. Have you developed a fever since your procedure? no  2.   Have you had an respiratory symptoms (SOB or cough) since your procedure? no  3.   Have you tested positive for COVID 19 since your procedure no  4.   Have you had any family members/close contacts diagnosed with the COVID 19 since your procedure?  no   If yes to any of these questions please route to Joylene John, RN and Erenest Rasher, RN

## 2020-03-02 ENCOUNTER — Encounter: Payer: Self-pay | Admitting: Gastroenterology

## 2020-03-12 MED FILL — buPROPion HCL ER (XL) 150 M: 150 | 90 days supply | Qty: 90 | Fill #3

## 2020-03-12 MED FILL — FARXIGA 10 MG TABLET: 10 | 90 days supply | Qty: 90 | Fill #3

## 2020-03-19 ENCOUNTER — Ambulatory Visit: Payer: 59 | Admitting: Physician Assistant

## 2020-03-19 ENCOUNTER — Other Ambulatory Visit: Payer: Self-pay

## 2020-03-19 ENCOUNTER — Encounter: Payer: Self-pay | Admitting: Physician Assistant

## 2020-03-19 DIAGNOSIS — L821 Other seborrheic keratosis: Secondary | ICD-10-CM

## 2020-03-19 DIAGNOSIS — D485 Neoplasm of uncertain behavior of skin: Secondary | ICD-10-CM

## 2020-03-19 DIAGNOSIS — L578 Other skin changes due to chronic exposure to nonionizing radiation: Secondary | ICD-10-CM | POA: Diagnosis not present

## 2020-03-19 DIAGNOSIS — Z85828 Personal history of other malignant neoplasm of skin: Secondary | ICD-10-CM | POA: Diagnosis not present

## 2020-03-19 DIAGNOSIS — Z86018 Personal history of other benign neoplasm: Secondary | ICD-10-CM | POA: Diagnosis not present

## 2020-03-19 DIAGNOSIS — Z1283 Encounter for screening for malignant neoplasm of skin: Secondary | ICD-10-CM | POA: Diagnosis not present

## 2020-03-19 DIAGNOSIS — D229 Melanocytic nevi, unspecified: Secondary | ICD-10-CM

## 2020-03-19 DIAGNOSIS — D1801 Hemangioma of skin and subcutaneous tissue: Secondary | ICD-10-CM

## 2020-03-19 DIAGNOSIS — D2272 Melanocytic nevi of left lower limb, including hip: Secondary | ICD-10-CM | POA: Diagnosis not present

## 2020-03-19 DIAGNOSIS — L814 Other melanin hyperpigmentation: Secondary | ICD-10-CM

## 2020-03-19 NOTE — Patient Instructions (Signed)

## 2020-03-19 NOTE — Progress Notes (Signed)
   Follow-Up Visit   Subjective  Tracy Anderson is a 59 y.o. female who presents for the following: Annual Exam (Here this morning for yearly skin check. Has noticed a tiny, dark spot on her right calf. No changes in since she first noticed it. ).  The following portions of the chart were reviewed this encounter and updated as appropriate: Tobacco  Allergies  Meds  Problems  Med Hx  Surg Hx  Fam Hx      Objective  Well appearing patient in no apparent distress; mood and affect are within normal limits.  A full examination was performed including scalp, head, eyes, ears, nose, lips, neck, chest, axillae, abdomen, back, buttocks, bilateral upper extremities, bilateral lower extremities, hands, feet, fingers, toes, fingernails, and toenails. All findings within normal limits unless otherwise noted below.  Objective  Mid Tip of Nose: Scars clear  Objective  Left Thigh - Anterior: All scars clear  Objective  Left Thigh - Anterior: Bichromic dark nested macule.      Objective  Head - to Toe: No signs NMSC  Assessment & Plan  History of basal cell carcinoma (BCC) Mid Tip of Nose  clear  History of dysplastic nevus Left Thigh - Anterior  observe  Neoplasm of uncertain behavior of skin Left Thigh - Anterior  Skin / nail biopsy Type of biopsy: tangential   Informed consent: discussed and consent obtained   Timeout: patient name, date of birth, surgical site, and procedure verified   Anesthesia: the lesion was anesthetized in a standard fashion   Anesthetic:  1% lidocaine w/ epinephrine 1-100,000 local infiltration Instrument used: flexible razor blade   Hemostasis achieved with: aluminum chloride and electrodesiccation   Outcome: patient tolerated procedure well   Post-procedure details: wound care instructions given    Specimen 1 - Surgical pathology Differential Diagnosis: DN Check Margins: Yes  Screening exam for skin cancer Head - to Toe  Yearly skin  exam Lentigines - Scattered tan macules - Discussed due to sun exposure - Benign, observe - Call for any changes  Seborrheic Keratoses - Stuck-on, waxy, tan-brown papules and plaques  - Discussed benign etiology and prognosis. - Observe - Call for any changes  Melanocytic Nevi - Tan-brown and/or pink-flesh-colored symmetric macules and papules - Benign appearing on exam today - Observation - Call clinic for new or changing moles - Recommend daily use of broad spectrum spf 30+ sunscreen to sun-exposed areas.   Hemangiomas - Red papules - Discussed benign nature - Observe - Call for any changes  Actinic Damage - diffuse scaly erythematous macules with underlying dyspigmentation - Recommend daily broad spectrum sunscreen SPF 30+ to sun-exposed areas, reapply every 2 hours as needed.  - Call for new or changing lesions.

## 2020-04-19 ENCOUNTER — Other Ambulatory Visit: Payer: Self-pay | Admitting: Family Medicine

## 2020-04-19 MED FILL — VICTOZA 18 MG/3 ML INJECT P: 18 | 30 days supply | Qty: 9 | Fill #4

## 2020-04-19 MED FILL — LOSARTAN POTASSIUM 50 MG TA: 50 | 30 days supply | Qty: 30 | Fill #9

## 2020-04-19 MED FILL — METFORMIN HCL 500 MG TABS: 500 | 90 days supply | Qty: 270 | Fill #0

## 2020-04-19 MED FILL — ATORVASTATIN CALCIUM 10 MG: 10 | 90 days supply | Qty: 90 | Fill #0

## 2020-04-23 ENCOUNTER — Other Ambulatory Visit: Payer: Self-pay | Admitting: Family Medicine

## 2020-04-23 DIAGNOSIS — Z1231 Encounter for screening mammogram for malignant neoplasm of breast: Secondary | ICD-10-CM

## 2020-05-07 ENCOUNTER — Encounter: Payer: 59 | Admitting: Family Medicine

## 2020-05-10 ENCOUNTER — Other Ambulatory Visit: Payer: Self-pay | Admitting: Family Medicine

## 2020-05-10 DIAGNOSIS — I10 Essential (primary) hypertension: Secondary | ICD-10-CM

## 2020-05-13 MED FILL — LOSARTAN POTASSIUM 50 MG TA: 50 | 90 days supply | Qty: 90 | Fill #0

## 2020-05-17 MED FILL — VICTOZA 18 MG/3 ML INJECT P: 18 | 30 days supply | Qty: 9 | Fill #5

## 2020-05-28 ENCOUNTER — Ambulatory Visit
Admission: RE | Admit: 2020-05-28 | Discharge: 2020-05-28 | Disposition: A | Discharge status: Home / Self Care | Payer: 59 | Source: Ambulatory Visit | Attending: Family Medicine | Admitting: Family Medicine

## 2020-05-28 ENCOUNTER — Other Ambulatory Visit: Payer: Self-pay

## 2020-05-28 DIAGNOSIS — Z1231 Encounter for screening mammogram for malignant neoplasm of breast: Secondary | ICD-10-CM | POA: Diagnosis not present

## 2020-06-04 ENCOUNTER — Encounter: Payer: 59 | Admitting: Family Medicine

## 2020-06-09 ENCOUNTER — Other Ambulatory Visit: Payer: Self-pay | Admitting: Family Medicine

## 2020-06-09 DIAGNOSIS — E119 Type 2 diabetes mellitus without complications: Secondary | ICD-10-CM

## 2020-06-09 MED FILL — UNIFINE PENTIPS 32GX5/32: 32G X 4 MM | 90 days supply | Qty: 100 | Fill #3

## 2020-06-09 MED FILL — buPROPion HCL ER (XL) 150 M: 150 | 90 days supply | Qty: 90 | Fill #0

## 2020-06-09 MED FILL — VIT D2 1.25 MG (50,000 UNIT: 1.25 MG | 84 days supply | Qty: 12 | Fill #1

## 2020-06-09 MED FILL — FARXIGA 10 MG TABLET: 10 | 90 days supply | Qty: 90 | Fill #0

## 2020-06-11 ENCOUNTER — Other Ambulatory Visit: Payer: Self-pay

## 2020-06-11 ENCOUNTER — Encounter: Payer: Self-pay | Admitting: Family Medicine

## 2020-06-11 ENCOUNTER — Ambulatory Visit (INDEPENDENT_AMBULATORY_CARE_PROVIDER_SITE_OTHER): Payer: 59 | Admitting: Family Medicine

## 2020-06-11 VITALS — BP 120/82 | HR 73 | Temp 97.7°F | Ht 67.13 in | Wt 187.4 lb

## 2020-06-11 DIAGNOSIS — E119 Type 2 diabetes mellitus without complications: Secondary | ICD-10-CM

## 2020-06-11 DIAGNOSIS — Z0000 Encounter for general adult medical examination without abnormal findings: Secondary | ICD-10-CM | POA: Diagnosis not present

## 2020-06-11 MED ORDER — PHENTERMINE HCL 37.5 MG PO CAPS
37.5000 mg | ORAL_CAPSULE | ORAL | 1 refills | Status: DC
Start: 2020-06-11 — End: 2021-07-01

## 2020-06-11 MED FILL — PHENTERMINE 37.5 MG CAPSULE: 37.5 | 90 days supply | Qty: 90 | Fill #0

## 2020-06-11 NOTE — Progress Notes (Signed)
   Subjective:    Patient ID: Tracy Anderson, female    DOB: 03-30-1961, 59 y.o.   MRN: [id number]  HPI Here for a well exam. She is doing well.    Review of Systems  Constitutional: Negative.   HENT: Negative.   Eyes: Negative.   Respiratory: Negative.   Cardiovascular: Negative.   Gastrointestinal: Negative.   Genitourinary: Negative for decreased urine volume, difficulty urinating, dyspareunia, dysuria, enuresis, flank pain, frequency, hematuria, pelvic pain and urgency.  Musculoskeletal: Negative.   Skin: Negative.   Neurological: Negative.   Psychiatric/Behavioral: Negative.        Objective:   Physical Exam Constitutional:      General: She is not in acute distress.    Appearance: She is well-developed.  HENT:     Head: Normocephalic and atraumatic.     Right Ear: External ear normal.     Left Ear: External ear normal.     Nose: Nose normal.     Mouth/Throat:     Pharynx: No oropharyngeal exudate.  Eyes:     General: No scleral icterus.    Conjunctiva/sclera: Conjunctivae normal.     Pupils: Pupils are equal, round, and reactive to light.  Neck:     Thyroid: No thyromegaly.     Vascular: No JVD.  Cardiovascular:     Rate and Rhythm: Normal rate and regular rhythm.     Heart sounds: Normal heart sounds. No murmur heard.  No friction rub. No gallop.   Pulmonary:     Effort: Pulmonary effort is normal. No respiratory distress.     Breath sounds: Normal breath sounds. No wheezing or rales.  Chest:     Chest wall: No tenderness.  Abdominal:     General: Bowel sounds are normal. There is no distension.     Palpations: Abdomen is soft. There is no mass.     Tenderness: There is no abdominal tenderness. There is no guarding or rebound.  Musculoskeletal:        General: No tenderness. Normal range of motion.     Cervical back: Normal range of motion and neck supple.  Lymphadenopathy:     Cervical: No cervical adenopathy.  Skin:    General: Skin is  warm and dry.     Findings: No erythema or rash.  Neurological:     Mental Status: She is alert and oriented to person, place, and time.     Cranial Nerves: No cranial nerve deficit.     Motor: No abnormal muscle tone.     Coordination: Coordination normal.     Deep Tendon Reflexes: Reflexes are normal and symmetric. Reflexes normal.  Psychiatric:        Behavior: Behavior normal.        Thought Content: Thought content normal.        Judgment: Judgment normal.           Assessment & Plan:  Well exam. We discussed diet and exercise. Get fasting labs.  Alysia Penna, MD

## 2020-06-12 LAB — URINALYSIS
Bilirubin Urine: NEGATIVE
Ketones, ur: NEGATIVE
Nitrite: NEGATIVE
Protein, ur: NEGATIVE
Specific Gravity, Urine: 1.011 (ref 1.001–1.03)
pH: 5.5 (ref 5.0–8.0)

## 2020-06-12 LAB — CBC WITH DIFFERENTIAL/PLATELET
Absolute Monocytes: 405 cells/uL (ref 200–950)
Basophils Absolute: 21 cells/uL (ref 0–200)
Basophils Relative: 0.3 %
Eosinophils Absolute: 92 cells/uL (ref 15–500)
Eosinophils Relative: 1.3 %
HCT: 38.2 % (ref 35.0–45.0)
Hemoglobin: 13 g/dL (ref 11.7–15.5)
Lymphs Abs: 1811 cells/uL (ref 850–3900)
MCH: 29 pg (ref 27.0–33.0)
MCHC: 34 g/dL (ref 32.0–36.0)
MCV: 85.3 fL (ref 80.0–100.0)
MPV: 10.5 fL (ref 7.5–12.5)
Monocytes Relative: 5.7 %
Neutro Abs: 4771 cells/uL (ref 1500–7800)
Neutrophils Relative %: 67.2 %
Platelets: 232 10*3/uL (ref 140–400)
RBC: 4.48 10*6/uL (ref 3.80–5.10)
RDW: 12.3 % (ref 11.0–15.0)
Total Lymphocyte: 25.5 %
WBC: 7.1 10*3/uL (ref 3.8–10.8)

## 2020-06-12 LAB — HEPATIC FUNCTION PANEL
AG Ratio: 2.1 (calc) (ref 1.0–2.5)
ALT: 22 U/L (ref 6–29)
AST: 14 U/L (ref 10–35)
Albumin: 4.9 g/dL (ref 3.6–5.1)
Alkaline phosphatase (APISO): 85 U/L (ref 37–153)
Bilirubin, Direct: 0.1 mg/dL (ref 0.0–0.2)
Globulin: 2.3 g/dL (calc) (ref 1.9–3.7)
Indirect Bilirubin: 0.5 mg/dL (calc) (ref 0.2–1.2)
Total Bilirubin: 0.6 mg/dL (ref 0.2–1.2)
Total Protein: 7.2 g/dL (ref 6.1–8.1)

## 2020-06-12 LAB — HEMOGLOBIN A1C
Hgb A1c MFr Bld: 7.2 % of total Hgb — ABNORMAL HIGH (ref <5.7)
Mean Plasma Glucose: 160 (calc)
eAG (mmol/L): 8.9 (calc)

## 2020-06-12 LAB — BASIC METABOLIC PANEL
BUN: 19 mg/dL (ref 7–25)
CO2: 22 mmol/L (ref 20–32)
Calcium: 9.4 mg/dL (ref 8.6–10.4)
Chloride: 104 mmol/L (ref 98–110)
Creat: 0.73 mg/dL (ref 0.50–0.99)
Glucose, Bld: 102 mg/dL — ABNORMAL HIGH (ref 65–99)
Potassium: 4 mmol/L (ref 3.5–5.3)
Sodium: 139 mmol/L (ref 135–146)

## 2020-06-12 LAB — LIPID PANEL
Cholesterol: 123 mg/dL (ref <200)
HDL: 45 mg/dL — ABNORMAL LOW (ref >=50)
LDL Cholesterol (Calc): 51 mg/dL (calc)
Non-HDL Cholesterol (Calc): 78 mg/dL (calc) (ref <130)
Total CHOL/HDL Ratio: 2.7 (calc) (ref <5.0)
Triglycerides: 207 mg/dL — ABNORMAL HIGH (ref <150)

## 2020-06-12 LAB — TSH: TSH: 0.95 mIU/L (ref 0.40–4.50)

## 2020-06-22 MED FILL — VICTOZA 18 MG/3 ML INJECT P: 18 | 30 days supply | Qty: 9 | Fill #6

## 2020-07-26 ENCOUNTER — Other Ambulatory Visit: Payer: Self-pay | Admitting: Family Medicine

## 2020-07-26 MED FILL — VICTOZA 18 MG/3 ML INJECT P: 18 | 30 days supply | Qty: 9 | Fill #7

## 2020-07-26 MED FILL — METFORMIN HCL 500 MG TABS: 500 | 90 days supply | Qty: 270 | Fill #0

## 2020-07-26 MED FILL — ATORVASTATIN CALCIUM 10 MG: 10 | 90 days supply | Qty: 90 | Fill #0

## 2020-08-05 ENCOUNTER — Encounter: Payer: Self-pay | Admitting: Family Medicine

## 2020-08-06 ENCOUNTER — Other Ambulatory Visit: Payer: Self-pay | Admitting: Nurse Practitioner

## 2020-08-06 ENCOUNTER — Ambulatory Visit (HOSPITAL_COMMUNITY)
Admission: RE | Admit: 2020-08-06 | Discharge: 2020-08-06 | Disposition: A | Discharge status: Home / Self Care | Payer: 59 | Source: Ambulatory Visit | Attending: Pulmonary Disease | Admitting: Pulmonary Disease

## 2020-08-06 DIAGNOSIS — U071 COVID-19: Secondary | ICD-10-CM | POA: Diagnosis not present

## 2020-08-06 MED ORDER — EPINEPHRINE 0.3 MG/0.3ML IJ SOAJ: 0.3000 mg | Freq: Once | INTRAMUSCULAR | Status: DC | PRN

## 2020-08-06 MED ORDER — FAMOTIDINE IN NACL 20-0.9 MG/50ML-% IV SOLN: 20.0000 mg | Freq: Once | INTRAVENOUS | Status: DC | PRN

## 2020-08-06 MED ORDER — METHYLPREDNISOLONE SODIUM SUCC 125 MG IJ SOLR: 125.0000 mg | Freq: Once | INTRAMUSCULAR | Status: DC | PRN

## 2020-08-06 MED ORDER — DIPHENHYDRAMINE HCL 50 MG/ML IJ SOLN: 50.0000 mg | Freq: Once | INTRAMUSCULAR | Status: DC | PRN

## 2020-08-06 MED ORDER — ALBUTEROL SULFATE HFA 108 (90 BASE) MCG/ACT IN AERS: 2.0000 | INHALATION_SPRAY | Freq: Once | RESPIRATORY_TRACT | Status: DC | PRN

## 2020-08-06 MED ORDER — SODIUM CHLORIDE 0.9 % IV SOLN: INTRAVENOUS | Status: DC | PRN

## 2020-08-06 MED ORDER — SODIUM CHLORIDE 0.9 % IV SOLN
1200.0000 mg | Freq: Once | INTRAVENOUS | Status: AC
  Administered 2020-08-06: 1200 mg via INTRAVENOUS

## 2020-08-06 NOTE — Progress Notes (Signed)
I connected by phone with Tracy Anderson on 08/06/2020 at 10:00 AM to discuss the potential use of an new treatment for mild to moderate COVID-19 viral infection in non-hospitalized patients.  This patient is a 59 y.o. female that meets the FDA criteria for Emergency Use Authorization of casirivimab\imdevimab.  Has a (+) direct SARS-CoV-2 viral test result  Has mild or moderate COVID-19   Is ? 61 years of age and weighs ? 40 kg  Is NOT hospitalized due to COVID-19  Is NOT requiring oxygen therapy or requiring an increase in baseline oxygen flow rate due to COVID-19  Is within 10 days of symptom onset  Has at least one of the high risk factor(s) for progression to severe COVID-19 and/or hospitalization as defined in EUA.  Specific high risk criteria : BMI > 25, Diabetes and Cardiovascular disease or hypertension   I have spoken and communicated the following to the patient or parent/caregiver:  1. FDA has authorized the emergency use of casirivimab\imdevimab for the treatment of mild to moderate COVID-19 in adults and pediatric patients with positive results of direct SARS-CoV-2 viral testing who are 67 years of age and older weighing at least 40 kg, and who are at high risk for progressing to severe COVID-19 and/or hospitalization.  2. The significant known and potential risks and benefits of casirivimab\imdevimab, and the extent to which such potential risks and benefits are unknown.  3. Information on available alternative treatments and the risks and benefits of those alternatives, including clinical trials.  4. Patients treated with casirivimab\imdevimab should continue to self-isolate and use infection control measures (e.g., wear mask, isolate, social distance, avoid sharing personal items, clean and disinfect "high touch" surfaces, and frequent handwashing) according to CDC guidelines.   5. The patient or parent/caregiver has the option to accept or refuse  casirivimab\imdevimab .  After reviewing this information with the patient, the patient has agreed to receive one of the available covid 19 monoclonal antibodies and will be provided an appropriate fact sheet prior to infusion.Beckey Rutter, [place], AGNP-C [phone] ([place])

## 2020-08-06 NOTE — Telephone Encounter (Signed)
She should go to an Urgent Care. She may be developing a pneumonia. She likely will need steroids and an antibiotic.

## 2020-08-06 NOTE — Progress Notes (Signed)
  Diagnosis: COVID-19  Physician: Dr. Asencion Noble  Procedure: Covid Infusion Clinic Med: casirivimab\imdevimab infusion - Provided patient with casirivimab\imdevimab fact sheet for patients, parents and caregivers prior to infusion.  Complications: No immediate complications noted.  Discharge: Discharged home   Tracy Anderson 08/06/2020

## 2020-08-06 NOTE — Discharge Instructions (Signed)

## 2020-08-10 NOTE — Telephone Encounter (Signed)
Noted  

## 2020-08-11 ENCOUNTER — Telehealth: Payer: 59 | Admitting: Family

## 2020-08-11 ENCOUNTER — Other Ambulatory Visit: Payer: Self-pay | Admitting: Family

## 2020-08-11 DIAGNOSIS — J45909 Unspecified asthma, uncomplicated: Secondary | ICD-10-CM

## 2020-08-11 DIAGNOSIS — U071 COVID-19: Secondary | ICD-10-CM | POA: Diagnosis not present

## 2020-08-11 MED ORDER — BENZONATATE 100 MG PO CAPS
100.0000 mg | ORAL_CAPSULE | Freq: Three times a day (TID) | ORAL | 0 refills | Status: DC | PRN
Start: 2020-08-11 — End: 2020-08-11

## 2020-08-11 MED ORDER — ALBUTEROL SULFATE HFA 108 (90 BASE) MCG/ACT IN AERS
2.0000 | INHALATION_SPRAY | Freq: Four times a day (QID) | RESPIRATORY_TRACT | 0 refills | Status: DC | PRN
Start: 2020-08-11 — End: 2020-08-11

## 2020-08-11 MED ORDER — DEXAMETHASONE 6 MG PO TABS
6.0000 mg | ORAL_TABLET | Freq: Every day | ORAL | 0 refills | Status: DC
Start: 2020-08-11 — End: 2020-08-11

## 2020-08-11 MED FILL — DEXAMETHASONE 6 MG TABLET: 6 | 5 days supply | Qty: 5 | Fill #0

## 2020-08-11 MED FILL — ALBUTEROL SULFATE HFA 108 (: 108 (90 BAS | 16 days supply | Qty: 18 | Fill #0

## 2020-08-11 MED FILL — BENZONATATE 100 MG CAPS: 100 | 6 days supply | Qty: 20 | Fill #0

## 2020-08-11 NOTE — Progress Notes (Signed)
E-Visit for Corona Virus Screening  We are sorry you are not feeling well. We are here to help!  You have tested positive for COVID-19, meaning that you were infected with the novel coronavirus and could give the germ to others.    You have been enrolled in [place] for COVID-19. Daily you will receive a questionnaire within the [facility] website. Our COVID-19 response team will be monitoring your responses daily.  Please continue isolation at home, for at least 10 days since the start of your symptoms and until you have had 24 hours with no fever (without taking a fever reducer) and with improving of symptoms.  Please continue good preventive care measures, including:  frequent hand-washing, avoid touching your face, cover coughs/sneezes, stay out of crowds and keep a 6 foot distance from others.  Follow up with your provider or go to the nearest hospital ED for re-assessment if fever/cough/breathlessness return.  The following symptoms may appear 2-14 days after exposure: . Fever . Cough . Shortness of breath or difficulty breathing . Chills . Repeated shaking with chills . Muscle pain . Headache . Sore throat . New loss of taste or smell . Fatigue . Congestion or runny nose . Nausea or vomiting . Diarrhea  Go to the nearest hospital ED for assessment if fever/cough/breathlessness are severe or illness seems like a threat to life.  It is vitally important that if you feel that you have an infection such as this virus or any other virus that you stay home and away from places where you may spread it to others.  You should avoid contact with people age 61 and older.   You can use medication such as A prescription cough medication called Tessalon Perles 100 mg. You may take 1-2 capsules every 8 hours as needed for cough and A prescription inhaler called Albuterol MDI 90 mcg /actuation 2 puffs every 4 hours as needed for shortness of breath, wheezing, cough, and dexamethasone  6 mg for 5 days.    You may also take acetaminophen (Tylenol) as needed for fever.  Reduce your risk of any infection by using the same precautions used for avoiding the common cold or flu:  Marland Kitchen Wash your hands often with soap and warm water for at least 20 seconds.  If soap and water are not readily available, use an alcohol-based hand sanitizer with at least 60% alcohol.  . If coughing or sneezing, cover your mouth and nose by coughing or sneezing into the elbow areas of your shirt or coat, into a tissue or into your sleeve (not your hands). . Avoid shaking hands with others and consider head nods or verbal greetings only. . Avoid touching your eyes, nose, or mouth with unwashed hands.  . Avoid close contact with people who are sick. . Avoid places or events with large numbers of people in one location, like concerts or sporting events. . Carefully consider travel plans you have or are making. . If you are planning any travel outside or inside the [place], visit the CDC's Travelers' Health webpage for the latest health notices. . If you have some symptoms but not all symptoms, continue to monitor at home and seek medical attention if your symptoms worsen. . If you are having a medical emergency, call 911.  HOME CARE . Only take medications as instructed by your medical team. . Drink plenty of fluids and get plenty of rest. . A steam or ultrasonic humidifier can help if you have congestion.  GET HELP RIGHT AWAY IF YOU HAVE EMERGENCY WARNING SIGNS** FOR COVID-19. If you or someone is showing any of these signs seek emergency medical care immediately. Call 911 or proceed to your closest emergency facility if: . You develop worsening high fever. . Trouble breathing . Bluish lips or face . Persistent pain or pressure in the chest . New confusion . Inability to wake or stay awake . You cough up blood. . Your symptoms become more severe  **This list is not all possible symptoms. Contact your  medical provider for any symptoms that are sever or concerning to you.  MAKE SURE YOU   Understand these instructions.  Will watch your condition.  Will get help right away if you are not doing well or get worse.  Your e-visit answers were reviewed by a board certified advanced clinical practitioner to complete your personal care plan.  Depending on the condition, your plan could have included both over the counter or prescription medications.  If there is a problem please reply once you have received a response from your provider.  Your safety is important to [place].  If you have drug allergies check your prescription carefully.    You can use MyChart to ask questions about today's visit, request a non-urgent call back, or ask for a work or school excuse for 24 hours related to this e-Visit. If it has been greater than 24 hours you will need to follow up with your provider, or enter a new e-Visit to address those concerns. You will get an e-mail in the next two days asking about your experience.  I hope that your e-visit has been valuable and will speed your recovery. Thank you for using e-visits.   Approximately 5 minutes was spent documenting and reviewing patient's chart.

## 2020-08-23 ENCOUNTER — Other Ambulatory Visit: Payer: Self-pay

## 2020-08-23 DIAGNOSIS — I10 Essential (primary) hypertension: Secondary | ICD-10-CM

## 2020-08-23 MED ORDER — LOSARTAN POTASSIUM 50 MG PO TABS: 50.0000 mg | ORAL_TABLET | Freq: Every day | ORAL | 1 refills | Status: DC

## 2020-08-23 MED FILL — VICTOZA 18 MG/3 ML INJECT P: 18 | 30 days supply | Qty: 9 | Fill #8

## 2020-08-23 MED FILL — LOSARTAN POTASSIUM 50 MG TA: 50 | 30 days supply | Qty: 30 | Fill #1

## 2020-09-13 MED FILL — UNIFINE PENTIPS 32GX5/32: 32G X 4 MM | 90 days supply | Qty: 100 | Fill #0

## 2020-09-13 MED FILL — FARXIGA 10 MG TABLET: 10 | 90 days supply | Qty: 90 | Fill #1

## 2020-09-14 ENCOUNTER — Encounter: Payer: Self-pay | Admitting: Family Medicine

## 2020-09-15 ENCOUNTER — Other Ambulatory Visit: Payer: Self-pay

## 2020-09-15 ENCOUNTER — Other Ambulatory Visit: Payer: Self-pay | Admitting: Family Medicine

## 2020-09-15 DIAGNOSIS — E119 Type 2 diabetes mellitus without complications: Secondary | ICD-10-CM

## 2020-09-15 MED ORDER — UNIFINE PENTIPS 32G X 4 MM MISC: 3 refills | Status: DC

## 2020-09-22 ENCOUNTER — Other Ambulatory Visit: Payer: Self-pay | Admitting: Family Medicine

## 2020-09-22 DIAGNOSIS — I10 Essential (primary) hypertension: Secondary | ICD-10-CM

## 2020-09-22 MED FILL — LOSARTAN POTASSIUM 50 MG TA: 50 | 30 days supply | Qty: 30 | Fill #0

## 2020-09-28 MED FILL — VICTOZA 18 MG/3 ML INJECT P: 18 | 30 days supply | Qty: 9 | Fill #9

## 2020-10-08 ENCOUNTER — Ambulatory Visit: Payer: 59 | Admitting: Family Medicine

## 2020-10-25 MED FILL — LOSARTAN POTASSIUM 50 MG TA: 50 | 30 days supply | Qty: 30 | Fill #1

## 2020-11-05 ENCOUNTER — Other Ambulatory Visit: Payer: Self-pay | Admitting: Family Medicine

## 2020-11-05 DIAGNOSIS — E119 Type 2 diabetes mellitus without complications: Secondary | ICD-10-CM

## 2020-11-09 ENCOUNTER — Other Ambulatory Visit: Payer: Self-pay | Admitting: Family Medicine

## 2020-11-10 ENCOUNTER — Other Ambulatory Visit: Payer: Self-pay | Admitting: Family Medicine

## 2020-11-10 MED FILL — VICTOZA 18 MG/3 ML INJECT P: 18 | 30 days supply | Qty: 9 | Fill #0

## 2020-11-10 MED FILL — METFORMIN HCL 500 MG TABS: 500 | 90 days supply | Qty: 270 | Fill #0

## 2020-11-12 ENCOUNTER — Ambulatory Visit: Payer: 59 | Attending: Internal Medicine

## 2020-11-12 DIAGNOSIS — H04123 Dry eye syndrome of bilateral lacrimal glands: Secondary | ICD-10-CM | POA: Diagnosis not present

## 2020-11-12 DIAGNOSIS — Z23 Encounter for immunization: Secondary | ICD-10-CM

## 2020-11-12 DIAGNOSIS — H40053 Ocular hypertension, bilateral: Secondary | ICD-10-CM | POA: Diagnosis not present

## 2020-11-12 DIAGNOSIS — H17821 Peripheral opacity of cornea, right eye: Secondary | ICD-10-CM | POA: Diagnosis not present

## 2020-11-12 DIAGNOSIS — E119 Type 2 diabetes mellitus without complications: Secondary | ICD-10-CM

## 2020-11-12 NOTE — Progress Notes (Signed)
   Covid-19 Vaccination Clinic  Name:  Tracy Anderson    MRN: [id number] DOB: [birth year]  11/12/2020  Tracy Anderson was observed post Covid-19 immunization for 15 minutes without incident. She was provided with Vaccine Information Sheet and instruction to access the V-Safe system.   Tracy Anderson was instructed to call 911 with any severe reactions post vaccine: Marland Kitchen Difficulty breathing  . Swelling of face and throat  . A fast heartbeat  . A bad rash all over body  . Dizziness and weakness   Immunizations Administered    Name Date Dose VIS Date Route   Pfizer COVID-19 Vaccine 11/12/2020  5:57 PM 0.3 mL 08/25/2020 Intramuscular   Manufacturer: [place]   Lot: [id number]   Circle: [id number]

## 2020-11-13 ENCOUNTER — Ambulatory Visit: Payer: 59

## 2020-11-15 ENCOUNTER — Other Ambulatory Visit: Payer: Self-pay | Admitting: Family Medicine

## 2020-11-15 MED FILL — ATORVASTATIN CALCIUM 10 MG: 10 | 90 days supply | Qty: 90 | Fill #0

## 2020-11-29 MED FILL — LOSARTAN POTASSIUM 50 MG TA: 50 | 30 days supply | Qty: 30 | Fill #2

## 2020-12-06 ENCOUNTER — Other Ambulatory Visit: Payer: Self-pay | Admitting: Family Medicine

## 2020-12-06 DIAGNOSIS — E119 Type 2 diabetes mellitus without complications: Secondary | ICD-10-CM

## 2020-12-06 MED FILL — VICTOZA 18 MG/3 ML INJECT P: 18 | 30 days supply | Qty: 9 | Fill #0

## 2020-12-06 MED FILL — VIT D2 1.25 MG (50,000 UNIT: 1.25 MG | 84 days supply | Qty: 12 | Fill #3

## 2020-12-17 DIAGNOSIS — H5213 Myopia, bilateral: Secondary | ICD-10-CM | POA: Diagnosis not present

## 2020-12-17 DIAGNOSIS — H52223 Regular astigmatism, bilateral: Secondary | ICD-10-CM | POA: Diagnosis not present

## 2020-12-17 DIAGNOSIS — H524 Presbyopia: Secondary | ICD-10-CM | POA: Diagnosis not present

## 2020-12-22 MED FILL — FARXIGA 10 MG TABLET: 10 | 90 days supply | Qty: 90 | Fill #2

## 2020-12-22 MED FILL — buPROPion HCL ER (XL) 150 M: 150 | 90 days supply | Qty: 90 | Fill #2

## 2020-12-23 MED FILL — LOSARTAN POTASSIUM 50 MG TA: 50 | 30 days supply | Qty: 30 | Fill #3

## 2021-01-05 MED FILL — UNIFINE PENTIPS 32GX5/32: 32G X 4 MM | 90 days supply | Qty: 100 | Fill #0

## 2021-01-05 MED FILL — VICTOZA 18 MG/3 ML INJECT P: 18 | 30 days supply | Qty: 9 | Fill #1

## 2021-01-07 ENCOUNTER — Other Ambulatory Visit: Payer: Self-pay

## 2021-01-07 DIAGNOSIS — E119 Type 2 diabetes mellitus without complications: Secondary | ICD-10-CM

## 2021-01-07 MED ORDER — UNIFINE PENTIPS 32G X 4 MM MISC: 3 refills | Status: DC

## 2021-01-25 ENCOUNTER — Other Ambulatory Visit (HOSPITAL_BASED_OUTPATIENT_CLINIC_OR_DEPARTMENT_OTHER): Payer: Self-pay

## 2021-02-03 ENCOUNTER — Other Ambulatory Visit: Payer: Self-pay | Admitting: Family Medicine

## 2021-02-03 DIAGNOSIS — I10 Essential (primary) hypertension: Secondary | ICD-10-CM

## 2021-02-03 MED FILL — LOSARTAN POTASSIUM 50 MG TA: 50 | 30 days supply | Qty: 30 | Fill #0

## 2021-02-09 ENCOUNTER — Other Ambulatory Visit (HOSPITAL_COMMUNITY): Payer: Self-pay

## 2021-02-09 MED FILL — Liraglutide Soln Pen-injector 18 MG/3ML (6 MG/ML): SUBCUTANEOUS | 30 days supply | Qty: 9 | Fill #0 | Status: AC

## 2021-02-15 ENCOUNTER — Other Ambulatory Visit: Payer: Self-pay | Admitting: Family Medicine

## 2021-02-15 ENCOUNTER — Other Ambulatory Visit (HOSPITAL_COMMUNITY): Payer: Self-pay

## 2021-02-15 MED ORDER — METFORMIN HCL 500 MG PO TABS
ORAL_TABLET | ORAL | 0 refills | Status: DC
  Filled 2021-02-15: qty 270, 90d supply, fill #0

## 2021-02-22 ENCOUNTER — Other Ambulatory Visit: Payer: Self-pay | Admitting: Family Medicine

## 2021-02-22 ENCOUNTER — Other Ambulatory Visit (HOSPITAL_COMMUNITY): Payer: Self-pay

## 2021-02-22 MED ORDER — ATORVASTATIN CALCIUM 10 MG PO TABS
10.0000 mg | ORAL_TABLET | Freq: Every day | ORAL | 0 refills | Status: DC
  Filled 2021-02-22: qty 90, 90d supply, fill #0

## 2021-02-22 MED FILL — Losartan Potassium Tab 50 MG: ORAL | 30 days supply | Qty: 30 | Fill #0 | Status: CN

## 2021-02-22 MED FILL — Losartan Potassium Tab 50 MG: ORAL | 30 days supply | Qty: 30 | Fill #0 | Status: AC

## 2021-02-28 ENCOUNTER — Other Ambulatory Visit (HOSPITAL_COMMUNITY): Payer: Self-pay

## 2021-03-06 ENCOUNTER — Other Ambulatory Visit: Payer: Self-pay | Admitting: Family Medicine

## 2021-03-06 DIAGNOSIS — E559 Vitamin D deficiency, unspecified: Secondary | ICD-10-CM

## 2021-03-07 ENCOUNTER — Other Ambulatory Visit (HOSPITAL_COMMUNITY): Payer: Self-pay

## 2021-03-08 ENCOUNTER — Other Ambulatory Visit (HOSPITAL_COMMUNITY): Payer: Self-pay

## 2021-03-08 ENCOUNTER — Other Ambulatory Visit: Payer: Self-pay | Admitting: Family Medicine

## 2021-03-08 DIAGNOSIS — E559 Vitamin D deficiency, unspecified: Secondary | ICD-10-CM

## 2021-03-08 MED ORDER — VITAMIN D (ERGOCALCIFEROL) 1.25 MG (50000 UNIT) PO CAPS
ORAL_CAPSULE | ORAL | 3 refills | Status: AC
Start: 2021-03-08 — End: 2022-03-08
  Filled 2021-03-09: qty 12, 84d supply, fill #0
  Filled 2021-05-30: qty 12, 84d supply, fill #1
  Filled 2021-08-29: qty 12, 84d supply, fill #2
  Filled 2021-11-27: qty 12, 84d supply, fill #3

## 2021-03-08 NOTE — Telephone Encounter (Signed)
Last office visit- 11/12/20  Can this patient receive a refill?

## 2021-03-09 ENCOUNTER — Other Ambulatory Visit: Payer: Self-pay | Admitting: Family Medicine

## 2021-03-09 ENCOUNTER — Other Ambulatory Visit (HOSPITAL_COMMUNITY): Payer: Self-pay

## 2021-03-09 DIAGNOSIS — E119 Type 2 diabetes mellitus without complications: Secondary | ICD-10-CM

## 2021-03-09 MED ORDER — VICTOZA 18 MG/3ML ~~LOC~~ SOPN
PEN_INJECTOR | SUBCUTANEOUS | 2 refills | Status: DC
  Filled 2021-03-09: qty 9, 30d supply, fill #0
  Filled 2021-04-10: qty 9, 30d supply, fill #1
  Filled 2021-05-12: qty 9, 30d supply, fill #2

## 2021-03-27 MED FILL — Losartan Potassium Tab 50 MG: ORAL | 30 days supply | Qty: 30 | Fill #1 | Status: AC

## 2021-03-27 MED FILL — Dapagliflozin Propanediol Tab 10 MG (Base Equivalent): ORAL | 90 days supply | Qty: 90 | Fill #0 | Status: AC

## 2021-03-27 MED FILL — Bupropion HCl Tab ER 24HR 150 MG: ORAL | 90 days supply | Qty: 90 | Fill #0 | Status: AC

## 2021-03-28 ENCOUNTER — Other Ambulatory Visit (HOSPITAL_COMMUNITY): Payer: Self-pay

## 2021-03-29 ENCOUNTER — Other Ambulatory Visit (HOSPITAL_COMMUNITY): Payer: Self-pay

## 2021-04-06 ENCOUNTER — Ambulatory Visit: Payer: 59 | Admitting: Physician Assistant

## 2021-04-11 ENCOUNTER — Other Ambulatory Visit (HOSPITAL_COMMUNITY): Payer: Self-pay

## 2021-04-15 ENCOUNTER — Other Ambulatory Visit: Payer: Self-pay | Admitting: Family Medicine

## 2021-04-15 DIAGNOSIS — Z1231 Encounter for screening mammogram for malignant neoplasm of breast: Secondary | ICD-10-CM

## 2021-04-20 ENCOUNTER — Ambulatory Visit: Payer: 59 | Admitting: Physician Assistant

## 2021-04-20 ENCOUNTER — Other Ambulatory Visit (HOSPITAL_COMMUNITY): Payer: Self-pay

## 2021-04-20 MED FILL — Insulin Pen Needle 32 G X 4 MM (1/6" or 5/32"): 90 days supply | Qty: 100 | Fill #0 | Status: AC

## 2021-04-21 ENCOUNTER — Other Ambulatory Visit (HOSPITAL_COMMUNITY): Payer: Self-pay

## 2021-05-05 ENCOUNTER — Encounter: Payer: Self-pay | Admitting: Family Medicine

## 2021-05-05 ENCOUNTER — Other Ambulatory Visit (HOSPITAL_COMMUNITY): Payer: Self-pay

## 2021-05-05 MED FILL — Losartan Potassium Tab 50 MG: ORAL | 30 days supply | Qty: 30 | Fill #2 | Status: AC

## 2021-05-06 ENCOUNTER — Other Ambulatory Visit (HOSPITAL_COMMUNITY): Payer: Self-pay

## 2021-05-12 ENCOUNTER — Other Ambulatory Visit (HOSPITAL_COMMUNITY): Payer: Self-pay

## 2021-05-19 ENCOUNTER — Other Ambulatory Visit: Payer: Self-pay | Admitting: Family Medicine

## 2021-05-19 ENCOUNTER — Other Ambulatory Visit (HOSPITAL_COMMUNITY): Payer: Self-pay

## 2021-05-19 MED ORDER — METFORMIN HCL 500 MG PO TABS
ORAL_TABLET | ORAL | 0 refills | Status: DC
Start: 2021-05-19 — End: 2021-06-19
  Filled 2021-05-19: qty 90, 30d supply, fill #0

## 2021-05-20 ENCOUNTER — Other Ambulatory Visit (HOSPITAL_COMMUNITY): Payer: Self-pay

## 2021-05-30 ENCOUNTER — Other Ambulatory Visit (HOSPITAL_COMMUNITY): Payer: Self-pay

## 2021-05-30 ENCOUNTER — Other Ambulatory Visit: Payer: Self-pay | Admitting: Family Medicine

## 2021-05-31 ENCOUNTER — Other Ambulatory Visit (HOSPITAL_COMMUNITY): Payer: Self-pay

## 2021-06-01 ENCOUNTER — Encounter: Payer: Self-pay | Admitting: Physician Assistant

## 2021-06-01 ENCOUNTER — Other Ambulatory Visit (HOSPITAL_COMMUNITY): Payer: Self-pay

## 2021-06-01 ENCOUNTER — Ambulatory Visit: Payer: 59 | Admitting: Physician Assistant

## 2021-06-01 ENCOUNTER — Other Ambulatory Visit: Payer: Self-pay

## 2021-06-01 DIAGNOSIS — D1801 Hemangioma of skin and subcutaneous tissue: Secondary | ICD-10-CM

## 2021-06-01 DIAGNOSIS — B360 Pityriasis versicolor: Secondary | ICD-10-CM

## 2021-06-01 DIAGNOSIS — Z85828 Personal history of other malignant neoplasm of skin: Secondary | ICD-10-CM

## 2021-06-01 DIAGNOSIS — Z1283 Encounter for screening for malignant neoplasm of skin: Secondary | ICD-10-CM

## 2021-06-01 MED ORDER — ATORVASTATIN CALCIUM 10 MG PO TABS
10.0000 mg | ORAL_TABLET | Freq: Every day | ORAL | 3 refills | Status: DC
  Filled 2021-06-01: qty 90, 90d supply, fill #0
  Filled 2021-08-29: qty 90, 90d supply, fill #1
  Filled 2021-12-05: qty 90, 90d supply, fill #2
  Filled 2022-04-07: qty 90, 90d supply, fill #3

## 2021-06-01 NOTE — Progress Notes (Signed)
   Follow-Up Visit   Subjective  Tracy Anderson is a 60 y.o. female who presents for the following: Annual Exam (Here for annual skin exam. Concerns small dark area on right calf. Patient unknown of time frame. Did get scaly and bleed. Also has milia on left eyelid. ).   The following portions of the chart were reviewed this encounter and updated as appropriate:  Tobacco  Allergies  Meds  Problems  Med Hx  Surg Hx  Fam Hx      Objective  Well appearing patient in no apparent distress; mood and affect are within normal limits.  A full examination was performed including scalp, head, eyes, ears, nose, lips, neck, chest, axillae, abdomen, back, buttocks, bilateral upper extremities, bilateral lower extremities, hands, feet, fingers, toes, fingernails, and toenails. All findings within normal limits unless otherwise noted below.  Right Tip of Nose Right nose scar clear no atypical moles   Left Breast, Left Upper Back, Neck - Anterior, Right Breast, Right Upper Back clear  Right Lower Leg - Posterior Dark, lobulated with small amount of crusted heme.  Assessment & Plan  History of basal cell carcinoma (BCC) Right Tip of Nose  Yearly skin exams  Tinea versicolor Neck - Anterior; Left Breast; Right Breast; Left Upper Back; Right Upper Back  Call if recurs  Hemangioma of skin Right Lower Leg - Posterior  observe   I, Nolton Denis, PA-C, have reviewed all documentation's for this visit.  The documentation on 06/01/21 for the exam, diagnosis, procedures and orders are all accurate and complete.

## 2021-06-07 ENCOUNTER — Other Ambulatory Visit: Payer: Self-pay | Admitting: Family Medicine

## 2021-06-07 DIAGNOSIS — I10 Essential (primary) hypertension: Secondary | ICD-10-CM

## 2021-06-08 ENCOUNTER — Other Ambulatory Visit (HOSPITAL_COMMUNITY): Payer: Self-pay

## 2021-06-08 MED ORDER — LOSARTAN POTASSIUM 50 MG PO TABS
ORAL_TABLET | Freq: Every day | ORAL | 0 refills | Status: DC
  Filled 2021-06-08: qty 30, 30d supply, fill #0

## 2021-06-10 ENCOUNTER — Encounter: Payer: Self-pay | Admitting: Family Medicine

## 2021-06-10 ENCOUNTER — Ambulatory Visit
Admission: RE | Admit: 2021-06-10 | Discharge: 2021-06-10 | Disposition: A | Discharge status: Home / Self Care | Payer: 59 | Source: Ambulatory Visit | Attending: Family Medicine | Admitting: Family Medicine

## 2021-06-10 ENCOUNTER — Other Ambulatory Visit: Payer: Self-pay

## 2021-06-10 DIAGNOSIS — Z1231 Encounter for screening mammogram for malignant neoplasm of breast: Secondary | ICD-10-CM

## 2021-06-10 NOTE — Telephone Encounter (Signed)
We do NOT give Covid vaccines here

## 2021-06-17 DIAGNOSIS — H04123 Dry eye syndrome of bilateral lacrimal glands: Secondary | ICD-10-CM | POA: Diagnosis not present

## 2021-06-17 DIAGNOSIS — H40053 Ocular hypertension, bilateral: Secondary | ICD-10-CM | POA: Diagnosis not present

## 2021-06-19 ENCOUNTER — Other Ambulatory Visit: Payer: Self-pay | Admitting: Family Medicine

## 2021-06-19 DIAGNOSIS — E119 Type 2 diabetes mellitus without complications: Secondary | ICD-10-CM

## 2021-06-20 ENCOUNTER — Other Ambulatory Visit (HOSPITAL_COMMUNITY): Payer: Self-pay

## 2021-06-20 MED ORDER — VICTOZA 18 MG/3ML ~~LOC~~ SOPN
PEN_INJECTOR | SUBCUTANEOUS | 2 refills | Status: DC
  Filled 2021-06-20: qty 9, 30d supply, fill #0

## 2021-06-20 MED ORDER — METFORMIN HCL 500 MG PO TABS
ORAL_TABLET | ORAL | 0 refills | Status: DC
  Filled 2021-06-20: qty 90, 30d supply, fill #0

## 2021-06-21 ENCOUNTER — Other Ambulatory Visit (HOSPITAL_COMMUNITY): Payer: Self-pay

## 2021-06-22 ENCOUNTER — Other Ambulatory Visit (HOSPITAL_COMMUNITY): Payer: Self-pay

## 2021-06-24 ENCOUNTER — Encounter: Payer: 59 | Admitting: Family Medicine

## 2021-06-29 ENCOUNTER — Other Ambulatory Visit: Payer: Self-pay | Admitting: Family Medicine

## 2021-06-29 ENCOUNTER — Other Ambulatory Visit (HOSPITAL_COMMUNITY): Payer: Self-pay

## 2021-06-29 ENCOUNTER — Other Ambulatory Visit: Payer: Self-pay

## 2021-06-29 DIAGNOSIS — E119 Type 2 diabetes mellitus without complications: Secondary | ICD-10-CM

## 2021-06-30 ENCOUNTER — Encounter: Payer: Self-pay | Admitting: Family Medicine

## 2021-06-30 ENCOUNTER — Other Ambulatory Visit: Payer: Self-pay

## 2021-06-30 ENCOUNTER — Other Ambulatory Visit (HOSPITAL_COMMUNITY): Payer: Self-pay

## 2021-06-30 MED ORDER — BUPROPION HCL ER (XL) 150 MG PO TB24
ORAL_TABLET | Freq: Every day | ORAL | 0 refills | Status: DC
  Filled 2021-06-30: qty 90, 90d supply, fill #0

## 2021-07-01 ENCOUNTER — Other Ambulatory Visit: Payer: Self-pay

## 2021-07-01 ENCOUNTER — Other Ambulatory Visit (HOSPITAL_COMMUNITY): Payer: Self-pay

## 2021-07-01 ENCOUNTER — Encounter: Payer: Self-pay | Admitting: Family Medicine

## 2021-07-01 ENCOUNTER — Ambulatory Visit (INDEPENDENT_AMBULATORY_CARE_PROVIDER_SITE_OTHER): Payer: 59 | Admitting: Family Medicine

## 2021-07-01 VITALS — BP 118/78 | HR 79 | Temp 97.9°F | Ht 66.5 in | Wt 185.0 lb

## 2021-07-01 DIAGNOSIS — I10 Essential (primary) hypertension: Secondary | ICD-10-CM | POA: Diagnosis not present

## 2021-07-01 DIAGNOSIS — Z0000 Encounter for general adult medical examination without abnormal findings: Secondary | ICD-10-CM | POA: Diagnosis not present

## 2021-07-01 LAB — CBC WITH DIFFERENTIAL/PLATELET
Basophils Absolute: 0 10*3/uL (ref 0.0–0.1)
Basophils Relative: 0.4 % (ref 0.0–3.0)
Eosinophils Absolute: 0.1 10*3/uL (ref 0.0–0.7)
Eosinophils Relative: 1.6 % (ref 0.0–5.0)
HCT: 40.5 % (ref 36.0–46.0)
Hemoglobin: 13.4 g/dL (ref 12.0–15.0)
Lymphocytes Relative: 27.6 % (ref 12.0–46.0)
Lymphs Abs: 1.7 10*3/uL (ref 0.7–4.0)
MCHC: 33.2 g/dL (ref 30.0–36.0)
MCV: 88 fl (ref 78.0–100.0)
Monocytes Absolute: 0.3 10*3/uL (ref 0.1–1.0)
Monocytes Relative: 5.4 % (ref 3.0–12.0)
Neutro Abs: 4.1 10*3/uL (ref 1.4–7.7)
Neutrophils Relative %: 65 % (ref 43.0–77.0)
Platelets: 234 10*3/uL (ref 150.0–400.0)
RBC: 4.6 Mil/uL (ref 3.87–5.11)
RDW: 13 % (ref 11.5–15.5)
WBC: 6.3 10*3/uL (ref 4.0–10.5)

## 2021-07-01 LAB — VITAMIN D 25 HYDROXY (VIT D DEFICIENCY, FRACTURES): VITD: 74.87 ng/mL (ref 30.00–100.00)

## 2021-07-01 LAB — LIPID PANEL
Cholesterol: 138 mg/dL (ref 0–200)
HDL: 41.7 mg/dL (ref >39.00)
NonHDL: 96.04
Total CHOL/HDL Ratio: 3
Triglycerides: 265 mg/dL — ABNORMAL HIGH (ref 0.0–149.0)
VLDL: 53 mg/dL — ABNORMAL HIGH (ref 0.0–40.0)

## 2021-07-01 LAB — HEPATIC FUNCTION PANEL
ALT: 31 U/L (ref 0–35)
AST: 18 U/L (ref 0–37)
Albumin: 4.6 g/dL (ref 3.5–5.2)
Alkaline Phosphatase: 76 U/L (ref 39–117)
Bilirubin, Direct: 0.1 mg/dL (ref 0.0–0.3)
Total Bilirubin: 0.7 mg/dL (ref 0.2–1.2)
Total Protein: 7.4 g/dL (ref 6.0–8.3)

## 2021-07-01 LAB — BASIC METABOLIC PANEL
BUN: 18 mg/dL (ref 6–23)
CO2: 25 mEq/L (ref 19–32)
Calcium: 9.7 mg/dL (ref 8.4–10.5)
Chloride: 100 mEq/L (ref 96–112)
Creatinine, Ser: 0.82 mg/dL (ref 0.40–1.20)
GFR: 76.75 mL/min (ref >60.00)
Glucose, Bld: 101 mg/dL — ABNORMAL HIGH (ref 70–99)
Potassium: 4.3 mEq/L (ref 3.5–5.1)
Sodium: 138 mEq/L (ref 135–145)

## 2021-07-01 LAB — TSH: TSH: 1.18 u[IU]/mL (ref 0.35–5.50)

## 2021-07-01 LAB — T3, FREE: T3, Free: 3.3 pg/mL (ref 2.3–4.2)

## 2021-07-01 LAB — LDL CHOLESTEROL, DIRECT: Direct LDL: 52 mg/dL

## 2021-07-01 LAB — T4, FREE: Free T4: 0.79 ng/dL (ref 0.60–1.60)

## 2021-07-01 LAB — HEMOGLOBIN A1C: Hgb A1c MFr Bld: 7.4 % — ABNORMAL HIGH (ref 4.6–6.5)

## 2021-07-01 MED ORDER — LOSARTAN POTASSIUM 50 MG PO TABS
ORAL_TABLET | Freq: Every day | ORAL | 3 refills | Status: DC
  Filled 2021-07-01 – 2021-07-07 (×2): qty 90, 90d supply, fill #0
  Filled 2021-10-03: qty 90, 90d supply, fill #1
  Filled 2022-01-08: qty 90, 90d supply, fill #2
  Filled 2022-04-07: qty 90, 90d supply, fill #3

## 2021-07-01 MED ORDER — FARXIGA 10 MG PO TABS
10.0000 mg | ORAL_TABLET | Freq: Every day | ORAL | 3 refills | Status: DC
Start: 2021-07-01 — End: 2021-10-07
  Filled 2021-07-01: qty 30, 30d supply, fill #0
  Filled 2021-07-01: qty 90, 90d supply, fill #0
  Filled 2021-08-03: qty 90, 90d supply, fill #1

## 2021-07-01 MED ORDER — TIRZEPATIDE 5 MG/0.5ML ~~LOC~~ SOAJ
5.0000 mg | SUBCUTANEOUS | 0 refills | Status: DC
  Filled 2021-07-01: qty 6, 84d supply, fill #0

## 2021-07-01 NOTE — Progress Notes (Signed)
gg  Subjective:    Patient ID: Tracy Anderson, female    DOB: 1961/06/01, 60 y.o.   MRN: [id number]  HPI Here for a well exam. She feels fine but she struggles with her weight. She has been using Victoza with good glucose control (her am fasting readings are 100-120), but she has heard that people can lose weight by taking Mounjaro. She would like to switch.    Review of Systems  Constitutional: Negative.   HENT: Negative.    Eyes: Negative.   Respiratory: Negative.    Cardiovascular: Negative.   Gastrointestinal: Negative.   Genitourinary:  Negative for decreased urine volume, difficulty urinating, dyspareunia, dysuria, enuresis, flank pain, frequency, hematuria, pelvic pain and urgency.  Musculoskeletal: Negative.   Skin: Negative.   Neurological: Negative.  Negative for headaches.  Psychiatric/Behavioral: Negative.        Objective:   Physical Exam Constitutional:      General: She is not in acute distress.    Appearance: Normal appearance. She is well-developed.  HENT:     Head: Normocephalic and atraumatic.     Right Ear: External ear normal.     Left Ear: External ear normal.     Nose: Nose normal.     Mouth/Throat:     Pharynx: No oropharyngeal exudate.  Eyes:     General: No scleral icterus.    Conjunctiva/sclera: Conjunctivae normal.     Pupils: Pupils are equal, round, and reactive to light.  Neck:     Thyroid: No thyromegaly.     Vascular: No JVD.  Cardiovascular:     Rate and Rhythm: Normal rate and regular rhythm.     Heart sounds: Normal heart sounds. No murmur heard.   No friction rub. No gallop.  Pulmonary:     Effort: Pulmonary effort is normal. No respiratory distress.     Breath sounds: Normal breath sounds. No wheezing or rales.  Chest:     Chest wall: No tenderness.  Abdominal:     General: Bowel sounds are normal. There is no distension.     Palpations: Abdomen is soft. There is no mass.     Tenderness: There is no abdominal  tenderness. There is no guarding or rebound.  Musculoskeletal:        General: No tenderness. Normal range of motion.     Cervical back: Normal range of motion and neck supple.  Lymphadenopathy:     Cervical: No cervical adenopathy.  Skin:    General: Skin is warm and dry.     Findings: No erythema or rash.  Neurological:     Mental Status: She is alert and oriented to person, place, and time.     Cranial Nerves: No cranial nerve deficit.     Motor: No abnormal muscle tone.     Coordination: Coordination normal.     Deep Tendon Reflexes: Reflexes are normal and symmetric. Reflexes normal.  Psychiatric:        Behavior: Behavior normal.        Thought Content: Thought content normal.        Judgment: Judgment normal.          Assessment & Plan:  Well exam. We discussed diet and exercise. Get fasting labs including an A1c. We agreed to stop Victoza and she will start on Mounjaro 5 mg weekly. She will return to follow up on this in 4 weeks.  Alysia Penna, MD

## 2021-07-01 NOTE — Addendum Note (Signed)
Addended by: Amanda Cockayne on: 07/01/2021 09:36 AM   Modules accepted: Orders

## 2021-07-04 ENCOUNTER — Other Ambulatory Visit (HOSPITAL_COMMUNITY): Payer: Self-pay

## 2021-07-07 ENCOUNTER — Other Ambulatory Visit (HOSPITAL_COMMUNITY): Payer: Self-pay

## 2021-07-08 ENCOUNTER — Other Ambulatory Visit: Payer: Self-pay

## 2021-07-08 DIAGNOSIS — E119 Type 2 diabetes mellitus without complications: Secondary | ICD-10-CM

## 2021-07-22 ENCOUNTER — Other Ambulatory Visit: Payer: Self-pay | Admitting: Family Medicine

## 2021-07-25 ENCOUNTER — Other Ambulatory Visit (HOSPITAL_COMMUNITY): Payer: Self-pay

## 2021-07-26 ENCOUNTER — Other Ambulatory Visit (HOSPITAL_COMMUNITY): Payer: Self-pay

## 2021-07-26 MED ORDER — METFORMIN HCL 500 MG PO TABS
ORAL_TABLET | ORAL | 0 refills | Status: DC
  Filled 2021-07-26: qty 90, 30d supply, fill #0

## 2021-07-27 ENCOUNTER — Other Ambulatory Visit (HOSPITAL_COMMUNITY): Payer: Self-pay

## 2021-08-03 ENCOUNTER — Other Ambulatory Visit (HOSPITAL_COMMUNITY): Payer: Self-pay

## 2021-08-05 ENCOUNTER — Ambulatory Visit: Payer: 59 | Admitting: Family Medicine

## 2021-08-11 ENCOUNTER — Ambulatory Visit: Payer: 59 | Admitting: Family Medicine

## 2021-08-11 ENCOUNTER — Other Ambulatory Visit: Payer: Self-pay

## 2021-08-11 ENCOUNTER — Encounter: Payer: Self-pay | Admitting: Family Medicine

## 2021-08-11 VITALS — BP 112/76 | HR 75 | Temp 97.8°F | Wt 176.4 lb

## 2021-08-11 DIAGNOSIS — Z6830 Body mass index (BMI) 30.0-30.9, adult: Secondary | ICD-10-CM | POA: Diagnosis not present

## 2021-08-11 DIAGNOSIS — E669 Obesity, unspecified: Secondary | ICD-10-CM | POA: Diagnosis not present

## 2021-08-11 DIAGNOSIS — E119 Type 2 diabetes mellitus without complications: Secondary | ICD-10-CM | POA: Diagnosis not present

## 2021-08-11 MED ORDER — METFORMIN HCL 500 MG PO TABS: 500.0000 mg | ORAL_TABLET | Freq: Two times a day (BID) | ORAL | 0 refills | Status: DC

## 2021-08-11 NOTE — Progress Notes (Signed)
   Subjective:    Patient ID: Tracy Anderson, female    DOB: [birth year], 60 y.o.   MRN: [id number]  HPI Here to follow up on diabetes. She was here on 07-01-21 for a well exam, and at that time we switched her to [facility] injections. Since then she has been very pleased with this medication. She feels well ans has no side effects. She says she no longer craves carbs like she did before. She has lost 8 lbs during this time. Her am fasting glucoses now average from 100 to 120.    Review of Systems  Constitutional: Negative.   Respiratory: Negative.    Cardiovascular: Negative.   Gastrointestinal: Negative.       Objective:   Physical Exam Constitutional:      Appearance: Normal appearance.  Cardiovascular:     Rate and Rhythm: Normal rate and regular rhythm.     Pulses: Normal pulses.     Heart sounds: Normal heart sounds.  Pulmonary:     Effort: Pulmonary effort is normal.     Breath sounds: Normal breath sounds.  Neurological:     Mental Status: She is alert.          Assessment & Plan:  Her diabetes is now uynder much better control and she is losing weight. She will continue on this regimen, and we will see her back in 2 months. At that time we will check another A1c. Alysia Penna, MD

## 2021-08-12 ENCOUNTER — Encounter: Payer: Self-pay | Admitting: Family Medicine

## 2021-08-12 ENCOUNTER — Other Ambulatory Visit: Payer: Self-pay

## 2021-08-12 ENCOUNTER — Other Ambulatory Visit (HOSPITAL_COMMUNITY): Payer: Self-pay

## 2021-08-12 DIAGNOSIS — E119 Type 2 diabetes mellitus without complications: Secondary | ICD-10-CM

## 2021-08-12 MED ORDER — FREESTYLE LITE TEST VI STRP
ORAL_STRIP | 2 refills | Status: AC
  Filled 2021-08-12: qty 200, 66d supply, fill #0
  Filled 2022-01-19: qty 200, 66d supply, fill #1

## 2021-08-12 MED ORDER — FREESTYLE LANCETS MISC
2 refills | Status: AC
Start: 2021-08-12 — End: ?
  Filled 2021-08-12: qty 200, 66d supply, fill #0
  Filled 2022-01-19: qty 200, 66d supply, fill #1

## 2021-08-12 MED ORDER — FREESTYLE LITE W/DEVICE KIT
PACK | 0 refills | Status: DC
  Filled 2021-08-12: qty 1, 30d supply, fill #0

## 2021-08-16 ENCOUNTER — Other Ambulatory Visit (HOSPITAL_BASED_OUTPATIENT_CLINIC_OR_DEPARTMENT_OTHER): Payer: Self-pay

## 2021-08-16 ENCOUNTER — Ambulatory Visit: Payer: 59 | Attending: Internal Medicine

## 2021-08-16 DIAGNOSIS — Z23 Encounter for immunization: Secondary | ICD-10-CM

## 2021-08-16 MED ORDER — PFIZER COVID-19 VAC BIVALENT 30 MCG/0.3ML IM SUSP
INTRAMUSCULAR | 0 refills | Status: DC
  Filled 2021-08-16: qty 0.3, 1d supply, fill #0

## 2021-08-16 NOTE — Progress Notes (Signed)
   Covid-19 Vaccination Clinic  Name:  Calverton Carmack    MRN: [id number] DOB: December 05, 1960  08/16/2021  Ms. Hurd was observed post Covid-19 immunization for 15 minutes without incident. She was provided with Vaccine Information Sheet and instruction to access the V-Safe system.   Ms. Palleschi was instructed to call 911 with any severe reactions post vaccine: Difficulty breathing  Swelling of face and throat  A fast heartbeat  A bad rash all over body  Dizziness and weakness

## 2021-08-17 ENCOUNTER — Encounter: Payer: Self-pay | Admitting: Family Medicine

## 2021-08-18 NOTE — Telephone Encounter (Signed)
Noted  

## 2021-08-29 ENCOUNTER — Other Ambulatory Visit (HOSPITAL_COMMUNITY): Payer: Self-pay

## 2021-09-05 ENCOUNTER — Other Ambulatory Visit: Payer: Self-pay | Admitting: Family Medicine

## 2021-09-05 ENCOUNTER — Other Ambulatory Visit (HOSPITAL_COMMUNITY): Payer: Self-pay

## 2021-09-05 ENCOUNTER — Other Ambulatory Visit: Payer: Self-pay

## 2021-09-05 MED ORDER — METFORMIN HCL 500 MG PO TABS
500.0000 mg | ORAL_TABLET | Freq: Two times a day (BID) | ORAL | 1 refills | Status: DC
  Filled 2021-09-05: qty 120, 60d supply, fill #0
  Filled 2021-11-06: qty 120, 60d supply, fill #1

## 2021-09-05 NOTE — Telephone Encounter (Signed)
New Rx was sent to pt pharmacy, pt is aware

## 2021-09-06 ENCOUNTER — Other Ambulatory Visit (HOSPITAL_COMMUNITY): Payer: Self-pay

## 2021-09-21 ENCOUNTER — Other Ambulatory Visit: Payer: Self-pay

## 2021-09-21 ENCOUNTER — Encounter: Payer: Self-pay | Admitting: Family Medicine

## 2021-09-21 ENCOUNTER — Other Ambulatory Visit (HOSPITAL_COMMUNITY): Payer: Self-pay

## 2021-09-21 MED ORDER — TIRZEPATIDE 5 MG/0.5ML ~~LOC~~ SOAJ
5.0000 mg | SUBCUTANEOUS | 0 refills | Status: DC
  Filled 2021-09-21: qty 6, 84d supply, fill #0

## 2021-09-22 ENCOUNTER — Other Ambulatory Visit (HOSPITAL_COMMUNITY): Payer: Self-pay

## 2021-09-22 ENCOUNTER — Encounter: Payer: Self-pay | Admitting: Family Medicine

## 2021-09-22 MED ORDER — MOLNUPIRAVIR EUA 200MG CAPSULE
4.0000 | ORAL_CAPSULE | Freq: Two times a day (BID) | ORAL | 0 refills | Status: AC
  Filled 2021-09-22: qty 40, 5d supply, fill #0

## 2021-09-22 MED ORDER — TIRZEPATIDE 5 MG/0.5ML ~~LOC~~ SOAJ
5.0000 mg | SUBCUTANEOUS | 11 refills | Status: DC
  Filled 2021-09-22: qty 6, 84d supply, fill #0

## 2021-09-22 NOTE — Telephone Encounter (Signed)
Done

## 2021-09-27 ENCOUNTER — Encounter: Payer: Self-pay | Admitting: Family Medicine

## 2021-10-03 ENCOUNTER — Other Ambulatory Visit: Payer: Self-pay | Admitting: Family Medicine

## 2021-10-04 ENCOUNTER — Other Ambulatory Visit (HOSPITAL_COMMUNITY): Payer: Self-pay

## 2021-10-04 MED ORDER — BUPROPION HCL ER (XL) 150 MG PO TB24
ORAL_TABLET | Freq: Every day | ORAL | 1 refills | Status: DC
  Filled 2021-10-04: qty 90, 90d supply, fill #0
  Filled 2022-01-08: qty 90, 90d supply, fill #1

## 2021-10-07 ENCOUNTER — Encounter: Payer: Self-pay | Admitting: Family Medicine

## 2021-10-07 ENCOUNTER — Ambulatory Visit: Payer: 59 | Admitting: Family Medicine

## 2021-10-07 VITALS — BP 110/68 | HR 79 | Temp 97.9°F | Wt 170.0 lb

## 2021-10-07 DIAGNOSIS — E119 Type 2 diabetes mellitus without complications: Secondary | ICD-10-CM | POA: Diagnosis not present

## 2021-10-07 LAB — POCT GLYCOSYLATED HEMOGLOBIN (HGB A1C): Hemoglobin A1C: 6.2 % — AB (ref 4.0–5.6)

## 2021-10-07 MED ORDER — DAPAGLIFLOZIN PROPANEDIOL 5 MG PO TABS: 5.0000 mg | ORAL_TABLET | Freq: Every day | ORAL | 0 refills | Status: DC

## 2021-10-07 NOTE — Progress Notes (Signed)
   Subjective:    Patient ID: Tracy Anderson, female    DOB: [birth year], 60 y.o.   MRN: [id number]  HPI Here to follow up on diabetes. She has been taking Mounjaro and this has been working quite well. Her A1c today is down from 7.4 to 6.2%. She has lost about 15 lbs since August. She feels good.    Review of Systems  Constitutional: Negative.   Respiratory: Negative.    Cardiovascular: Negative.       Objective:   Physical Exam Constitutional:      Appearance: Normal appearance.  Cardiovascular:     Rate and Rhythm: Normal rate and regular rhythm.     Pulses: Normal pulses.     Heart sounds: Normal heart sounds.  Pulmonary:     Effort: Pulmonary effort is normal.     Breath sounds: Normal breath sounds.  Neurological:     Mental Status: She is alert.          Assessment & Plan:  Her diabetes is improving and she is losing some weight. We will decrease the Farxiga to 5 mg daily. Recheck in 3 months.  Alysia Penna, MD

## 2021-11-06 ENCOUNTER — Other Ambulatory Visit (HOSPITAL_COMMUNITY): Payer: Self-pay

## 2021-11-07 ENCOUNTER — Other Ambulatory Visit (HOSPITAL_COMMUNITY): Payer: Self-pay

## 2021-11-11 IMAGING — MG DIGITAL SCREENING BILAT W/ TOMO W/ CAD
8 series · 8 of 24 positions shown · non-contrast
Comparison: Previous exam(s).

CLINICAL DATA: Screening.

EXAM:
DIGITAL SCREENING BILATERAL MAMMOGRAM WITH TOMO AND CAD

[L MLO synth-2D]
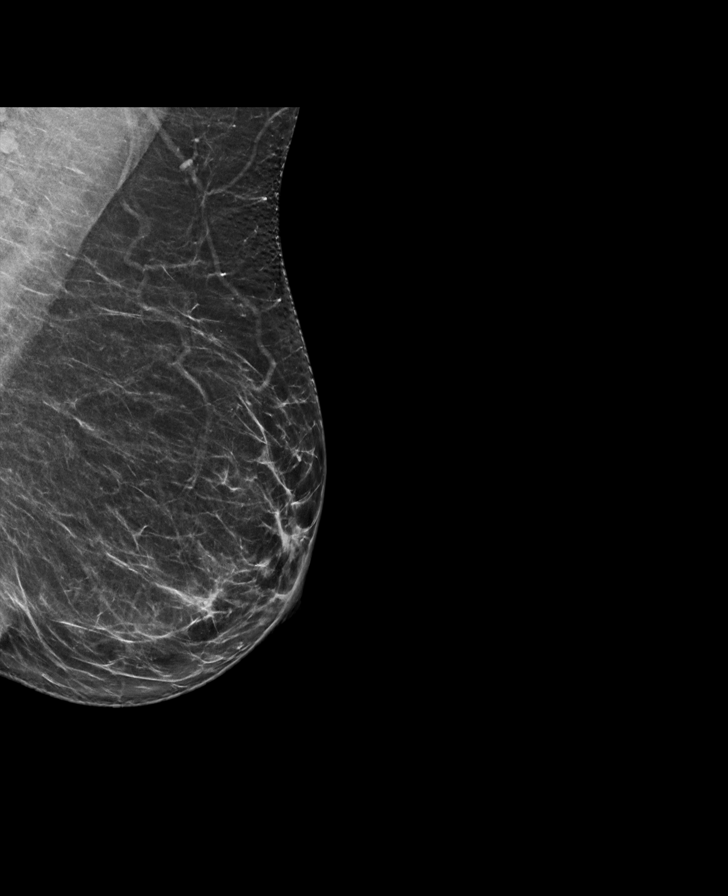

[R CC synth-2D]
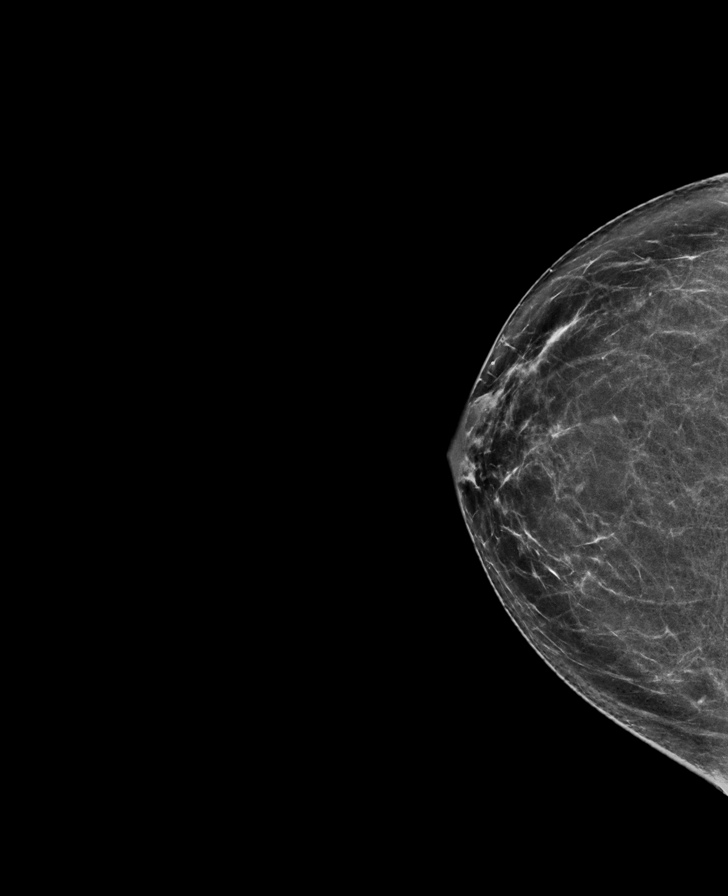

[L CC synth-2D]
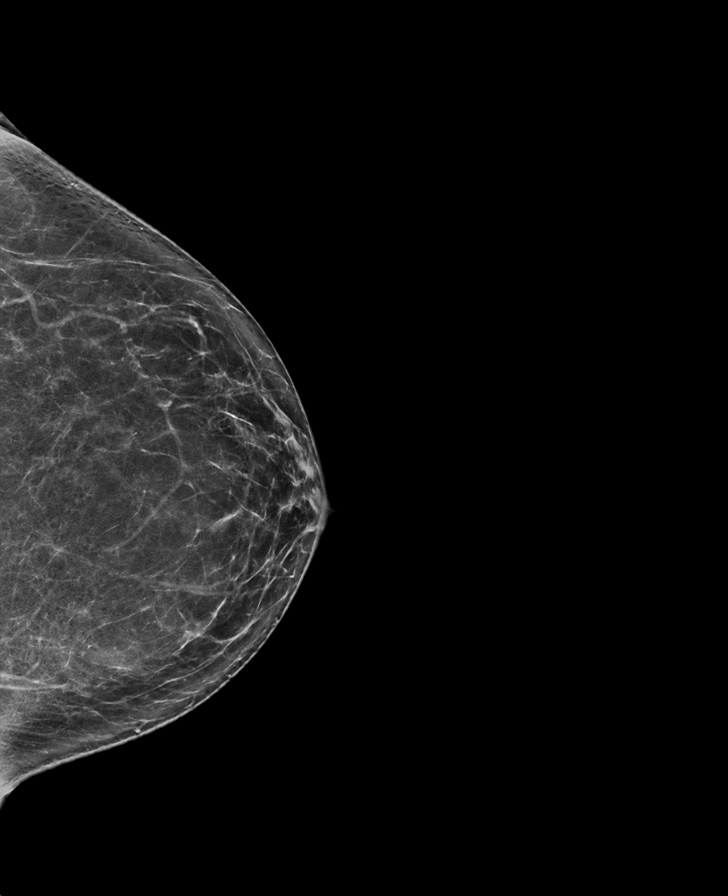

[R MLO synth-2D]
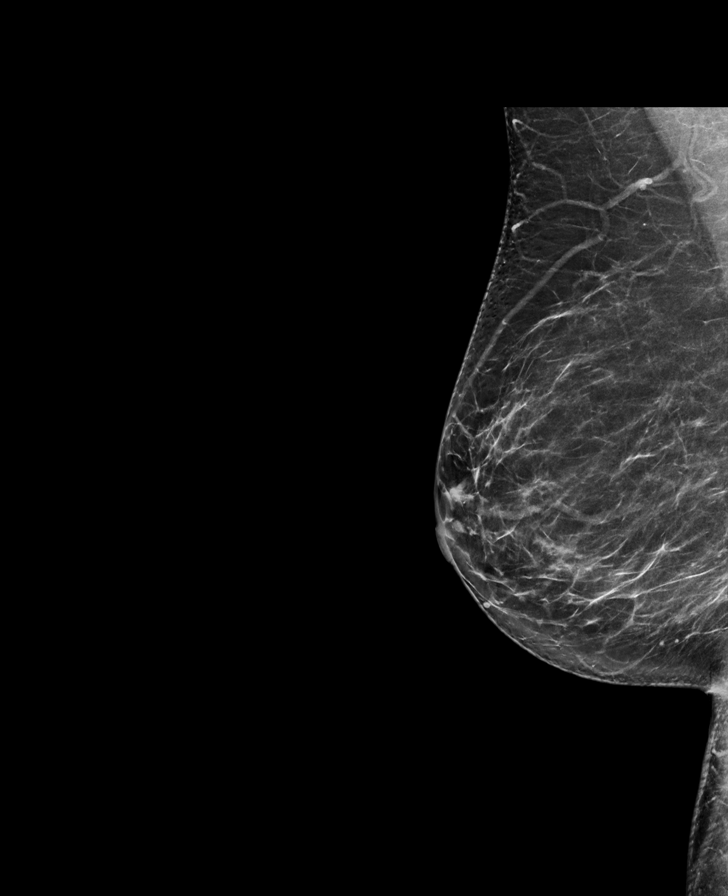

[R MLO tomo · tomo slice 37/73.0]
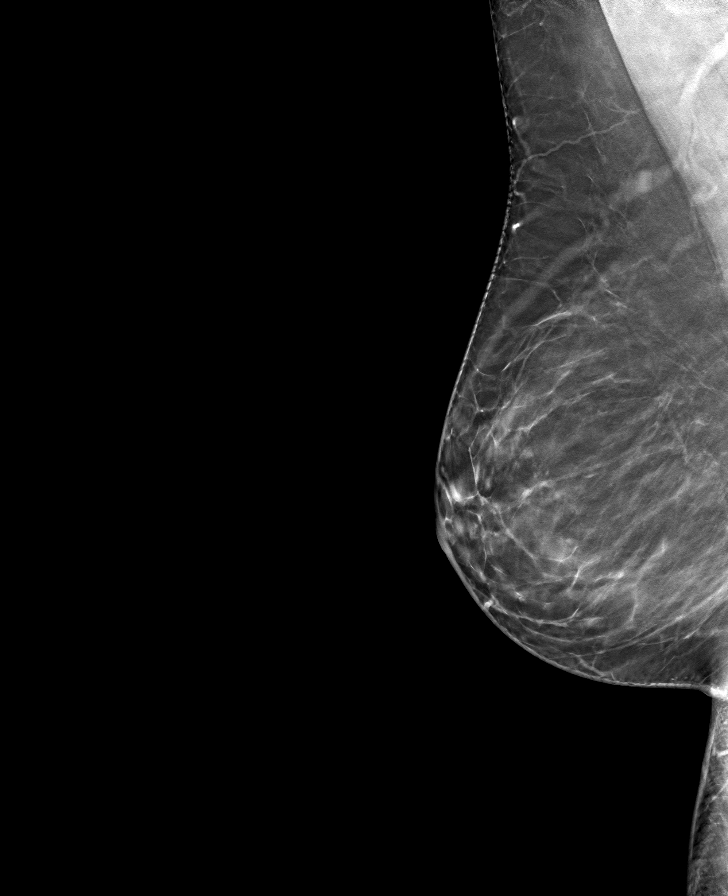

[R CC tomo · tomo slice 35/68.0]
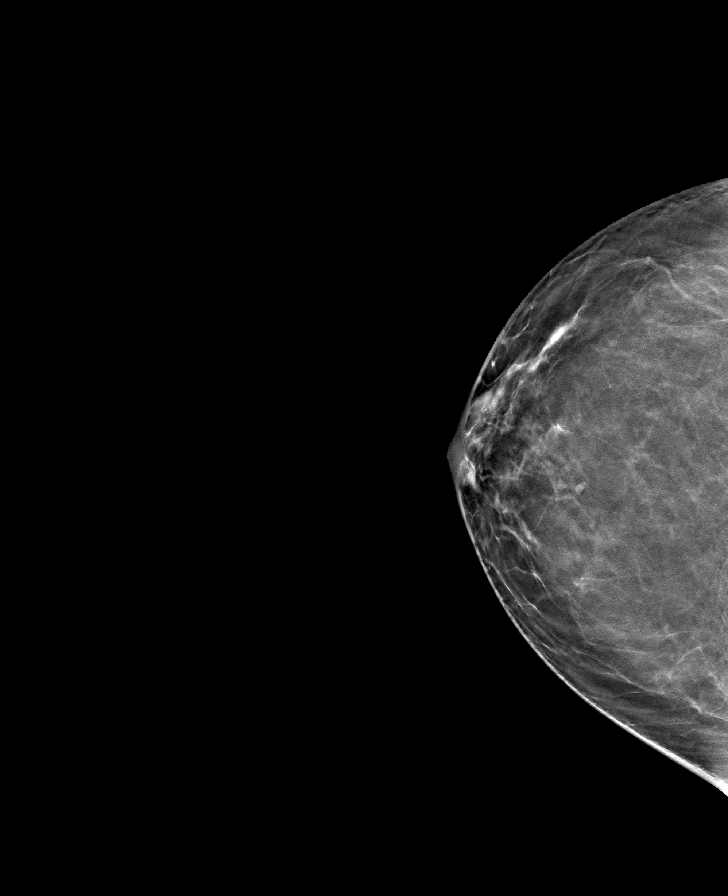

[L MLO tomo · tomo slice 39/77.0]
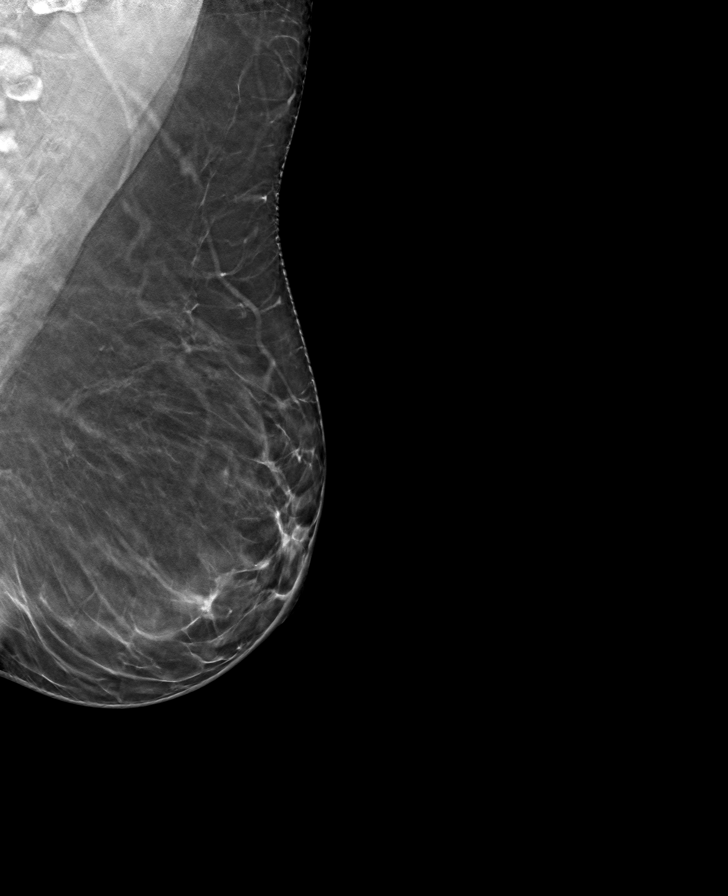

[L CC tomo · tomo slice 37/72.0]
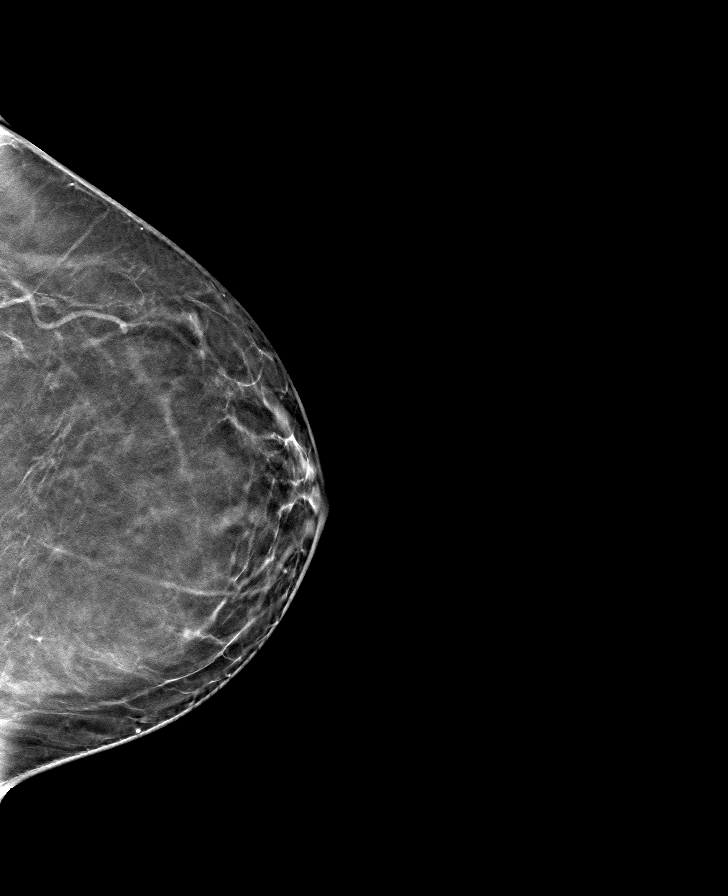

[8 of 24 positions shown; findings below may reference images not displayed]

ACR Breast Density Category b: There are scattered areas of
fibroglandular density.
FINDINGS: There are no findings suspicious for malignancy. Images were
processed with CAD.
IMPRESSION: No mammographic evidence of malignancy. A result letter of this
screening mammogram will be mailed directly to the patient.

RECOMMENDATION:
Screening mammogram in one year. (Code:[id number]-775)

BI-RADS CATEGORY  1: Negative.

## 2021-11-28 ENCOUNTER — Other Ambulatory Visit (HOSPITAL_COMMUNITY): Payer: Self-pay

## 2021-11-30 ENCOUNTER — Other Ambulatory Visit (HOSPITAL_COMMUNITY): Payer: Self-pay

## 2021-11-30 ENCOUNTER — Other Ambulatory Visit: Payer: Self-pay | Admitting: Family Medicine

## 2021-11-30 DIAGNOSIS — E119 Type 2 diabetes mellitus without complications: Secondary | ICD-10-CM

## 2021-12-01 ENCOUNTER — Other Ambulatory Visit: Payer: Self-pay

## 2021-12-01 ENCOUNTER — Other Ambulatory Visit (HOSPITAL_COMMUNITY): Payer: Self-pay

## 2021-12-01 DIAGNOSIS — E119 Type 2 diabetes mellitus without complications: Secondary | ICD-10-CM

## 2021-12-01 MED ORDER — DAPAGLIFLOZIN PROPANEDIOL 5 MG PO TABS
5.0000 mg | ORAL_TABLET | Freq: Every day | ORAL | 2 refills | Status: DC
  Filled 2021-12-01: qty 30, 30d supply, fill #0
  Filled 2022-01-02: qty 30, 30d supply, fill #1

## 2021-12-02 ENCOUNTER — Other Ambulatory Visit (HOSPITAL_COMMUNITY): Payer: Self-pay

## 2021-12-05 ENCOUNTER — Other Ambulatory Visit (HOSPITAL_COMMUNITY): Payer: Self-pay

## 2021-12-12 ENCOUNTER — Encounter: Payer: Self-pay | Admitting: Family Medicine

## 2021-12-12 ENCOUNTER — Other Ambulatory Visit (HOSPITAL_COMMUNITY): Payer: Self-pay

## 2021-12-12 MED ORDER — TIRZEPATIDE 10 MG/0.5ML ~~LOC~~ SOAJ
10.0000 mg | SUBCUTANEOUS | 5 refills | Status: DC
  Filled 2021-12-12: qty 2, 28d supply, fill #0

## 2021-12-12 NOTE — Telephone Encounter (Signed)
I sent in a RX for the 10 mg dose

## 2021-12-13 ENCOUNTER — Other Ambulatory Visit (HOSPITAL_COMMUNITY): Payer: Self-pay

## 2021-12-14 ENCOUNTER — Other Ambulatory Visit (HOSPITAL_COMMUNITY): Payer: Self-pay

## 2021-12-14 ENCOUNTER — Encounter: Payer: Self-pay | Admitting: Family Medicine

## 2021-12-15 ENCOUNTER — Other Ambulatory Visit (HOSPITAL_COMMUNITY): Payer: Self-pay

## 2021-12-15 MED ORDER — TIRZEPATIDE 7.5 MG/0.5ML ~~LOC~~ SOAJ
7.5000 mg | SUBCUTANEOUS | 3 refills | Status: DC
  Filled 2021-12-15: qty 6, 84d supply, fill #0

## 2021-12-15 NOTE — Telephone Encounter (Signed)
I sent in the 7.5  mg dose

## 2021-12-16 ENCOUNTER — Other Ambulatory Visit (HOSPITAL_COMMUNITY): Payer: Self-pay

## 2021-12-19 ENCOUNTER — Other Ambulatory Visit (HOSPITAL_COMMUNITY): Payer: Self-pay

## 2022-01-02 ENCOUNTER — Other Ambulatory Visit: Payer: Self-pay | Admitting: Family Medicine

## 2022-01-02 ENCOUNTER — Other Ambulatory Visit (HOSPITAL_COMMUNITY): Payer: Self-pay

## 2022-01-02 DIAGNOSIS — E119 Type 2 diabetes mellitus without complications: Secondary | ICD-10-CM

## 2022-01-03 ENCOUNTER — Other Ambulatory Visit (HOSPITAL_COMMUNITY): Payer: Self-pay

## 2022-01-03 ENCOUNTER — Other Ambulatory Visit: Payer: Self-pay

## 2022-01-03 DIAGNOSIS — E119 Type 2 diabetes mellitus without complications: Secondary | ICD-10-CM

## 2022-01-03 MED ORDER — DAPAGLIFLOZIN PROPANEDIOL 5 MG PO TABS
5.0000 mg | ORAL_TABLET | Freq: Every day | ORAL | 1 refills | Status: DC
  Filled 2022-01-03: qty 90, 90d supply, fill #0

## 2022-01-06 ENCOUNTER — Ambulatory Visit: Payer: 59 | Admitting: Family Medicine

## 2022-01-08 ENCOUNTER — Other Ambulatory Visit: Payer: Self-pay | Admitting: Family Medicine

## 2022-01-08 DIAGNOSIS — E119 Type 2 diabetes mellitus without complications: Secondary | ICD-10-CM

## 2022-01-09 ENCOUNTER — Other Ambulatory Visit (HOSPITAL_COMMUNITY): Payer: Self-pay

## 2022-01-09 MED ORDER — METFORMIN HCL 500 MG PO TABS
500.0000 mg | ORAL_TABLET | Freq: Two times a day (BID) | ORAL | 0 refills | Status: DC
  Filled 2022-01-09: qty 180, 90d supply, fill #0

## 2022-01-10 ENCOUNTER — Ambulatory Visit: Payer: 59 | Admitting: Family Medicine

## 2022-01-10 ENCOUNTER — Encounter: Payer: Self-pay | Admitting: Family Medicine

## 2022-01-10 VITALS — BP 110/70 | HR 63 | Temp 98.1°F | Wt 164.0 lb

## 2022-01-10 DIAGNOSIS — E669 Obesity, unspecified: Secondary | ICD-10-CM

## 2022-01-10 DIAGNOSIS — Z6830 Body mass index (BMI) 30.0-30.9, adult: Secondary | ICD-10-CM | POA: Diagnosis not present

## 2022-01-10 DIAGNOSIS — I10 Essential (primary) hypertension: Secondary | ICD-10-CM | POA: Diagnosis not present

## 2022-01-10 DIAGNOSIS — E119 Type 2 diabetes mellitus without complications: Secondary | ICD-10-CM

## 2022-01-10 LAB — POCT GLYCOSYLATED HEMOGLOBIN (HGB A1C): Hemoglobin A1C: 6.4 % — AB (ref 4.0–5.6)

## 2022-01-10 NOTE — Progress Notes (Signed)
? ?  Subjective:  ? ? Patient ID: Samira Acero, female    DOB: [birth year], 61 y.o.   MRN: [id number] ? ?HPI ?Here to follow on diabetes and obesity. She continues to use Mounjaro in addition to [place] and Metformin. She has now lost 21 lbs since last August and she feels great. Her A1c today is 6.4%. She asks if we can reduce her medications at all.  ? ? ?Review of Systems  ?Constitutional: Negative.   ?Respiratory: Negative.    ?Cardiovascular: Negative.   ? ?   ?Objective:  ? Physical Exam ?Constitutional:   ?   Appearance: Normal appearance.  ?Cardiovascular:  ?   Rate and Rhythm: Normal rate and regular rhythm.  ?   Pulses: Normal pulses.  ?   Heart sounds: Normal heart sounds.  ?Pulmonary:  ?   Effort: Pulmonary effort is normal.  ?   Breath sounds: Normal breath sounds.  ?Neurological:  ?   Mental Status: She is alert.  ? ? ? ? ? ?   ?Assessment & Plan:  ?Her diabetes is well controlled and she is losing weight as predicted. We will stop the [place] and continue on the [facility] and Metformin. Recheck in 3 months.  ?Alysia Penna, MD ? ? ?

## 2022-01-19 ENCOUNTER — Other Ambulatory Visit (HOSPITAL_COMMUNITY): Payer: Self-pay

## 2022-01-20 ENCOUNTER — Other Ambulatory Visit (HOSPITAL_COMMUNITY): Payer: Self-pay

## 2022-01-23 ENCOUNTER — Encounter: Payer: Self-pay | Admitting: Family Medicine

## 2022-01-23 ENCOUNTER — Other Ambulatory Visit (HOSPITAL_COMMUNITY): Payer: Self-pay

## 2022-01-23 MED ORDER — TIRZEPATIDE 7.5 MG/0.5ML ~~LOC~~ SOAJ
7.5000 mg | SUBCUTANEOUS | 0 refills | Status: DC
  Filled 2022-01-23 – 2022-02-01 (×2): qty 6, 84d supply, fill #0

## 2022-01-23 NOTE — Telephone Encounter (Signed)
Done for one month  ?

## 2022-01-27 ENCOUNTER — Other Ambulatory Visit (HOSPITAL_COMMUNITY): Payer: Self-pay

## 2022-01-31 ENCOUNTER — Encounter: Payer: Self-pay | Admitting: Family Medicine

## 2022-02-01 ENCOUNTER — Other Ambulatory Visit (HOSPITAL_COMMUNITY): Payer: Self-pay

## 2022-02-02 ENCOUNTER — Other Ambulatory Visit (HOSPITAL_COMMUNITY): Payer: Self-pay

## 2022-02-02 MED ORDER — TIRZEPATIDE 2.5 MG/0.5ML ~~LOC~~ SOAJ
2.5000 mg | SUBCUTANEOUS | 3 refills | Status: DC
  Filled 2022-02-02: qty 6, 84d supply, fill #0

## 2022-02-02 MED ORDER — TIRZEPATIDE 5 MG/0.5ML ~~LOC~~ SOAJ
5.0000 mg | SUBCUTANEOUS | 3 refills | Status: DC
  Filled 2022-02-02 (×2): qty 6, 84d supply, fill #0

## 2022-02-02 NOTE — Telephone Encounter (Signed)
I sent in RX for both the 5 mg and the 2.5 mg for 3 months  ?

## 2022-02-06 ENCOUNTER — Encounter: Payer: Self-pay | Admitting: Family Medicine

## 2022-02-08 ENCOUNTER — Other Ambulatory Visit (HOSPITAL_COMMUNITY): Payer: Self-pay

## 2022-02-09 NOTE — Telephone Encounter (Signed)
I have never received anything from [employer]  ?

## 2022-02-17 NOTE — Telephone Encounter (Signed)
Spoke with pt advise that the office has not received the form, pt state that she will go by the [employer] and fax the form to [place]. ?

## 2022-02-21 NOTE — Telephone Encounter (Signed)
Pt form was received and placed in Dr Sarajane Jews red folder ?

## 2022-02-27 NOTE — Telephone Encounter (Signed)
The form is ready  

## 2022-04-07 ENCOUNTER — Other Ambulatory Visit: Payer: Self-pay | Admitting: Family Medicine

## 2022-04-07 ENCOUNTER — Other Ambulatory Visit (HOSPITAL_COMMUNITY): Payer: Self-pay

## 2022-04-07 DIAGNOSIS — E119 Type 2 diabetes mellitus without complications: Secondary | ICD-10-CM

## 2022-04-07 MED ORDER — BUPROPION HCL ER (XL) 150 MG PO TB24
ORAL_TABLET | Freq: Every day | ORAL | 0 refills | Status: DC
  Filled 2022-04-07: qty 90, 90d supply, fill #0

## 2022-04-07 MED ORDER — METFORMIN HCL 500 MG PO TABS
500.0000 mg | ORAL_TABLET | Freq: Two times a day (BID) | ORAL | 0 refills | Status: DC
  Filled 2022-04-07: qty 180, 90d supply, fill #0

## 2022-04-08 ENCOUNTER — Other Ambulatory Visit (HOSPITAL_COMMUNITY): Payer: Self-pay

## 2022-04-28 ENCOUNTER — Other Ambulatory Visit (HOSPITAL_COMMUNITY): Payer: Self-pay

## 2022-04-28 ENCOUNTER — Encounter: Payer: Self-pay | Admitting: Family Medicine

## 2022-04-28 ENCOUNTER — Ambulatory Visit (INDEPENDENT_AMBULATORY_CARE_PROVIDER_SITE_OTHER): Payer: No Typology Code available for payment source | Admitting: Family Medicine

## 2022-04-28 VITALS — BP 110/80 | HR 75 | Temp 97.9°F | Wt 164.0 lb

## 2022-04-28 DIAGNOSIS — I10 Essential (primary) hypertension: Secondary | ICD-10-CM

## 2022-04-28 DIAGNOSIS — E119 Type 2 diabetes mellitus without complications: Secondary | ICD-10-CM | POA: Diagnosis not present

## 2022-04-28 LAB — POCT GLYCOSYLATED HEMOGLOBIN (HGB A1C): Hemoglobin A1C: 5.7 % — AB (ref 4.0–5.6)

## 2022-04-28 MED ORDER — TIRZEPATIDE 10 MG/0.5ML ~~LOC~~ SOAJ
10.0000 mg | SUBCUTANEOUS | 3 refills | Status: DC
  Filled 2022-04-28: qty 2, 28d supply, fill #0

## 2022-04-28 NOTE — Progress Notes (Signed)
   Subjective:    Patient ID: Tracy Anderson, female    DOB: 01/04/1961, 61 y.o.   MRN: [id number]  HPI Here to follow up on diabetes and weight loss. She has been using 5 mg Mounjaro weekly, and she feels great. Her weight is about the same as last time, but she feels more energy. Her A1c today is down to 5.7%.    Review of Systems  Constitutional: Negative.   Respiratory: Negative.    Cardiovascular: Negative.        Objective:   Physical Exam Constitutional:      Appearance: Normal appearance.  Cardiovascular:     Rate and Rhythm: Normal rate and regular rhythm.     Pulses: Normal pulses.     Heart sounds: Normal heart sounds.  Pulmonary:     Effort: Pulmonary effort is normal.     Breath sounds: Normal breath sounds.  Neurological:     Mental Status: She is alert.           Assessment & Plan:  She is doing well with the [facility] ,her diabetes and BP are well controlled. We will increase the Mounjaro to 10 mg weekly and we will stop the Metformin. Recheck in 3 months.  Gershon Crane, MD

## 2022-05-01 ENCOUNTER — Other Ambulatory Visit: Payer: Self-pay | Admitting: Family Medicine

## 2022-05-01 DIAGNOSIS — Z1231 Encounter for screening mammogram for malignant neoplasm of breast: Secondary | ICD-10-CM

## 2022-05-03 ENCOUNTER — Other Ambulatory Visit (HOSPITAL_COMMUNITY): Payer: Self-pay

## 2022-05-04 ENCOUNTER — Encounter: Payer: Self-pay | Admitting: Family Medicine

## 2022-05-05 ENCOUNTER — Other Ambulatory Visit: Payer: Self-pay

## 2022-05-05 DIAGNOSIS — E119 Type 2 diabetes mellitus without complications: Secondary | ICD-10-CM

## 2022-05-05 MED ORDER — TIRZEPATIDE 10 MG/0.5ML ~~LOC~~ SOAJ: 10.0000 mg | SUBCUTANEOUS | 3 refills | Status: DC

## 2022-05-30 ENCOUNTER — Other Ambulatory Visit (HOSPITAL_COMMUNITY): Payer: Self-pay

## 2022-05-30 ENCOUNTER — Other Ambulatory Visit: Payer: Self-pay | Admitting: Family Medicine

## 2022-05-31 ENCOUNTER — Other Ambulatory Visit: Payer: Self-pay

## 2022-05-31 ENCOUNTER — Other Ambulatory Visit (HOSPITAL_COMMUNITY): Payer: Self-pay

## 2022-05-31 ENCOUNTER — Telehealth: Payer: Self-pay | Admitting: Family Medicine

## 2022-05-31 DIAGNOSIS — E119 Type 2 diabetes mellitus without complications: Secondary | ICD-10-CM

## 2022-05-31 MED ORDER — TIRZEPATIDE 10 MG/0.5ML ~~LOC~~ SOAJ
10.0000 mg | SUBCUTANEOUS | 3 refills | Status: DC
  Filled 2022-05-31: qty 2, 28d supply, fill #0
  Filled 2022-06-16 – 2022-06-21 (×2): qty 2, 28d supply, fill #1
  Filled 2022-07-26: qty 2, 28d supply, fill #2
  Filled 2022-08-24: qty 2, 28d supply, fill #3
  Filled 2022-09-18: qty 2, 28d supply, fill #4
  Filled 2022-10-17: qty 2, 28d supply, fill #5
  Filled 2022-11-17: qty 2, 28d supply, fill #6
  Filled 2022-12-08: qty 2, 28d supply, fill #7
  Filled 2023-01-12: qty 2, 28d supply, fill #8

## 2022-05-31 NOTE — Telephone Encounter (Signed)
Rx sent per pt request 

## 2022-05-31 NOTE — Telephone Encounter (Signed)
Patient calling to request   tirzepatide [facility]) 10 MG/0.5ML Pen  Please send to: [facility] (Has it in stock right now)  Because Tracy Anderson does not have it.  Please send as soon as possible.

## 2022-06-01 ENCOUNTER — Encounter: Payer: Self-pay | Admitting: Physician Assistant

## 2022-06-01 ENCOUNTER — Ambulatory Visit (INDEPENDENT_AMBULATORY_CARE_PROVIDER_SITE_OTHER): Payer: No Typology Code available for payment source | Admitting: Physician Assistant

## 2022-06-01 DIAGNOSIS — Z808 Family history of malignant neoplasm of other organs or systems: Secondary | ICD-10-CM

## 2022-06-01 DIAGNOSIS — Z85828 Personal history of other malignant neoplasm of skin: Secondary | ICD-10-CM | POA: Diagnosis not present

## 2022-06-01 DIAGNOSIS — Z1283 Encounter for screening for malignant neoplasm of skin: Secondary | ICD-10-CM

## 2022-06-14 ENCOUNTER — Encounter (INDEPENDENT_AMBULATORY_CARE_PROVIDER_SITE_OTHER): Payer: Self-pay

## 2022-06-15 ENCOUNTER — Encounter: Payer: Self-pay | Admitting: Physician Assistant

## 2022-06-15 NOTE — Progress Notes (Signed)
   Follow-Up Visit   Subjective  Atiyana Anderson is a 61 y.o. female who presents for the following: Annual Exam (Patient here today for skin check, no concerns. Personal history and family history of non mole skin cancer. ).   The following portions of the chart were reviewed this encounter and updated as appropriate:  Tobacco  Allergies  Meds  Problems  Med Hx  Surg Hx  Fam Hx      Objective  Well appearing patient in no apparent distress; mood and affect are within normal limits.  A full examination was performed including scalp, head, eyes, ears, nose, lips, neck, chest, axillae, abdomen, back, buttocks, bilateral upper extremities, bilateral lower extremities, hands, feet, fingers, toes, fingernails, and toenails. All findings within normal limits unless otherwise noted below.  Full body skin examination- No atypical nevi or signs of NMSC noted at the time of the visit.    Assessment & Plan  Encounter for screening for malignant neoplasm of skin  Yearly skin examination     I, Giavana Rooke, PA-C, have reviewed all documentation's for this visit.  The documentation on 06/15/22 for the exam, diagnosis, procedures and orders are all accurate and complete.

## 2022-06-16 ENCOUNTER — Other Ambulatory Visit (HOSPITAL_COMMUNITY): Payer: Self-pay

## 2022-06-16 ENCOUNTER — Ambulatory Visit
Admission: RE | Admit: 2022-06-16 | Discharge: 2022-06-16 | Disposition: A | Discharge status: Home / Self Care | Payer: No Typology Code available for payment source | Source: Ambulatory Visit | Attending: Family Medicine | Admitting: Family Medicine

## 2022-06-16 DIAGNOSIS — Z1231 Encounter for screening mammogram for malignant neoplasm of breast: Secondary | ICD-10-CM

## 2022-06-19 ENCOUNTER — Encounter: Payer: Self-pay | Admitting: Family Medicine

## 2022-06-20 ENCOUNTER — Other Ambulatory Visit (HOSPITAL_COMMUNITY): Payer: Self-pay

## 2022-06-20 ENCOUNTER — Other Ambulatory Visit: Payer: Self-pay | Admitting: Family Medicine

## 2022-06-20 DIAGNOSIS — R928 Other abnormal and inconclusive findings on diagnostic imaging of breast: Secondary | ICD-10-CM

## 2022-06-20 MED ORDER — VITAMIN D (ERGOCALCIFEROL) 1.25 MG (50000 UNIT) PO CAPS
50000.0000 [IU] | ORAL_CAPSULE | ORAL | 3 refills | Status: DC
  Filled 2022-06-20: qty 12, 84d supply, fill #0
  Filled 2022-08-29: qty 12, 84d supply, fill #1
  Filled 2022-11-25 – 2022-12-05 (×2): qty 12, 84d supply, fill #2
  Filled 2023-03-16: qty 12, 84d supply, fill #3

## 2022-06-20 NOTE — Telephone Encounter (Signed)
Yes please send this in

## 2022-06-21 ENCOUNTER — Other Ambulatory Visit (HOSPITAL_COMMUNITY): Payer: Self-pay

## 2022-06-21 ENCOUNTER — Encounter: Payer: Self-pay | Admitting: Family Medicine

## 2022-06-22 NOTE — Telephone Encounter (Signed)
Tell her not to worry. Many women need to go back in for additional tests, and the vast majority of these are benign. It is important to have these tests however just to make sure

## 2022-06-23 ENCOUNTER — Other Ambulatory Visit (HOSPITAL_COMMUNITY): Payer: Self-pay

## 2022-06-30 ENCOUNTER — Ambulatory Visit
Admission: RE | Admit: 2022-06-30 | Discharge: 2022-06-30 | Disposition: A | Discharge status: Home / Self Care | Payer: No Typology Code available for payment source | Source: Ambulatory Visit | Attending: Family Medicine | Admitting: Family Medicine

## 2022-06-30 ENCOUNTER — Ambulatory Visit: Payer: No Typology Code available for payment source

## 2022-06-30 DIAGNOSIS — R928 Other abnormal and inconclusive findings on diagnostic imaging of breast: Secondary | ICD-10-CM

## 2022-07-02 ENCOUNTER — Other Ambulatory Visit: Payer: Self-pay | Admitting: Family Medicine

## 2022-07-03 ENCOUNTER — Other Ambulatory Visit (HOSPITAL_COMMUNITY): Payer: Self-pay

## 2022-07-03 MED ORDER — ATORVASTATIN CALCIUM 10 MG PO TABS
10.0000 mg | ORAL_TABLET | Freq: Every day | ORAL | 0 refills | Status: DC
  Filled 2022-07-03: qty 90, 90d supply, fill #0

## 2022-07-11 ENCOUNTER — Other Ambulatory Visit (HOSPITAL_COMMUNITY): Payer: Self-pay

## 2022-07-11 ENCOUNTER — Other Ambulatory Visit: Payer: Self-pay | Admitting: Family Medicine

## 2022-07-11 DIAGNOSIS — I10 Essential (primary) hypertension: Secondary | ICD-10-CM

## 2022-07-11 MED ORDER — LOSARTAN POTASSIUM 50 MG PO TABS
ORAL_TABLET | Freq: Every day | ORAL | 3 refills | Status: DC
  Filled 2022-07-11: qty 90, 90d supply, fill #0

## 2022-07-11 MED ORDER — BUPROPION HCL ER (XL) 150 MG PO TB24
ORAL_TABLET | Freq: Every day | ORAL | 0 refills | Status: DC
  Filled 2022-07-11: qty 90, 90d supply, fill #0

## 2022-07-11 NOTE — Telephone Encounter (Signed)
Pt LOV  was 04/28/22 Last refill was done on 04/07/22 Please advise

## 2022-07-26 ENCOUNTER — Other Ambulatory Visit (HOSPITAL_COMMUNITY): Payer: Self-pay

## 2022-07-26 ENCOUNTER — Encounter: Payer: Self-pay | Admitting: Family Medicine

## 2022-07-27 NOTE — Telephone Encounter (Signed)
Yes her husband can come in with her

## 2022-07-31 ENCOUNTER — Encounter: Payer: Self-pay | Admitting: Family Medicine

## 2022-08-04 ENCOUNTER — Ambulatory Visit (INDEPENDENT_AMBULATORY_CARE_PROVIDER_SITE_OTHER): Payer: No Typology Code available for payment source | Admitting: Family Medicine

## 2022-08-04 ENCOUNTER — Encounter: Payer: Self-pay | Admitting: Family Medicine

## 2022-08-04 ENCOUNTER — Other Ambulatory Visit (HOSPITAL_COMMUNITY): Payer: Self-pay

## 2022-08-04 VITALS — BP 98/70 | HR 71 | Temp 98.0°F | Ht 66.0 in | Wt 163.0 lb

## 2022-08-04 DIAGNOSIS — Z0000 Encounter for general adult medical examination without abnormal findings: Secondary | ICD-10-CM

## 2022-08-04 DIAGNOSIS — R6882 Decreased libido: Secondary | ICD-10-CM | POA: Diagnosis not present

## 2022-08-04 DIAGNOSIS — E559 Vitamin D deficiency, unspecified: Secondary | ICD-10-CM

## 2022-08-04 DIAGNOSIS — Z23 Encounter for immunization: Secondary | ICD-10-CM

## 2022-08-04 LAB — CBC WITH DIFFERENTIAL/PLATELET
Basophils Absolute: 0 10*3/uL (ref 0.0–0.1)
Basophils Relative: 0.4 % (ref 0.0–3.0)
Eosinophils Absolute: 0.1 10*3/uL (ref 0.0–0.7)
Eosinophils Relative: 1.4 % (ref 0.0–5.0)
HCT: 42.9 % (ref 36.0–46.0)
Hemoglobin: 14.5 g/dL (ref 12.0–15.0)
Lymphocytes Relative: 31.3 % (ref 12.0–46.0)
Lymphs Abs: 2.4 10*3/uL (ref 0.7–4.0)
MCHC: 33.9 g/dL (ref 30.0–36.0)
MCV: 88.2 fl (ref 78.0–100.0)
Monocytes Absolute: 0.5 10*3/uL (ref 0.1–1.0)
Monocytes Relative: 6.1 % (ref 3.0–12.0)
Neutro Abs: 4.7 10*3/uL (ref 1.4–7.7)
Neutrophils Relative %: 60.8 % (ref 43.0–77.0)
Platelets: 255 10*3/uL (ref 150.0–400.0)
RBC: 4.86 Mil/uL (ref 3.87–5.11)
RDW: 12.8 % (ref 11.5–15.5)
WBC: 7.8 10*3/uL (ref 4.0–10.5)

## 2022-08-04 LAB — HEPATIC FUNCTION PANEL
ALT: 23 U/L (ref 0–35)
AST: 17 U/L (ref 0–37)
Albumin: 4 g/dL (ref 3.5–5.2)
Alkaline Phosphatase: 59 U/L (ref 39–117)
Bilirubin, Direct: 0.2 mg/dL (ref 0.0–0.3)
Total Bilirubin: 0.7 mg/dL (ref 0.2–1.2)
Total Protein: 6 g/dL (ref 6.0–8.3)

## 2022-08-04 LAB — LIPID PANEL
Cholesterol: 112 mg/dL (ref 0–200)
HDL: 50 mg/dL (ref >39.00)
LDL Cholesterol: 41 mg/dL (ref 0–99)
NonHDL: 61.53
Total CHOL/HDL Ratio: 2
Triglycerides: 103 mg/dL (ref 0.0–149.0)
VLDL: 20.6 mg/dL (ref 0.0–40.0)

## 2022-08-04 LAB — BASIC METABOLIC PANEL
BUN: 12 mg/dL (ref 6–23)
CO2: 31 mEq/L (ref 19–32)
Calcium: 9 mg/dL (ref 8.4–10.5)
Chloride: 104 mEq/L (ref 96–112)
Creatinine, Ser: 0.84 mg/dL (ref 0.40–1.20)
GFR: 73.99 mL/min (ref >60.00)
Glucose, Bld: 98 mg/dL (ref 70–99)
Potassium: 4.3 mEq/L (ref 3.5–5.1)
Sodium: 140 mEq/L (ref 135–145)

## 2022-08-04 LAB — VITAMIN D 25 HYDROXY (VIT D DEFICIENCY, FRACTURES): VITD: 77.2 ng/mL (ref 30.00–100.00)

## 2022-08-04 LAB — HEMOGLOBIN A1C: Hgb A1c MFr Bld: 6.6 % — ABNORMAL HIGH (ref 4.6–6.5)

## 2022-08-04 LAB — TSH: TSH: 1.46 u[IU]/mL (ref 0.35–5.50)

## 2022-08-04 MED ORDER — PREMPRO 0.45-1.5 MG PO TABS
1.0000 | ORAL_TABLET | Freq: Every day | ORAL | 3 refills | Status: DC
Start: 2022-08-04 — End: 2023-05-06
  Filled 2022-08-04: qty 84, 84d supply, fill #0
  Filled 2022-11-17: qty 84, 84d supply, fill #1

## 2022-08-04 NOTE — Addendum Note (Signed)
Addended by: Wyvonne Lenz on: 08/04/2022 04:06 PM   Modules accepted: Orders

## 2022-08-04 NOTE — Progress Notes (Signed)
Subjective:    Patient ID: Tracy Anderson, female    DOB: 29-Jul-1961, 61 y.o.   MRN: [id number]  HPI Here for a well exam. She feels great except for one area. She went through menopause about 5 years ago, and she had hot flashes for about a year and these went away. However for the past 3 years she says that her sex drive has dropped dramatically, and having intercourse is also painful for her. Consequently she and her husband have not had sex in over a year. She asls if there is anything we can do about this. She is doing well on [facility], and she has last about 7 lbs since last December. She gets yearly mammograms.    Review of Systems  Constitutional: Negative.   HENT: Negative.    Eyes: Negative.   Respiratory: Negative.    Cardiovascular: Negative.   Gastrointestinal: Negative.   Genitourinary:  Negative for decreased urine volume, difficulty urinating, dyspareunia, dysuria, enuresis, flank pain, frequency, hematuria, pelvic pain and urgency.  Musculoskeletal: Negative.   Skin: Negative.   Neurological: Negative.  Negative for headaches.  Psychiatric/Behavioral: Negative.         Objective:   Physical Exam Constitutional:      General: She is not in acute distress.    Appearance: Normal appearance. She is well-developed.  HENT:     Head: Normocephalic and atraumatic.     Right Ear: External ear normal.     Left Ear: External ear normal.     Nose: Nose normal.     Mouth/Throat:     Pharynx: No oropharyngeal exudate.  Eyes:     General: No scleral icterus.    Conjunctiva/sclera: Conjunctivae normal.     Pupils: Pupils are equal, round, and reactive to light.  Neck:     Thyroid: No thyromegaly.     Vascular: No JVD.  Cardiovascular:     Rate and Rhythm: Normal rate and regular rhythm.     Heart sounds: Normal heart sounds. No murmur heard.    No friction rub. No gallop.  Pulmonary:     Effort: Pulmonary effort is normal. No respiratory distress.      Breath sounds: Normal breath sounds. No wheezing or rales.  Chest:     Chest wall: No tenderness.  Abdominal:     General: Bowel sounds are normal. There is no distension.     Palpations: Abdomen is soft. There is no mass.     Tenderness: There is no abdominal tenderness. There is no guarding or rebound.  Musculoskeletal:        General: No tenderness. Normal range of motion.     Cervical back: Normal range of motion and neck supple.  Lymphadenopathy:     Cervical: No cervical adenopathy.  Skin:    General: Skin is warm and dry.     Findings: No erythema or rash.  Neurological:     Mental Status: She is alert and oriented to person, place, and time.     Cranial Nerves: No cranial nerve deficit.     Motor: No abnormal muscle tone.     Coordination: Coordination normal.     Deep Tendon Reflexes: Reflexes are normal and symmetric. Reflexes normal.  Psychiatric:        Behavior: Behavior normal.        Thought Content: Thought content normal.        Judgment: Judgment normal.           Assessment &  Plan:  Well exam. We discussed diet and exercise. Get fasting labs. To help the loss of libido and the vaginal atrophy, she will start on Prempro 0.45-1.5 daily. Her HTN is well controlled so we will stop the Losartan. Recheck in 3 months.  Alysia Penna, MD

## 2022-08-05 ENCOUNTER — Encounter: Payer: Self-pay | Admitting: Family Medicine

## 2022-08-08 ENCOUNTER — Other Ambulatory Visit (HOSPITAL_COMMUNITY): Payer: Self-pay

## 2022-08-10 ENCOUNTER — Other Ambulatory Visit (HOSPITAL_COMMUNITY): Payer: Self-pay

## 2022-08-11 ENCOUNTER — Other Ambulatory Visit (HOSPITAL_COMMUNITY): Payer: Self-pay

## 2022-08-24 ENCOUNTER — Other Ambulatory Visit (HOSPITAL_COMMUNITY): Payer: Self-pay

## 2022-08-25 ENCOUNTER — Other Ambulatory Visit (HOSPITAL_COMMUNITY): Payer: Self-pay

## 2022-08-25 ENCOUNTER — Other Ambulatory Visit (HOSPITAL_BASED_OUTPATIENT_CLINIC_OR_DEPARTMENT_OTHER): Payer: Self-pay

## 2022-08-25 MED ORDER — COMIRNATY 30 MCG/0.3ML IM SUSY
PREFILLED_SYRINGE | INTRAMUSCULAR | 0 refills | Status: DC
  Filled 2022-08-25: qty 0.3, 1d supply, fill #0

## 2022-08-25 MED ORDER — CYCLOBENZAPRINE HCL 5 MG PO TABS
5.0000 mg | ORAL_TABLET | Freq: Every evening | ORAL | 0 refills | Status: DC | PRN
  Filled 2022-08-25: qty 14, 14d supply, fill #0

## 2022-08-29 ENCOUNTER — Other Ambulatory Visit (HOSPITAL_COMMUNITY): Payer: Self-pay

## 2022-09-11 ENCOUNTER — Telehealth: Payer: Self-pay

## 2022-09-11 NOTE — Telephone Encounter (Signed)
--  Caller states she tested positive for covid this morning and wants to know if she can get paxlovid. Caller states she has loose stool like diarrhea. She is also having cold symptoms. No fever. Symptoms started early Friday morning  09/09/2022 9:39:32 AM Go to ED Now Toribio Harbour, RN, Joelene Millin  Comments User: Devoria Albe, RN Date/Time Eilene Ghazi Time): 09/09/2022 9:40:22 AM Refused ED. States will go to walk-in clinic for evaluation.  Referrals GO TO FACILITY REFUSED  09/11/22 0942 - Pt states she feels like she has a "really bad cold". Saw walk-in clinic for [facility] & got a Paxlovid. States she getting better. Advised to schedule an appt if develops SOB or fever; advised on masking guidelines. Pt verb understanding of all & denies further questions or concerns.

## 2022-09-18 ENCOUNTER — Other Ambulatory Visit (HOSPITAL_COMMUNITY): Payer: Self-pay

## 2022-10-05 ENCOUNTER — Other Ambulatory Visit: Payer: Self-pay | Admitting: Family Medicine

## 2022-10-05 ENCOUNTER — Other Ambulatory Visit (HOSPITAL_COMMUNITY): Payer: Self-pay

## 2022-10-06 ENCOUNTER — Other Ambulatory Visit (HOSPITAL_COMMUNITY): Payer: Self-pay

## 2022-10-06 MED ORDER — BUPROPION HCL ER (XL) 150 MG PO TB24
150.0000 mg | ORAL_TABLET | Freq: Every day | ORAL | 0 refills | Status: DC
  Filled 2022-10-06: qty 90, 90d supply, fill #0

## 2022-10-06 MED ORDER — ATORVASTATIN CALCIUM 10 MG PO TABS
10.0000 mg | ORAL_TABLET | Freq: Every day | ORAL | 0 refills | Status: DC
  Filled 2022-10-06: qty 90, 90d supply, fill #0

## 2022-10-07 ENCOUNTER — Other Ambulatory Visit (HOSPITAL_COMMUNITY): Payer: Self-pay

## 2022-10-17 ENCOUNTER — Other Ambulatory Visit (HOSPITAL_COMMUNITY): Payer: Self-pay

## 2022-10-23 ENCOUNTER — Telehealth: Payer: Self-pay

## 2022-10-23 NOTE — Telephone Encounter (Signed)
Pt surgical clearance form and Last office noted was faxed to [facility]

## 2022-11-17 ENCOUNTER — Other Ambulatory Visit: Payer: Self-pay

## 2022-11-17 ENCOUNTER — Other Ambulatory Visit (HOSPITAL_COMMUNITY): Payer: Self-pay

## 2022-11-25 ENCOUNTER — Other Ambulatory Visit (HOSPITAL_COMMUNITY): Payer: Self-pay

## 2022-12-05 ENCOUNTER — Other Ambulatory Visit (HOSPITAL_COMMUNITY): Payer: Self-pay

## 2022-12-08 ENCOUNTER — Other Ambulatory Visit: Payer: Self-pay

## 2022-12-08 ENCOUNTER — Other Ambulatory Visit (HOSPITAL_COMMUNITY): Payer: Self-pay

## 2022-12-28 ENCOUNTER — Encounter (HOSPITAL_BASED_OUTPATIENT_CLINIC_OR_DEPARTMENT_OTHER): Payer: Self-pay | Admitting: Orthopaedic Surgery

## 2022-12-29 ENCOUNTER — Encounter (HOSPITAL_BASED_OUTPATIENT_CLINIC_OR_DEPARTMENT_OTHER)
Admission: RE | Admit: 2022-12-29 | Discharge: 2022-12-29 | Disposition: A | Discharge status: Home / Self Care | Payer: Worker's Compensation | Source: Ambulatory Visit | Attending: Orthopaedic Surgery | Admitting: Orthopaedic Surgery

## 2022-12-29 DIAGNOSIS — I4510 Unspecified right bundle-branch block: Secondary | ICD-10-CM | POA: Insufficient documentation

## 2022-12-29 DIAGNOSIS — Z01818 Encounter for other preprocedural examination: Secondary | ICD-10-CM | POA: Insufficient documentation

## 2022-12-29 LAB — BASIC METABOLIC PANEL
Anion gap: 6 (ref 5–15)
BUN: 18 mg/dL (ref 8–23)
CO2: 27 mmol/L (ref 22–32)
Calcium: 8.4 mg/dL — ABNORMAL LOW (ref 8.9–10.3)
Chloride: 106 mmol/L (ref 98–111)
Creatinine, Ser: 0.78 mg/dL (ref 0.44–1.00)
GFR, Estimated: >60 mL/min (ref >60)
Glucose, Bld: 95 mg/dL (ref 70–99)
Potassium: 4.5 mmol/L (ref 3.5–5.1)
Sodium: 139 mmol/L (ref 135–145)

## 2022-12-29 NOTE — Progress Notes (Signed)
Gave patient CHG soap with instructions and BPO cream, patient verbalized understanding.

## 2023-01-03 NOTE — Anesthesia Preprocedure Evaluation (Signed)
Anesthesia Evaluation  Patient identified by MRN, date of birth, ID band Patient awake    Reviewed: Allergy & Precautions, NPO status , Patient's Chart, lab work & pertinent test results  Airway Mallampati: I       Dental no notable dental hx.    Pulmonary former smoker   Pulmonary exam normal        Cardiovascular Normal cardiovascular exam     Neuro/Psych  PSYCHIATRIC DISORDERS Anxiety Depression    negative neurological ROS     GI/Hepatic negative GI ROS, Neg liver ROS,,,  Endo/Other  diabetes, Type 2    Renal/GU negative Renal ROS  negative genitourinary   Musculoskeletal  (+) Arthritis , Osteoarthritis,    Abdominal Normal abdominal exam  (+)   Peds  Hematology negative hematology ROS (+)   Anesthesia Other Findings   Reproductive/Obstetrics                             Anesthesia Physical Anesthesia Plan  ASA: 2  Anesthesia Plan: General   Post-op Pain Management: Regional block*   Induction: Intravenous  PONV Risk Score and Plan: 3 and Ondansetron, Midazolam and Treatment may vary due to age or medical condition  Airway Management Planned: Oral ETT  Additional Equipment: None  Intra-op Plan:   Post-operative Plan: Extubation in OR  Informed Consent: I have reviewed the patients History and Physical, chart, labs and discussed the procedure including the risks, benefits and alternatives for the proposed anesthesia with the patient or authorized representative who has indicated his/her understanding and acceptance.     Dental advisory given  Plan Discussed with: CRNA  Anesthesia Plan Comments:        Anesthesia Quick Evaluation

## 2023-01-04 ENCOUNTER — Encounter (HOSPITAL_BASED_OUTPATIENT_CLINIC_OR_DEPARTMENT_OTHER)
Admission: RE | Disposition: A | Discharge status: Home / Self Care | Payer: Self-pay | Source: Home / Self Care | Attending: Orthopaedic Surgery

## 2023-01-04 ENCOUNTER — Ambulatory Visit (HOSPITAL_BASED_OUTPATIENT_CLINIC_OR_DEPARTMENT_OTHER)
Admission: RE | Admit: 2023-01-04 | Discharge: 2023-01-04 | Disposition: A | Discharge status: Home / Self Care | Payer: Worker's Compensation | Attending: Orthopaedic Surgery | Admitting: Orthopaedic Surgery

## 2023-01-04 ENCOUNTER — Encounter (HOSPITAL_BASED_OUTPATIENT_CLINIC_OR_DEPARTMENT_OTHER): Payer: Self-pay | Admitting: Orthopaedic Surgery

## 2023-01-04 ENCOUNTER — Other Ambulatory Visit: Payer: Self-pay

## 2023-01-04 ENCOUNTER — Ambulatory Visit (HOSPITAL_BASED_OUTPATIENT_CLINIC_OR_DEPARTMENT_OTHER): Payer: Worker's Compensation | Admitting: Anesthesiology

## 2023-01-04 DIAGNOSIS — W19XXXA Unspecified fall, initial encounter: Secondary | ICD-10-CM | POA: Diagnosis not present

## 2023-01-04 DIAGNOSIS — F419 Anxiety disorder, unspecified: Secondary | ICD-10-CM | POA: Diagnosis not present

## 2023-01-04 DIAGNOSIS — S46011A Strain of muscle(s) and tendon(s) of the rotator cuff of right shoulder, initial encounter: Secondary | ICD-10-CM | POA: Insufficient documentation

## 2023-01-04 DIAGNOSIS — S43431A Superior glenoid labrum lesion of right shoulder, initial encounter: Secondary | ICD-10-CM

## 2023-01-04 DIAGNOSIS — F32A Depression, unspecified: Secondary | ICD-10-CM | POA: Diagnosis not present

## 2023-01-04 DIAGNOSIS — M7541 Impingement syndrome of right shoulder: Secondary | ICD-10-CM | POA: Insufficient documentation

## 2023-01-04 DIAGNOSIS — M19011 Primary osteoarthritis, right shoulder: Secondary | ICD-10-CM

## 2023-01-04 DIAGNOSIS — E785 Hyperlipidemia, unspecified: Secondary | ICD-10-CM | POA: Diagnosis not present

## 2023-01-04 DIAGNOSIS — M75121 Complete rotator cuff tear or rupture of right shoulder, not specified as traumatic: Secondary | ICD-10-CM | POA: Diagnosis not present

## 2023-01-04 DIAGNOSIS — M7521 Bicipital tendinitis, right shoulder: Secondary | ICD-10-CM | POA: Diagnosis not present

## 2023-01-04 DIAGNOSIS — Z87891 Personal history of nicotine dependence: Secondary | ICD-10-CM | POA: Diagnosis not present

## 2023-01-04 DIAGNOSIS — Z01818 Encounter for other preprocedural examination: Secondary | ICD-10-CM

## 2023-01-04 DIAGNOSIS — E119 Type 2 diabetes mellitus without complications: Secondary | ICD-10-CM | POA: Insufficient documentation

## 2023-01-04 DIAGNOSIS — I4510 Unspecified right bundle-branch block: Secondary | ICD-10-CM | POA: Insufficient documentation

## 2023-01-04 DIAGNOSIS — J45909 Unspecified asthma, uncomplicated: Secondary | ICD-10-CM | POA: Diagnosis not present

## 2023-01-04 HISTORY — PX: SHOULDER ARTHROSCOPY WITH SUBACROMIAL DECOMPRESSION, ROTATOR CUFF REPAIR AND BICEP TENDON REPAIR: SHX5687

## 2023-01-04 HISTORY — PX: SHOULDER ARTHROSCOPY WITH DISTAL CLAVICLE RESECTION: SHX5675

## 2023-01-04 LAB — GLUCOSE, CAPILLARY
Glucose-Capillary: 108 mg/dL — ABNORMAL HIGH (ref 70–99)
Glucose-Capillary: 111 mg/dL — ABNORMAL HIGH (ref 70–99)

## 2023-01-04 SURGERY — SHOULDER ARTHROSCOPY WITH SUBACROMIAL DECOMPRESSION, ROTATOR CUFF REPAIR AND BICEP TENDON REPAIR
Anesthesia: General | Site: Shoulder | Laterality: Right

## 2023-01-04 MED ORDER — DEXAMETHASONE SODIUM PHOSPHATE 10 MG/ML IJ SOLN
INTRAMUSCULAR | Status: AC
  Filled 2023-01-04: qty 1

## 2023-01-04 MED ORDER — LIDOCAINE 2% (20 MG/ML) 5 ML SYRINGE
INTRAMUSCULAR | Status: AC
  Filled 2023-01-04: qty 5

## 2023-01-04 MED ORDER — ONDANSETRON HCL 4 MG/2ML IJ SOLN
INTRAMUSCULAR | Status: DC | PRN
  Administered 2023-01-04: 4 mg via INTRAVENOUS

## 2023-01-04 MED ORDER — PROPOFOL 10 MG/ML IV BOLUS
INTRAVENOUS | Status: AC
  Filled 2023-01-04: qty 20

## 2023-01-04 MED ORDER — PROPOFOL 10 MG/ML IV BOLUS
INTRAVENOUS | Status: DC | PRN
  Administered 2023-01-04: 170 mg via INTRAVENOUS

## 2023-01-04 MED ORDER — ONDANSETRON HCL 4 MG/2ML IJ SOLN
INTRAMUSCULAR | Status: AC
  Filled 2023-01-04: qty 2

## 2023-01-04 MED ORDER — FENTANYL CITRATE (PF) 100 MCG/2ML IJ SOLN
INTRAMUSCULAR | Status: AC
  Filled 2023-01-04: qty 2

## 2023-01-04 MED ORDER — EPINEPHRINE PF 1 MG/ML IJ SOLN
INTRAMUSCULAR | Status: AC
  Filled 2023-01-04: qty 1

## 2023-01-04 MED ORDER — FENTANYL CITRATE (PF) 100 MCG/2ML IJ SOLN
100.0000 ug | Freq: Once | INTRAMUSCULAR | Status: AC
  Administered 2023-01-04: 100 ug via INTRAVENOUS

## 2023-01-04 MED ORDER — SODIUM CHLORIDE 0.9 % IR SOLN
Status: DC | PRN
  Administered 2023-01-04: 6000 mL

## 2023-01-04 MED ORDER — ACETAMINOPHEN 325 MG PO TABS: 325.0000 mg | ORAL_TABLET | ORAL | Status: DC | PRN

## 2023-01-04 MED ORDER — EPHEDRINE SULFATE (PRESSORS) 50 MG/ML IJ SOLN
INTRAMUSCULAR | Status: DC | PRN
  Administered 2023-01-04: 10 mg via INTRAVENOUS

## 2023-01-04 MED ORDER — TRANEXAMIC ACID-NACL 1000-0.7 MG/100ML-% IV SOLN
INTRAVENOUS | Status: AC
  Filled 2023-01-04: qty 100

## 2023-01-04 MED ORDER — ACETAMINOPHEN 500 MG PO TABS: 1000.0000 mg | ORAL_TABLET | Freq: Three times a day (TID) | ORAL | 0 refills | Status: AC

## 2023-01-04 MED ORDER — CEFAZOLIN SODIUM-DEXTROSE 2-4 GM/100ML-% IV SOLN
INTRAVENOUS | Status: AC
  Filled 2023-01-04: qty 100

## 2023-01-04 MED ORDER — MEPERIDINE HCL 25 MG/ML IJ SOLN: 6.2500 mg | INTRAMUSCULAR | Status: DC | PRN

## 2023-01-04 MED ORDER — ACETAMINOPHEN 500 MG PO TABS
ORAL_TABLET | ORAL | Status: AC
  Filled 2023-01-04: qty 2

## 2023-01-04 MED ORDER — ONDANSETRON HCL 4 MG/2ML IJ SOLN: 4.0000 mg | Freq: Once | INTRAMUSCULAR | Status: DC | PRN

## 2023-01-04 MED ORDER — MIDAZOLAM HCL 2 MG/2ML IJ SOLN
INTRAMUSCULAR | Status: AC
  Filled 2023-01-04: qty 2

## 2023-01-04 MED ORDER — ACETAMINOPHEN 10 MG/ML IV SOLN: 1000.0000 mg | Freq: Once | INTRAVENOUS | Status: DC | PRN

## 2023-01-04 MED ORDER — TRANEXAMIC ACID-NACL 1000-0.7 MG/100ML-% IV SOLN
1000.0000 mg | INTRAVENOUS | Status: AC
  Administered 2023-01-04: 1000 mg via INTRAVENOUS

## 2023-01-04 MED ORDER — FENTANYL CITRATE (PF) 100 MCG/2ML IJ SOLN
INTRAMUSCULAR | Status: DC | PRN
  Administered 2023-01-04: 50 ug via INTRAVENOUS

## 2023-01-04 MED ORDER — GABAPENTIN 300 MG PO CAPS
300.0000 mg | ORAL_CAPSULE | Freq: Once | ORAL | Status: AC
  Administered 2023-01-04: 300 mg via ORAL

## 2023-01-04 MED ORDER — GABAPENTIN 300 MG PO CAPS
ORAL_CAPSULE | ORAL | Status: AC
  Filled 2023-01-04: qty 1

## 2023-01-04 MED ORDER — KETOROLAC TROMETHAMINE 15 MG/ML IJ SOLN: 15.0000 mg | Freq: Once | INTRAMUSCULAR | Status: DC

## 2023-01-04 MED ORDER — EPHEDRINE 5 MG/ML INJ
INTRAVENOUS | Status: AC
  Filled 2023-01-04: qty 5

## 2023-01-04 MED ORDER — OXYCODONE HCL 5 MG/5ML PO SOLN: 5.0000 mg | Freq: Once | ORAL | Status: DC | PRN

## 2023-01-04 MED ORDER — ACETAMINOPHEN 500 MG PO TABS
1000.0000 mg | ORAL_TABLET | Freq: Once | ORAL | Status: AC
  Administered 2023-01-04: 1000 mg via ORAL

## 2023-01-04 MED ORDER — SODIUM CHLORIDE 0.9 % IR SOLN
Status: DC | PRN
  Administered 2023-01-04: 12000 mL

## 2023-01-04 MED ORDER — ROCURONIUM BROMIDE 10 MG/ML (PF) SYRINGE
PREFILLED_SYRINGE | INTRAVENOUS | Status: AC
  Filled 2023-01-04: qty 10

## 2023-01-04 MED ORDER — LIDOCAINE HCL (CARDIAC) PF 100 MG/5ML IV SOSY
PREFILLED_SYRINGE | INTRAVENOUS | Status: DC | PRN
  Administered 2023-01-04: 100 mg via INTRAVENOUS

## 2023-01-04 MED ORDER — MIDAZOLAM HCL 2 MG/2ML IJ SOLN
2.0000 mg | Freq: Once | INTRAMUSCULAR | Status: AC
  Administered 2023-01-04: 2 mg via INTRAVENOUS

## 2023-01-04 MED ORDER — ROCURONIUM BROMIDE 100 MG/10ML IV SOLN
INTRAVENOUS | Status: DC | PRN
  Administered 2023-01-04: 50 mg via INTRAVENOUS

## 2023-01-04 MED ORDER — CEFAZOLIN SODIUM-DEXTROSE 2-4 GM/100ML-% IV SOLN
2.0000 g | INTRAVENOUS | Status: AC
  Administered 2023-01-04: 2 g via INTRAVENOUS

## 2023-01-04 MED ORDER — FENTANYL CITRATE (PF) 100 MCG/2ML IJ SOLN: 25.0000 ug | INTRAMUSCULAR | Status: DC | PRN

## 2023-01-04 MED ORDER — PHENYLEPHRINE HCL-NACL 20-0.9 MG/250ML-% IV SOLN
INTRAVENOUS | Status: DC | PRN
  Administered 2023-01-04: 50 ug/min via INTRAVENOUS

## 2023-01-04 MED ORDER — OXYCODONE HCL 5 MG PO TABS: ORAL_TABLET | ORAL | 0 refills | Status: AC

## 2023-01-04 MED ORDER — SUGAMMADEX SODIUM 200 MG/2ML IV SOLN
INTRAVENOUS | Status: DC | PRN
  Administered 2023-01-04: 150 mg via INTRAVENOUS

## 2023-01-04 MED ORDER — LACTATED RINGERS IV SOLN: INTRAVENOUS | Status: DC

## 2023-01-04 MED ORDER — OXYCODONE HCL 5 MG PO TABS: 5.0000 mg | ORAL_TABLET | Freq: Once | ORAL | Status: DC | PRN

## 2023-01-04 MED ORDER — CELECOXIB 100 MG PO CAPS: 100.0000 mg | ORAL_CAPSULE | Freq: Two times a day (BID) | ORAL | 0 refills | Status: AC

## 2023-01-04 MED ORDER — ACETAMINOPHEN 160 MG/5ML PO SOLN: 325.0000 mg | ORAL | Status: DC | PRN

## 2023-01-04 MED ORDER — ONDANSETRON HCL 4 MG PO TABS: 4.0000 mg | ORAL_TABLET | Freq: Three times a day (TID) | ORAL | 0 refills | Status: AC | PRN

## 2023-01-04 MED ORDER — GABAPENTIN 100 MG PO CAPS: 100.0000 mg | ORAL_CAPSULE | Freq: Three times a day (TID) | ORAL | 0 refills | Status: DC

## 2023-01-04 SURGICAL SUPPLY — 57 items
AID PSTN UNV HD RSTRNT DISP (MISCELLANEOUS) ×1
ANCH SUT 2 FBRTK KNTLS 1.8 (Anchor) ×2 IMPLANT
ANCH SUT SPEEDBRIDGE 4.75 (Anchor) ×1 IMPLANT
ANCHOR SUT 1.8 FIBERTAK SB KL (Anchor) IMPLANT
APL PRP STRL LF DISP 70% ISPRP (MISCELLANEOUS) ×1
BLADE EXCALIBUR 4.0X13 (MISCELLANEOUS) ×1 IMPLANT
BURR OVAL 8 FLU 4.0X13 (MISCELLANEOUS) ×1 IMPLANT
CANNULA 5.75X71 LONG (CANNULA) IMPLANT
CANNULA PASSPORT 5 (CANNULA) IMPLANT
CANNULA PASSPORT BUTTON 10-40 (CANNULA) IMPLANT
CANNULA TWIST IN 8.25X7CM (CANNULA) IMPLANT
CHLORAPREP W/TINT 26 (MISCELLANEOUS) ×1 IMPLANT
CLSR STERI-STRIP ANTIMIC 1/2X4 (GAUZE/BANDAGES/DRESSINGS) ×1 IMPLANT
COOLER ICEMAN CLASSIC (MISCELLANEOUS) ×1 IMPLANT
DRAPE IMP U-DRAPE 54X76 (DRAPES) ×1 IMPLANT
DRAPE INCISE IOBAN 66X45 STRL (DRAPES) IMPLANT
DRAPE SHOULDER BEACH CHAIR (DRAPES) ×1 IMPLANT
DW OUTFLOW CASSETTE/TUBE SET (MISCELLANEOUS) ×1 IMPLANT
GAUZE PAD ABD 8X10 STRL (GAUZE/BANDAGES/DRESSINGS) ×1 IMPLANT
GAUZE SPONGE 4X4 12PLY STRL (GAUZE/BANDAGES/DRESSINGS) ×1 IMPLANT
GLOVE BIO SURGEON STRL SZ 6.5 (GLOVE) ×1 IMPLANT
GLOVE BIOGEL PI IND STRL 6.5 (GLOVE) ×1 IMPLANT
GLOVE BIOGEL PI IND STRL 8 (GLOVE) ×1 IMPLANT
GLOVE ECLIPSE 8.0 STRL XLNG CF (GLOVE) ×1 IMPLANT
GOWN STRL REUS W/ TWL LRG LVL3 (GOWN DISPOSABLE) ×2 IMPLANT
GOWN STRL REUS W/TWL LRG LVL3 (GOWN DISPOSABLE) ×2
GOWN STRL REUS W/TWL XL LVL3 (GOWN DISPOSABLE) ×1 IMPLANT
IMPL SPEEDBRIDGE KNOTLESS 4 (Anchor) IMPLANT
IMPLANT SPEEDBRIDGE KNOTLESS 4 (Anchor) ×1 IMPLANT
KIT STABILIZATION SHOULDER (MISCELLANEOUS) ×1 IMPLANT
KIT STR SPEAR 1.8 FBRTK DISP (KITS) IMPLANT
LASSO CRESCENT QUICKPASS (SUTURE) IMPLANT
MANIFOLD NEPTUNE II (INSTRUMENTS) ×1 IMPLANT
NDL HD SCORPION MEGA LOADER (NEEDLE) IMPLANT
NDL SAFETY ECLIP 18X1.5 (MISCELLANEOUS) ×1 IMPLANT
PACK ARTHROSCOPY DSU (CUSTOM PROCEDURE TRAY) ×1 IMPLANT
PACK BASIN DAY SURGERY FS (CUSTOM PROCEDURE TRAY) ×1 IMPLANT
PAD COLD SHLDR WRAP-ON (PAD) ×1 IMPLANT
PORT APPOLLO RF 90DEGREE MULTI (SURGICAL WAND) ×1 IMPLANT
RESTRAINT HEAD UNIVERSAL NS (MISCELLANEOUS) ×1 IMPLANT
SHEET MEDIUM DRAPE 40X70 STRL (DRAPES) ×1 IMPLANT
SLEEVE SCD COMPRESS KNEE MED (STOCKING) ×1 IMPLANT
SLING ARM FOAM STRAP LRG (SOFTGOODS) IMPLANT
SLING ULTRA III MED (ORTHOPEDIC SUPPLIES) IMPLANT
SUT FIBERWIRE #2 38 T-5 BLUE (SUTURE) ×2
SUT MNCRL AB 4-0 PS2 18 (SUTURE) ×1 IMPLANT
SUT PDS AB 1 CT  36 (SUTURE)
SUT PDS AB 1 CT 36 (SUTURE) IMPLANT
SUT TIGER TAPE 7 IN WHITE (SUTURE) IMPLANT
SUTURE FIBERWR #2 38 T-5 BLUE (SUTURE) IMPLANT
SUTURE TAPE TIGERLINK 1.3MM BL (SUTURE) IMPLANT
SUTURETAPE TIGERLINK 1.3MM BL (SUTURE) ×1
SYR 5ML LL (SYRINGE) ×1 IMPLANT
TAPE FIBER 2MM 7IN #2 BLUE (SUTURE) IMPLANT
TOWEL GREEN STERILE FF (TOWEL DISPOSABLE) ×2 IMPLANT
TUBE CONNECTING 20X1/4 (TUBING) ×1 IMPLANT
TUBING ARTHROSCOPY IRRIG 16FT (MISCELLANEOUS) ×1 IMPLANT

## 2023-01-04 NOTE — Anesthesia Postprocedure Evaluation (Signed)
Anesthesia Post Note  Patient: Tracy Anderson  Procedure(s) Performed: SHOULDER ARTHROSCOPY WITH SUBACROMIAL DECOMPRESSION, ROTATOR CUFF REPAIR AND BICEP TENDON REPAIR (Right: Shoulder) SHOULDER ARTHROSCOPY WITH DISTAL CLAVICLE EXCISION (Right: Shoulder)     Patient location during evaluation: PACU Anesthesia Type: General Level of consciousness: awake and sedated Pain management: pain level controlled Vital Signs Assessment: post-procedure vital signs reviewed and stable Respiratory status: spontaneous breathing Cardiovascular status: stable Postop Assessment: no apparent nausea or vomiting Anesthetic complications: no  No notable events documented.  Last Vitals:  Vitals:   01/04/23 1236 01/04/23 1245  BP:    Pulse: 67 65  Resp: 15 12  Temp:    SpO2: 98% 93%    Last Pain:  Vitals:   01/04/23 1245  TempSrc:   PainSc: 0-No pain                 Huston Foley

## 2023-01-04 NOTE — Transfer of Care (Signed)
Immediate Anesthesia Transfer of Care Note  Patient: Tracy Anderson  Procedure(s) Performed: SHOULDER ARTHROSCOPY WITH SUBACROMIAL DECOMPRESSION, ROTATOR CUFF REPAIR AND BICEP TENDON REPAIR (Right: Shoulder) SHOULDER ARTHROSCOPY WITH DISTAL CLAVICLE EXCISION (Right: Shoulder)  Patient Location: PACU  Anesthesia Type:GA combined with regional for post-op pain  Level of Consciousness: drowsy, patient cooperative, and responds to stimulation  Airway & Oxygen Therapy: Patient Spontanous Breathing and Patient connected to face mask oxygen  Post-op Assessment: Report given to RN and Post -op Vital signs reviewed and stable  Post vital signs: Reviewed and stable  Last Vitals:  Vitals Value Taken Time  BP    Temp    Pulse 66 01/04/23 1229  Resp 16 01/04/23 1229  SpO2 97 % 01/04/23 1229  Vitals shown include unvalidated device data.  Last Pain:  Vitals:   01/04/23 0947  TempSrc: Oral  PainSc: 0-No pain      Patients Stated Pain Goal: 3 ([id number])  Complications: No notable events documented.

## 2023-01-04 NOTE — Discharge Instructions (Addendum)
Ophelia Charter MD, MPH Noemi Chapel, PA-C [facility] [address], [address] [phone] (tel)   [phone] (fax)   POST-OPERATIVE INSTRUCTIONS - SHOULDER ARTHROSCOPY  WOUND CARE You may remove the Operative Dressing on Post-Op Day #3 (72hrs after surgery).   Alternatively if you would like you can leave dressing on until follow-up if within 7-8 days but keep it dry. Leave steri-strips in place until they fall off on their own, usually 2 weeks postop. There may be a small amount of fluid/bleeding leaking at the surgical site.  This is normal; the shoulder is filled with fluid during the procedure and can leak for 24-48hrs after surgery.  You may change/reinforce the bandage as needed.  Use the Cryocuff or Ice as often as possible for the first 7 days, then as needed for pain relief. Always keep a towel, ACE wrap or other barrier between the cooling unit and your skin.  You may shower on Post-Op Day #3. Gently pat the area dry.  Do not soak the shoulder in water or submerge it.  Keep incisions as dry as possible. Do not go swimming in the pool or ocean until 4 weeks after surgery or when otherwise instructed.    EXERCISES Wear the sling at all times  You may remove the sling for showering, but keep the arm across the chest or in a secondary sling.     It is normal for your fingers/hand to become more swollen after surgery and discolored from bruising.   This will resolve over the first few weeks usually after surgery. Please continue to ambulate and do not stay sitting or lying for too long.  Perform foot and wrist pumps to assist in circulation.  PHYSICAL THERAPY - You will begin physical therapy soon after surgery (unless otherwise specified) - Please call to set up an appointment, if you do not already have one  - Let our office if there are any issues with scheduling your therapy  - You have a physical therapy appointment scheduled at [facility] (across  the hall from our office) on Monday March 4th   REGIONAL ANESTHESIA (NERVE BLOCKS) The anesthesia team may have performed a nerve block for you this is a great tool used to minimize pain.   The block may start wearing off overnight (between 8-24 hours postop) When the block wears off, your pain may go from nearly zero to the pain you would have had postop without the block. This is an abrupt transition but nothing dangerous is happening.   This can be a challenging period but utilize your as needed pain medications to try and manage this period. We suggest you use the pain medication the first night prior to going to bed, to ease this transition.  You may take an extra dose of narcotic when this happens if needed  POST-OP MEDICATIONS- Multimodal approach to pain control In general your pain will be controlled with a combination of substances.  Prescriptions unless otherwise discussed are electronically sent to your pharmacy.  This is a carefully made plan we use to minimize narcotic use.     Celebrex - Anti-inflammatory medication taken on a scheduled basis Acetaminophen - Non-narcotic pain medicine taken on a scheduled basis  Gabapentin - this is to help with nerve based pain, take on a scheduled basis Oxycodone - This is a strong narcotic, to be used only on an "as needed" basis for SEVERE pain. Zofran - take as needed for nausea  FOLLOW-UP  If you develop a Fever (?101.5), Redness or Drainage from the surgical incision site, please call our office to arrange for an evaluation. Please call the office to schedule a follow-up appointment for your first post-operative appointment, 7-10 days post-operatively.    HELPFUL INFORMATION   You may be more comfortable sleeping in a semi-seated position the first few nights following surgery.  Keep a pillow propped under the elbow and forearm for comfort.  If you have a recliner type of chair it might be beneficial.  If not that is fine too, but  it would be helpful to sleep propped up with pillows behind your operated shoulder as well under your elbow and forearm.  This will reduce pulling on the suture lines.  When dressing, put your operative arm in the sleeve first.  When getting undressed, take your operative arm out last.  Loose fitting, button-down shirts are recommended.  Often in the first days after surgery you may be more comfortable keeping your operative arm under your shirt and not through the sleeve.  You may return to work/school in the next couple of days when you feel up to it.  Desk work and typing in the sling is fine.  We suggest you use the pain medication the first night prior to going to bed, in order to ease any pain when the anesthesia wears off. You should avoid taking pain medications on an empty stomach as it will make you nauseous.  You should wean off your narcotic medicines as soon as you are able.  Most patients will be off or using minimal narcotics before their first postop appointment.   Do not drink alcoholic beverages or take illicit drugs when taking pain medications.  It is against the law to drive while taking narcotics.  In some states it is against the law to drive while your arm is in a sling.   Pain medication may make you constipated.  Below are a few solutions to try in this order: Decrease the amount of pain medication if you aren't having pain. Drink lots of decaffeinated fluids. Drink prune juice and/or eat dried prunes  If the first 3 don't work start with additional solutions Take Colace - an over-the-counter stool softener Take Senokot - an over-the-counter laxative Take Miralax - a stronger over-the-counter laxative  For more information including helpful videos and documents visit our website:   [email or web address]-information.[email or web address]    Tylenol can be taken at 4 pm if needed  Post Anesthesia Home Care Instructions  Activity: Get plenty of rest for the  remainder of the day. A responsible individual must stay with you for 24 hours following the procedure.  For the next 24 hours, DO NOT: -Drive a car -Paediatric nurse -Drink alcoholic beverages -Take any medication unless instructed by your physician -Make any legal decisions or sign important papers.  Meals: Start with liquid foods such as gelatin or soup. Progress to regular foods as tolerated. Avoid greasy, spicy, heavy foods. If nausea and/or vomiting occur, drink only clear liquids until the nausea and/or vomiting subsides. Call your physician if vomiting continues.  Special Instructions/Symptoms: Your throat may feel dry or sore from the anesthesia or the breathing tube placed in your throat during surgery. If this causes discomfort, gargle with warm salt water. The discomfort should disappear within 24 hours.  Regional Anesthesia Blocks  1. Numbness or the inability to move the "blocked" extremity may last from 3-48 hours after placement. The length of time depends on the medication  injected and your individual response to the medication. If the numbness is not going away after 48 hours, call your surgeon.  2. The extremity that is blocked will need to be protected until the numbness is gone and the  Strength has returned. Because you cannot feel it, you will need to take extra care to avoid injury. Because it may be weak, you may have difficulty moving it or using it. You may not know what position it is in without looking at it while the block is in effect.  3. For blocks in the legs and feet, returning to weight bearing and walking needs to be done carefully. You will need to wait until the numbness is entirely gone and the strength has returned. You should be able to move your leg and foot normally before you try and bear weight or walk. You will need someone to be with you when you first try to ensure you do not fall and possibly risk injury.  4. Bruising and tenderness at the  needle site are common side effects and will resolve in a few days.  5. Persistent numbness or new problems with movement should be communicated to the surgeon or the [facility] [phone] [place] [phone])  Gerre Pebbles III (Red ball):  Please contact your surgeon if you have questions or concerns about your sling.     Information for Discharge Teaching: EXPAREL (bupivacaine liposome injectable suspension)   Your surgeon or anesthesiologist gave you EXPAREL(bupivacaine) to help control your pain after surgery.  EXPAREL is a local anesthetic that provides pain relief by numbing the tissue around the surgical site. EXPAREL is designed to release pain medication over time and can control pain for up to 72 hours. Depending on how you respond to EXPAREL, you may require less pain medication during your recovery.  Possible side effects: Temporary loss of sensation or ability to move in the area where bupivacaine was injected. Nausea, vomiting, constipation Rarely, numbness and tingling in your mouth or lips, lightheadedness, or anxiety may occur. Call your doctor right away if you think you may be experiencing any of these sensations, or if you have other questions regarding possible side effects.  Follow all other discharge instructions given to you by your surgeon or nurse. Eat a healthy diet and drink plenty of water or other fluids.  If you return to the hospital for any reason within 96 hours following the administration of EXPAREL, it is important for health care providers to know that you have received this anesthetic. A teal colored band has been placed on your arm with the date, time and amount of EXPAREL you have received in order to alert and inform your health care providers. Please leave this armband in place for the full 96 hours following administration, and then you may remove the band.

## 2023-01-04 NOTE — Interval H&P Note (Signed)
All questions answered, patient wants to proceed with procedure.  

## 2023-01-04 NOTE — Anesthesia Procedure Notes (Signed)
Procedure Name: Intubation Date/Time: 01/04/2023 11:05 AM  Performed by: Glory Buff, CRNAPre-anesthesia Checklist: Patient identified, Emergency Drugs available, Suction available and Patient being monitored Patient Re-evaluated:Patient Re-evaluated prior to induction Oxygen Delivery Method: Circle system utilized Preoxygenation: Pre-oxygenation with 100% oxygen Induction Type: IV induction Ventilation: Mask ventilation without difficulty Laryngoscope Size: Miller and 3 Grade View: Grade I Tube type: Oral Tube size: 7.0 mm Number of attempts: 1 Airway Equipment and Method: Stylet and Oral airway Placement Confirmation: ETT inserted through vocal cords under direct vision, positive ETCO2 and breath sounds checked- equal and bilateral Secured at: 21 cm Tube secured with: Tape Dental Injury: Teeth and Oropharynx as per pre-operative assessment

## 2023-01-04 NOTE — H&P (Signed)
PREOPERATIVE H&P  Chief Complaint: right soulder cartilage disorder, OA, impingement syndrome,biceps tendinitis, rotator cuff tear  HPI: Tracy Anderson is a 62 y.o. female who is scheduled for, Procedure(s): SHOULDER ARTHROSCOPY WITH SUBACROMIAL DECOMPRESSION, ROTATOR CUFF REPAIR AND BICEP TENDON REPAIR SHOULDER ARTHROSCOPY WITH DISTAL CLAVICLE EXCISION.   Patient has a past medical history significant for asthma, DM type 2, HLD.   Patient is a 62 year-old female who was sent to [place] by Dr. Sheppard Coil.  She had a work related injury.  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  She fell while crossing the street.  She has tried and failed physical therapy and injections.  She has not made much progress.  She still has an inability to raise her hand overhead regularly.  She has had trouble with sleep.  The injection seems to have worn off.    Symptoms are rated as moderate to severe, and have been worsening.  This is significantly impairing activities of daily living.    Please see clinic note for further details on this patient's care.    She has elected for surgical management.   Past Medical History:  Diagnosis Date   Arthritis    Asthma    as a child   Atypical mole 01/25/2018   mild atypia on left inner thigh   Back pain    Basal cell carcinoma 12/20/2018   nodular on right nose - MOHs   Depression    Diabetes mellitus type II    Herpes genitalia    Hyperlipidemia    Shellfish allergy    Shingles 2012   right flank   Past Surgical History:  Procedure Laterality Date   CHOLECYSTECTOMY N/A 07/05/2016   Procedure: LAPAROSCOPIC CHOLECYSTECTOMY;  Surgeon: Georganna Skeans, MD;  Location: [place];  Service: General;  Laterality: N/A;   colonscopy  02/26/2020   per Dr. Silverio Decamp, adenomatous polyp, repeat in 7 yrs    TYMPANOSTOMY TUBE PLACEMENT     Social History   Socioeconomic History   Marital status: Married    Spouse name: Francee Piccolo   Number of  children: Not on file   Years of education: Not on file   Highest education level: 12th grade  Occupational History   Occupation: IT consultant  Tobacco Use   Smoking status: Former    Types: Cigarettes    Quit date: 05/05/1991    Years since quitting: 31.6   Smokeless tobacco: Never  Vaping Use   Vaping Use: Never used  Substance and Sexual Activity   Alcohol use: Yes    Alcohol/week: 2.0 standard drinks of alcohol    Types: 2 Standard drinks or equivalent per week    Comment: occ   Drug use: No   Sexual activity: Not on file  Other Topics Concern   Not on file  Social History Narrative   Not on file   Social Determinants of Health   Financial Resource Strain: Low Risk  (10/06/2021)   Overall Financial Resource Strain (CARDIA)    Difficulty of Paying Living Expenses: Not very hard  Food Insecurity: No Food Insecurity (10/06/2021)   Hunger Vital Sign    Worried About Running Out of Food in the Last Year: Never true    Ran Out of Food in the Last Year: Never true  Transportation Needs: No Transportation Needs (10/06/2021)   PRAPARE - Hydrologist (Medical): No    Lack of Transportation (Non-Medical): No  Physical Activity: Unknown (10/06/2021)   Exercise Vital Sign    Days of Exercise per Week: Patient refused    Minutes of Exercise per Session: Not on file  Stress: No Stress Concern Present (10/06/2021)   [place]    Feeling of Stress : Not at all  Social Connections: [place] (10/06/2021)   Social Connection and Isolation Panel [NHANES]    Frequency of Communication with Friends and Family: More than three times a week    Frequency of Social Gatherings with Friends and Family: Once a week    Attends Religious Services: More than 4 times per year    Active Member of [employer] or Organizations: Yes    Attends Music therapist: More than 4 times per year     Marital Status: Married   Family History  Problem Relation Age of Onset   Arthritis Other    Cancer Other        cervical   Depression Other    Diabetes Other    Ulcers Other    Breast cancer Maternal Grandmother    Diabetes Mother    Heart disease Mother    Cancer Mother    Depression Mother    Alcoholism Mother    Eating disorder Mother    Diabetes Father    Hypertension Father    Depression Father    Alcoholism Father    Colon polyps Neg Hx    Esophageal cancer Neg Hx    Stomach cancer Neg Hx    Rectal cancer Neg Hx    Allergies  Allergen Reactions   Lisinopril Cough   Shrimp [Shellfish Allergy] Diarrhea    Severe stomach cramps and diarrhea   Clarithromycin Other (See Comments)    UNSPECIFIED    Prior to Admission medications   Medication Sig Start Date End Date Taking? Authorizing Provider  buPROPion (WELLBUTRIN XL) 150 MG 24 hr tablet Take 1 tablet (150 mg total) by mouth daily. 10/06/22  Yes Laurey Morale, MD  Vitamin D, Ergocalciferol, (DRISDOL) 1.25 MG (50000 UNIT) CAPS capsule Take 1 capsule (50,000 Units total) by mouth every 7 (seven) days. 06/20/22  Yes Laurey Morale, MD  Blood Glucose Monitoring Suppl (FREESTYLE LITE) w/Device KIT Use to check blood sugar 3 times a day 08/12/21   Laurey Morale, MD  COVID-19 mRNA bivalent vaccine, Pfizer, (PFIZER COVID-19 VAC BIVALENT) injection Inject into the muscle. 08/16/21   Carlyle Basques, MD  COVID-19 mRNA vaccine [phone] (COMIRNATY) syringe Inject into the muscle. 08/25/22     cyclobenzaprine (FLEXERIL) 5 MG tablet Take 1 tablet (5 mg total) by mouth at bedtime as needed. 08/25/22     estrogen, conjugated,-medroxyprogesterone (PREMPRO) 0.45-1.5 MG tablet Take 1 tablet by mouth daily. 08/04/22   Laurey Morale, MD  glucose blood (FREESTYLE LITE) test strip Use to check blood sugar 3 times a day 08/12/21   Laurey Morale, MD  Lancets (FREESTYLE) lancets Use to check blood sugar 3 times a day 08/12/21   Laurey Morale, MD   tirzepatide [facility]) 10 MG/0.5ML Pen Inject 10 mg into the skin once a week. 05/31/22   Laurey Morale, MD    ROS: All other systems have been reviewed and were otherwise negative with the exception of those mentioned in the HPI and as above.  Physical Exam: General: Alert, no acute distress Cardiovascular: No pedal edema Respiratory: No cyanosis, no use of accessory musculature GI: No organomegaly, abdomen  is soft and non-tender Skin: No lesions in the area of chief complaint Neurologic: Sensation intact distally Psychiatric: Patient is competent for consent with normal mood and affect Lymphatic: No axillary or cervical lymphadenopathy  MUSCULOSKELETAL:  Active range of motion to 90, passive to 120, limited by pain.  Positive AC tenderness to palpation, impingement and O'Brien's.  She has a positive drop arm test.  She has significant pain with cuff testing.  Weakness with supraspinatus, infraspinatus and subscapularis.  Imaging: MRI demonstrates a full thickness supraspinatus tear involving the infraspinatus.  There is potentially some subscapularis involvement.  Fluid around the biceps.  Type II acromion.  AC arthrosis.  Assessment: right soulder cartilage disorder, OA, impingement syndrome,biceps tendinitis, rotator cuff tear  Plan: Plan for Procedure(s): SHOULDER ARTHROSCOPY WITH SUBACROMIAL DECOMPRESSION, ROTATOR CUFF REPAIR AND BICEP TENDON REPAIR SHOULDER ARTHROSCOPY WITH DISTAL CLAVICLE EXCISION  The risks benefits and alternatives were discussed with the patient including but not limited to the risks of nonoperative treatment, versus surgical intervention including infection, bleeding, nerve injury,  blood clots, cardiopulmonary complications, morbidity, mortality, among others, and they were willing to proceed.   The patient acknowledged the explanation, agreed to proceed with the plan and consent was signed.   Operative Plan: Right shoulder scope with RCR, BT, DCE,  SAD Discharge Medications: standard  DVT Prophylaxis: none Physical Therapy: outpatient PT Special Discharge needs: Sling. [place], PA-C  01/04/2023 6:52 AM

## 2023-01-04 NOTE — Progress Notes (Signed)
Assisted Dr. Jillyn Hidden with right, interscalene , ultrasound guided block. Side rails up, monitors on throughout procedure. See vital signs in flow sheet. Tolerated Procedure well.

## 2023-01-04 NOTE — Op Note (Signed)
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  POST-OPERATIVE DIAGNOSIS: same  PROCEDURE: Arthroscopic extensive debridement - 29823 Subdeltoid Bursa, Supraspinatus Tendon, Anterior Labrum, Superior Labrum, Posterior Labrum, and glenoid bone, glenoid cartilage, humeral bone and humeral cartilage Arthroscopic distal clavicle excision [id number] Arthroscopic subacromial decompression [id number] Arthroscopic rotator cuff repair [id number] Arthroscopic biceps tenodesis [id number]   OPERATIVE FINDING: Exam under anesthesia: Normal Articular space: Anterior and posterior labral tearing debrided back Chondral surfaces: Normal Biceps:  Type II SLAP tear Subscapularis: Intact  Supraspinatus: Complete tear  Infraspinatus: Complete tear   There is a likely acute on chronic mechanism and the patient had a tear that was assisted with a marginal convergence type passage of her sutures.  We had good tendon to bone apposition.  3 x 2 repair was completed.   Post-operative plan: The patient will be non-weightbearing in a sling for 6 weeks.  The patient will be discharged home.  DVT prophylaxis not indicated in ambulatory upper extremity patient without known risk factors.   Pain control with PRN pain medication preferring oral medicines.  Follow up plan will be scheduled in approximately 7 days for incision check and XR.  Surgeons:Primary: Hiram Gash, MD Assistants:Caroline McBane PA-C Location: [facility] OR ROOM 6 Anesthesia: General with Exparel interscalene block Antibiotics: Ancef 2 g Tourniquet time: None Estimated Blood Loss: Minimal Complications: None Specimens: None Implants: Implant Name Type Inv. Item Serial No. Manufacturer Lot No. LRB No. Used Action  ANCHOR SUT 1.8 FIBERTAK SB KL -  [id number] Anchor ANCHOR SUT 1.8 FIBERTAK SB KL  [employer] [id number] Right 1 Implanted  ANCHOR SUT 1.8 FIBERTAK SB KL - [id number] Anchor ANCHOR SUT 1.8 FIBERTAK SB KL  [employer] [id number] Right 1 Implanted  IMPLANT SPEEDBRIDGE KNOTLESS 4 - [id number] Anchor IMPLANT SPEEDBRIDGE KNOTLESS 4  [employer] [id number] Right 1 Implanted    Indications for Surgery:   Tracy Anderson is a 61 y.o. female with continued shoulder pain refractory to nonoperative measures for extended period of time.    The risks and benefits were explained at length including but not limited to continued pain, cuff failure, biceps tenodesis failure, stiffness, need for further surgery and infection.   Procedure:   Patient was correctly identified in the preoperative holding area and operative site marked.  Patient brought to OR and positioned beachchair on an Hartman table ensuring that all bony prominences were padded and the head was in an appropriate location.  Anesthesia was induced and the operative shoulder was prepped and draped in the usual sterile fashion.  Timeout was called preincision.  A standard posterior viewing portal was made after localizing the portal with a spinal needle.  An anterior accessory portal was also made.  After clearing the articular space the camera was positioned in the subacromial space.  Findings above.    Extensive debridement was performed of the anterior interval tissue, labral fraying and the bursa.  Subacromial decompression: We made a lateral portal with spinal needle guidance. We then proceeded to debride bursal tissue extensively with a shaver and arthrocare device. At that point we continued to identify the borders of the acromion and identify the spur. We then carefully preserved the deltoid fascia and used a burr to convert the acromion to a Type 1 flat acromion without issue.  Biceps tenodesis: We  marked the tendon and then performed a tenotomy and debridement of the stump  in the articular space. We then identified the biceps tendon in its groove suprapec with the arthroscope in the lateral portal taking care to move from lateral to medial to avoid injury to the subscapularis. At that point we unroofed the tendon itself and mobilized it. An accessory anterior portal was made in line with the tendon and we grasped it from the anterior superior portal and worked from the accessory anterior portal. Two Fibertak 1.71m knotless anchors were placed in the groove and the tendon was secured in a luggage loop style fashion with a pass of the limb of suture through the tendon using a scorpion device to avoid pull-through.  Repair was completed with good tension on the tendon.  Residual stump of the tendon was removed after being resected with a RF ablator.  Distal Clavicle resection:  The scope was placed in the subacromial space from the posterior portal.  A hemostat was placed through the anterior portal and [facility].  A burr was then inserted and 10 mm of distal clavicle was resected taking care to avoid damage to the capsule around the joint and avoiding overhanging bone posteriorly.    Arthroscopic rotator cuff repair: Once the above is complete we used a shaver as well as a bur to prepare the tuberosity and clear soft tissue so that there was bony bleeding and appropriate bloody flecks for healing.  We then placed 3 Medial Row 2.6 mm fiber tack anchors with knotless mechanisms and tape.  We used a scorpion as well as a link suture to pass all of the sutures from each anchor through the cuff.  We then were able to use the knotless mechanism sutures to perform a double medial row tiedown compressing the medial row without overtensioning.  Once this was complete we cut the switch sutures and performed a speed bridge type repair pulling the tapes over to 2 lateral row 4.75 mm bio composite swivel locks.  This provided compression of the cuff to the tuberosity.   The  incisions were closed with absorbable monocryl and steri strips.  A sterile dressing was placed along with a sling. The patient was awoken from general anesthesia and taken to the PACU in stable condition without complication.   CNoemi Chapel PA-C, present and scrubbed throughout the case, critical for completion in a timely fashion, and for retraction, instrumentation, closure.

## 2023-01-05 ENCOUNTER — Encounter (HOSPITAL_BASED_OUTPATIENT_CLINIC_OR_DEPARTMENT_OTHER): Payer: Self-pay | Admitting: Orthopaedic Surgery

## 2023-01-12 ENCOUNTER — Other Ambulatory Visit: Payer: Self-pay | Admitting: Family Medicine

## 2023-01-12 ENCOUNTER — Other Ambulatory Visit (HOSPITAL_COMMUNITY): Payer: Self-pay

## 2023-01-12 ENCOUNTER — Other Ambulatory Visit: Payer: Self-pay

## 2023-01-12 MED ORDER — BUPROPION HCL ER (XL) 150 MG PO TB24
150.0000 mg | ORAL_TABLET | Freq: Every day | ORAL | 1 refills | Status: DC
  Filled 2023-01-12: qty 90, 90d supply, fill #0
  Filled 2023-04-14: qty 90, 90d supply, fill #1

## 2023-01-15 ENCOUNTER — Other Ambulatory Visit (HOSPITAL_COMMUNITY): Payer: Self-pay

## 2023-02-08 ENCOUNTER — Encounter: Payer: Self-pay | Admitting: Family Medicine

## 2023-02-09 NOTE — Telephone Encounter (Signed)
Make an in person OV so we can catch up

## 2023-02-16 ENCOUNTER — Ambulatory Visit (INDEPENDENT_AMBULATORY_CARE_PROVIDER_SITE_OTHER): Payer: No Typology Code available for payment source | Admitting: Family Medicine

## 2023-02-16 ENCOUNTER — Other Ambulatory Visit (HOSPITAL_COMMUNITY): Payer: Self-pay

## 2023-02-16 ENCOUNTER — Other Ambulatory Visit: Payer: Self-pay

## 2023-02-16 ENCOUNTER — Encounter: Payer: Self-pay | Admitting: Family Medicine

## 2023-02-16 VITALS — BP 132/80 | HR 65 | Temp 97.7°F | Wt 166.6 lb

## 2023-02-16 DIAGNOSIS — E669 Obesity, unspecified: Secondary | ICD-10-CM | POA: Diagnosis not present

## 2023-02-16 DIAGNOSIS — Z6830 Body mass index (BMI) 30.0-30.9, adult: Secondary | ICD-10-CM | POA: Diagnosis not present

## 2023-02-16 DIAGNOSIS — E119 Type 2 diabetes mellitus without complications: Secondary | ICD-10-CM

## 2023-02-16 LAB — POCT GLYCOSYLATED HEMOGLOBIN (HGB A1C): Hemoglobin A1C: 6 % — AB (ref 4.0–5.6)

## 2023-02-16 MED ORDER — TIRZEPATIDE 12.5 MG/0.5ML ~~LOC~~ SOAJ
12.5000 mg | SUBCUTANEOUS | 5 refills | Status: DC
  Filled 2023-02-16: qty 2, 28d supply, fill #0
  Filled 2023-03-16: qty 2, 28d supply, fill #1
  Filled 2023-04-14: qty 2, 28d supply, fill #2

## 2023-02-16 MED ORDER — ATORVASTATIN CALCIUM 10 MG PO TABS: 10.0000 mg | ORAL_TABLET | Freq: Every day | ORAL | 3 refills | Status: DC

## 2023-02-16 NOTE — Progress Notes (Signed)
   Subjective:    Patient ID: Tracy Anderson, female    DOB: 06-21-1961, 62 y.o.   MRN: [id number]  HPI Here to follow up on weight management and diabetes. She feels great. Her A1c today is 6.0%. She has been taking 10 mg weekly of Mounjaro and she asks to try the next dose up.    Review of Systems  Constitutional: Negative.   Respiratory: Negative.    Cardiovascular: Negative.        Objective:   Physical Exam Constitutional:      Appearance: Normal appearance.  Cardiovascular:     Rate and Rhythm: Normal rate and regular rhythm.     Pulses: Normal pulses.     Heart sounds: Normal heart sounds.  Pulmonary:     Effort: Pulmonary effort is normal.     Breath sounds: Normal breath sounds.  Neurological:     Mental Status: She is alert.           Assessment & Plan:  Her diabetes is well controlled, and we agreed to incree the Mounjaro to 12.5 mg weekly. Recheck at her well exam in 6 months.  Gershon Crane, MD

## 2023-02-16 NOTE — Addendum Note (Signed)
Addended by: Carola Rhine on: 02/16/2023 02:20 PM   Modules accepted: Orders

## 2023-03-16 ENCOUNTER — Other Ambulatory Visit (HOSPITAL_COMMUNITY): Payer: Self-pay

## 2023-03-16 ENCOUNTER — Other Ambulatory Visit: Payer: Self-pay

## 2023-04-14 ENCOUNTER — Other Ambulatory Visit (HOSPITAL_COMMUNITY): Payer: Self-pay

## 2023-04-16 ENCOUNTER — Other Ambulatory Visit: Payer: Self-pay

## 2023-05-01 ENCOUNTER — Emergency Department (HOSPITAL_BASED_OUTPATIENT_CLINIC_OR_DEPARTMENT_OTHER): Payer: No Typology Code available for payment source

## 2023-05-01 ENCOUNTER — Other Ambulatory Visit: Payer: Self-pay

## 2023-05-01 ENCOUNTER — Inpatient Hospital Stay (HOSPITAL_BASED_OUTPATIENT_CLINIC_OR_DEPARTMENT_OTHER)
Admission: EM | Admit: 2023-05-01 | Discharge: 2023-05-06 | DRG: 872 | Disposition: A | Discharge status: Home / Self Care | Payer: No Typology Code available for payment source | Attending: Internal Medicine | Admitting: Internal Medicine

## 2023-05-01 ENCOUNTER — Encounter (HOSPITAL_BASED_OUTPATIENT_CLINIC_OR_DEPARTMENT_OTHER): Payer: Self-pay

## 2023-05-01 DIAGNOSIS — Z833 Family history of diabetes mellitus: Secondary | ICD-10-CM

## 2023-05-01 DIAGNOSIS — M5432 Sciatica, left side: Secondary | ICD-10-CM | POA: Diagnosis not present

## 2023-05-01 DIAGNOSIS — E871 Hypo-osmolality and hyponatremia: Secondary | ICD-10-CM | POA: Insufficient documentation

## 2023-05-01 DIAGNOSIS — N179 Acute kidney failure, unspecified: Secondary | ICD-10-CM | POA: Diagnosis not present

## 2023-05-01 DIAGNOSIS — D6959 Other secondary thrombocytopenia: Secondary | ICD-10-CM | POA: Diagnosis not present

## 2023-05-01 DIAGNOSIS — Z818 Family history of other mental and behavioral disorders: Secondary | ICD-10-CM

## 2023-05-01 DIAGNOSIS — M48061 Spinal stenosis, lumbar region without neurogenic claudication: Secondary | ICD-10-CM | POA: Diagnosis not present

## 2023-05-01 DIAGNOSIS — E119 Type 2 diabetes mellitus without complications: Secondary | ICD-10-CM

## 2023-05-01 DIAGNOSIS — R7989 Other specified abnormal findings of blood chemistry: Secondary | ICD-10-CM | POA: Diagnosis not present

## 2023-05-01 DIAGNOSIS — D6489 Other specified anemias: Secondary | ICD-10-CM | POA: Diagnosis not present

## 2023-05-01 DIAGNOSIS — Z91013 Allergy to seafood: Secondary | ICD-10-CM

## 2023-05-01 DIAGNOSIS — I700 Atherosclerosis of aorta: Secondary | ICD-10-CM | POA: Diagnosis not present

## 2023-05-01 DIAGNOSIS — N12 Tubulo-interstitial nephritis, not specified as acute or chronic: Secondary | ICD-10-CM | POA: Diagnosis present

## 2023-05-01 DIAGNOSIS — M47816 Spondylosis without myelopathy or radiculopathy, lumbar region: Secondary | ICD-10-CM | POA: Diagnosis not present

## 2023-05-01 DIAGNOSIS — Z79899 Other long term (current) drug therapy: Secondary | ICD-10-CM | POA: Diagnosis not present

## 2023-05-01 DIAGNOSIS — M6088 Other myositis, other site: Secondary | ICD-10-CM | POA: Diagnosis present

## 2023-05-01 DIAGNOSIS — E861 Hypovolemia: Secondary | ICD-10-CM | POA: Diagnosis not present

## 2023-05-01 DIAGNOSIS — A4151 Sepsis due to Escherichia coli [E. coli]: Principal | ICD-10-CM | POA: Diagnosis present

## 2023-05-01 DIAGNOSIS — D259 Leiomyoma of uterus, unspecified: Secondary | ICD-10-CM | POA: Diagnosis present

## 2023-05-01 DIAGNOSIS — E785 Hyperlipidemia, unspecified: Secondary | ICD-10-CM | POA: Diagnosis present

## 2023-05-01 DIAGNOSIS — E1165 Type 2 diabetes mellitus with hyperglycemia: Secondary | ICD-10-CM | POA: Diagnosis present

## 2023-05-01 DIAGNOSIS — F411 Generalized anxiety disorder: Secondary | ICD-10-CM | POA: Diagnosis not present

## 2023-05-01 DIAGNOSIS — Z888 Allergy status to other drugs, medicaments and biological substances status: Secondary | ICD-10-CM

## 2023-05-01 DIAGNOSIS — Z8249 Family history of ischemic heart disease and other diseases of the circulatory system: Secondary | ICD-10-CM

## 2023-05-01 DIAGNOSIS — Z85828 Personal history of other malignant neoplasm of skin: Secondary | ICD-10-CM

## 2023-05-01 DIAGNOSIS — Z881 Allergy status to other antibiotic agents status: Secondary | ICD-10-CM

## 2023-05-01 DIAGNOSIS — Z7985 Long-term (current) use of injectable non-insulin antidiabetic drugs: Secondary | ICD-10-CM

## 2023-05-01 DIAGNOSIS — A419 Sepsis, unspecified organism: Secondary | ICD-10-CM

## 2023-05-01 DIAGNOSIS — Z87891 Personal history of nicotine dependence: Secondary | ICD-10-CM

## 2023-05-01 DIAGNOSIS — Z9049 Acquired absence of other specified parts of digestive tract: Secondary | ICD-10-CM

## 2023-05-01 DIAGNOSIS — R6520 Severe sepsis without septic shock: Secondary | ICD-10-CM | POA: Diagnosis present

## 2023-05-01 DIAGNOSIS — E876 Hypokalemia: Secondary | ICD-10-CM | POA: Diagnosis not present

## 2023-05-01 DIAGNOSIS — F32A Depression, unspecified: Secondary | ICD-10-CM | POA: Diagnosis present

## 2023-05-01 DIAGNOSIS — E872 Acidosis, unspecified: Secondary | ICD-10-CM

## 2023-05-01 DIAGNOSIS — Z803 Family history of malignant neoplasm of breast: Secondary | ICD-10-CM

## 2023-05-01 DIAGNOSIS — E8809 Other disorders of plasma-protein metabolism, not elsewhere classified: Secondary | ICD-10-CM | POA: Diagnosis present

## 2023-05-01 DIAGNOSIS — R109 Unspecified abdominal pain: Secondary | ICD-10-CM | POA: Diagnosis present

## 2023-05-01 LAB — PROTIME-INR
INR: 1.4 — ABNORMAL HIGH (ref 0.8–1.2)
Prothrombin Time: 17.4 seconds — ABNORMAL HIGH (ref 11.4–15.2)

## 2023-05-01 LAB — APTT: aPTT: 28 seconds (ref 24–36)

## 2023-05-01 LAB — CBC
HCT: 36.2 % (ref 36.0–46.0)
Hemoglobin: 12.4 g/dL (ref 12.0–15.0)
MCH: 28.9 pg (ref 26.0–34.0)
MCHC: 34.3 g/dL (ref 30.0–36.0)
MCV: 84.4 fL (ref 80.0–100.0)
Platelets: 179 10*3/uL (ref 150–400)
RBC: 4.29 MIL/uL (ref 3.87–5.11)
RDW: 12.9 % (ref 11.5–15.5)
WBC: 28.5 10*3/uL — ABNORMAL HIGH (ref 4.0–10.5)
nRBC: 0 % (ref 0.0–0.2)

## 2023-05-01 LAB — COMPREHENSIVE METABOLIC PANEL
ALT: 29 U/L (ref 0–44)
AST: 18 U/L (ref 15–41)
Albumin: 4 g/dL (ref 3.5–5.0)
Alkaline Phosphatase: 66 U/L (ref 38–126)
Anion gap: 13 (ref 5–15)
BUN: 28 mg/dL — ABNORMAL HIGH (ref 8–23)
CO2: 21 mmol/L — ABNORMAL LOW (ref 22–32)
Calcium: 9.1 mg/dL (ref 8.9–10.3)
Chloride: 94 mmol/L — ABNORMAL LOW (ref 98–111)
Creatinine, Ser: 2.06 mg/dL — ABNORMAL HIGH (ref 0.44–1.00)
GFR, Estimated: 26 mL/min — ABNORMAL LOW (ref >60)
Glucose, Bld: 216 mg/dL — ABNORMAL HIGH (ref 70–99)
Potassium: 3.7 mmol/L (ref 3.5–5.1)
Sodium: 128 mmol/L — ABNORMAL LOW (ref 135–145)
Total Bilirubin: 1.1 mg/dL (ref 0.3–1.2)
Total Protein: 7 g/dL (ref 6.5–8.1)

## 2023-05-01 LAB — LACTIC ACID, PLASMA
Lactic Acid, Venous: 2.1 mmol/L (ref 0.5–1.9)
Lactic Acid, Venous: 1.8 mmol/L (ref 0.5–1.9)
Lactic Acid, Venous: 1.8 mmol/L (ref 0.5–1.9)

## 2023-05-01 LAB — URINALYSIS, ROUTINE W REFLEX MICROSCOPIC
Bilirubin Urine: NEGATIVE
Glucose, UA: NEGATIVE mg/dL
Ketones, ur: NEGATIVE mg/dL
Nitrite: NEGATIVE
Protein, ur: 100 mg/dL — AB
Specific Gravity, Urine: 1.016 (ref 1.005–1.030)
WBC, UA: >50 WBC/hpf (ref 0–5)
pH: 5.5 (ref 5.0–8.0)

## 2023-05-01 LAB — PROCALCITONIN: Procalcitonin: 24.6 ng/mL

## 2023-05-01 MED ORDER — SODIUM CHLORIDE 0.9 % IV SOLN
2.0000 g | Freq: Every day | INTRAVENOUS | Status: DC
  Administered 2023-05-01 – 2023-05-02 (×2): 2 g via INTRAVENOUS
  Filled 2023-05-01 (×2): qty 12.5

## 2023-05-01 MED ORDER — ACETAMINOPHEN 650 MG RE SUPP: 650.0000 mg | Freq: Four times a day (QID) | RECTAL | Status: DC | PRN

## 2023-05-01 MED ORDER — POLYETHYLENE GLYCOL 3350 17 G PO PACK: 17.0000 g | PACK | Freq: Every day | ORAL | Status: DC | PRN

## 2023-05-01 MED ORDER — HEPARIN SODIUM (PORCINE) 5000 UNIT/ML IJ SOLN
5000.0000 [IU] | Freq: Three times a day (TID) | INTRAMUSCULAR | Status: DC
  Administered 2023-05-01 – 2023-05-03 (×5): 5000 [IU] via SUBCUTANEOUS
  Filled 2023-05-01 (×5): qty 1

## 2023-05-01 MED ORDER — LACTATED RINGERS IV BOLUS
2000.0000 mL | Freq: Once | INTRAVENOUS | Status: AC
  Administered 2023-05-01: 2000 mL via INTRAVENOUS

## 2023-05-01 MED ORDER — SODIUM CHLORIDE 0.9 % IV SOLN
2.0000 g | Freq: Once | INTRAVENOUS | Status: AC
  Administered 2023-05-01: 2 g via INTRAVENOUS
  Filled 2023-05-01: qty 20

## 2023-05-01 MED ORDER — ONDANSETRON HCL 4 MG/2ML IJ SOLN: 4.0000 mg | Freq: Four times a day (QID) | INTRAMUSCULAR | Status: DC | PRN

## 2023-05-01 MED ORDER — SODIUM CHLORIDE 0.9 % IV SOLN: 250.0000 mL | INTRAVENOUS | Status: DC | PRN

## 2023-05-01 MED ORDER — INSULIN ASPART 100 UNIT/ML IJ SOLN
0.0000 [IU] | Freq: Three times a day (TID) | INTRAMUSCULAR | Status: DC
  Administered 2023-05-02 – 2023-05-05 (×3): 1 [IU] via SUBCUTANEOUS

## 2023-05-01 MED ORDER — PANTOPRAZOLE SODIUM 40 MG IV SOLR
40.0000 mg | Freq: Once | INTRAVENOUS | Status: AC
  Administered 2023-05-01: 40 mg via INTRAVENOUS
  Filled 2023-05-01: qty 10

## 2023-05-01 MED ORDER — LACTATED RINGERS IV SOLN: INTRAVENOUS | Status: DC

## 2023-05-01 MED ORDER — SODIUM CHLORIDE 0.9 % IV SOLN
150.0000 mL/h | INTRAVENOUS | Status: AC
  Administered 2023-05-01 – 2023-05-02 (×3): 150 mL/h via INTRAVENOUS

## 2023-05-01 MED ORDER — SODIUM CHLORIDE 0.9 % IV SOLN
1.0000 g | Freq: Once | INTRAVENOUS | Status: DC
  Filled 2023-05-01: qty 10

## 2023-05-01 MED ORDER — ACETAMINOPHEN 325 MG PO TABS
650.0000 mg | ORAL_TABLET | Freq: Four times a day (QID) | ORAL | Status: DC | PRN
  Administered 2023-05-01 – 2023-05-05 (×10): 650 mg via ORAL
  Filled 2023-05-01 (×11): qty 2

## 2023-05-01 MED ORDER — SODIUM CHLORIDE 0.9% FLUSH
3.0000 mL | Freq: Two times a day (BID) | INTRAVENOUS | Status: DC
  Administered 2023-05-01 – 2023-05-06 (×10): 3 mL via INTRAVENOUS

## 2023-05-01 MED ORDER — ATORVASTATIN CALCIUM 10 MG PO TABS: 10.0000 mg | ORAL_TABLET | Freq: Every day | ORAL | Status: DC

## 2023-05-01 MED ORDER — ONDANSETRON HCL 4 MG PO TABS: 4.0000 mg | ORAL_TABLET | Freq: Four times a day (QID) | ORAL | Status: DC | PRN

## 2023-05-01 MED ORDER — BUPROPION HCL ER (XL) 150 MG PO TB24
150.0000 mg | ORAL_TABLET | Freq: Every day | ORAL | Status: DC
  Administered 2023-05-02 – 2023-05-06 (×5): 150 mg via ORAL
  Filled 2023-05-01 (×5): qty 1

## 2023-05-01 MED ORDER — SODIUM CHLORIDE 0.9% FLUSH: 3.0000 mL | INTRAVENOUS | Status: DC | PRN

## 2023-05-01 MED ORDER — HYDRALAZINE HCL 20 MG/ML IJ SOLN: 10.0000 mg | Freq: Four times a day (QID) | INTRAMUSCULAR | Status: DC | PRN

## 2023-05-01 MED ORDER — CYCLOBENZAPRINE HCL 5 MG PO TABS
5.0000 mg | ORAL_TABLET | Freq: Every evening | ORAL | Status: DC | PRN
  Administered 2023-05-03: 5 mg via ORAL
  Filled 2023-05-01: qty 1

## 2023-05-01 NOTE — Sepsis Progress Note (Signed)
Elink monitoring for the code sepsis protocol.  

## 2023-05-01 NOTE — ED Triage Notes (Signed)
Patient here POV from Home.  Endorses Right Buttock pain that radiates down Right Leg for 2.5 Days. Noted to be very fatigued as well. No Confirmed Fevers. Some Nausea. No Emesis. No Diarrhea. Cloudy Urine.   Noted to have an increase in Kidney Function and some Blood in UA with Clinic.   NAD Noted during Triage. A&Ox4. GCS 15. Ambulatory.

## 2023-05-01 NOTE — H&P (Addendum)
History and Physical    Tracy Anderson KGU:[id number] DOB: [birth year] DOA: 05/01/2023  DOS: the patient was seen and examined on 05/01/2023  PCP: Nelwyn Salisbury, MD   Patient coming from: Home   Chief Complaint:  Chief Complaint  Patient presents with   Flank Pain    HPI: 62 year old female medical history significant for hyperlipidemia, non-insulin-dependent diabetes mellitus and generalized anxiety disorder initially presented to [facility] emergency department with complaining of abnormal lab and left-sided back and abdominal pain.  Patient is stated that she has seen her primary care doctor yesterday and with concern for sciatica prednisone is being started.  However at home patient continues to develop low-grade fever, chill, nausea and continues to have left-sided back and abdominal pain refers to her left-sided groin.  Patient denies any dysuria, increased urinary urgency and hematuria but does report developing cloudy urine. During my evaluation here in the hospital, patient denies any ongoing pain, fever or chills, nausea and vomiting.  She denies any urinary urgency, hesitancy and increased urinary frequency.  Denies any abdominal pain at this time.  Denies any chest pain, shortness of breath and palpitation.  ED Course:  Initial presentation to ED patient found to have high temperature of 103, tachycardic 118, blood pressure within normal range and O2 sat 97% room air. CMP showed corrected sodium with high blood glucose 130, low bicarb 21, elevated blood close 216, elevated BUN 28, elevated creatinine 2.06. CBC showed evidence of leukocytosis 28.5 otherwise unremarkable. UA has evidence, moderate hemoglobin, protein 100, moderate leukocyte esterase and many bacteria evidence of UTI.  Pending urine culture. Initial presentation lactic acid 2.1.  With concern for renal stone CT stone chaser was ordered which showed nonspecific bilateral perinephric stranding concerning  for pyelonephritis, no urinary tract calculi or evidence of hydronephrosis.  Evidence of aortic atherosclerosis. Emergency department patient received ceftriaxone.  With sepsis protocoL patient being started on high flow IV fluid LR 150 cc/h.   Review of Systems:  Review of Systems  Constitutional:  Negative for chills and fever.  Respiratory:  Negative for cough.   Cardiovascular:  Negative for chest pain, palpitations and orthopnea.  Gastrointestinal:  Negative for nausea and vomiting.  Genitourinary:  Negative for dysuria, flank pain, frequency and urgency.  Musculoskeletal:  Negative for back pain.  Neurological:  Negative for dizziness and headaches.    Past Medical History:  Diagnosis Date   Arthritis    Asthma    as a child   Atypical mole 01/25/2018   mild atypia on left inner thigh   Back pain    Basal cell carcinoma 12/20/2018   nodular on right nose - MOHs   Depression    Diabetes mellitus type II    Herpes genitalia    Hyperlipidemia    Shellfish allergy    Shingles 2012   right flank    Past Surgical History:  Procedure Laterality Date   CHOLECYSTECTOMY N/A 07/05/2016   Procedure: LAPAROSCOPIC CHOLECYSTECTOMY;  Surgeon: Violeta Gelinas, MD;  Location: [facility] OR;  Service: General;  Laterality: N/A;   colonscopy  02/26/2020   per Dr. Lavon Paganini, adenomatous polyp, repeat in 7 yrs    SHOULDER ARTHROSCOPY WITH DISTAL CLAVICLE RESECTION Right 01/04/2023   Procedure: SHOULDER ARTHROSCOPY WITH DISTAL CLAVICLE EXCISION;  Surgeon: Bjorn Pippin, MD;  Location: [facility];  Service: Orthopedics;  Laterality: Right;   SHOULDER ARTHROSCOPY WITH SUBACROMIAL DECOMPRESSION, ROTATOR CUFF REPAIR AND BICEP TENDON REPAIR Right 01/04/2023   Procedure: SHOULDER ARTHROSCOPY  WITH SUBACROMIAL DECOMPRESSION, ROTATOR CUFF REPAIR AND BICEP TENDON REPAIR;  Surgeon: Bjorn Pippin, MD;  Location: [facility];  Service: Orthopedics;  Laterality: Right;   TYMPANOSTOMY  TUBE PLACEMENT       reports that she quit smoking about 32 years ago. Her smoking use included cigarettes. She has never used smokeless tobacco. She reports current alcohol use of about 2.0 standard drinks of alcohol per week. She reports that she does not use drugs.  Allergies  Allergen Reactions   Lisinopril Cough   Shrimp [Shellfish Allergy] Diarrhea    Severe stomach cramps and diarrhea   Clarithromycin Other (See Comments)    UNSPECIFIED     Family History  Problem Relation Age of Onset   Arthritis Other    Cancer Other        cervical   Depression Other    Diabetes Other    Ulcers Other    Breast cancer Maternal Grandmother    Diabetes Mother    Heart disease Mother    Cancer Mother    Depression Mother    Alcoholism Mother    Eating disorder Mother    Diabetes Father    Hypertension Father    Depression Father    Alcoholism Father    Colon polyps Neg Hx    Esophageal cancer Neg Hx    Stomach cancer Neg Hx    Rectal cancer Neg Hx     Prior to Admission medications   Medication Sig Start Date End Date Taking? Authorizing Provider  atorvastatin (LIPITOR) 10 MG tablet Take 1 tablet (10 mg total) by mouth daily. 02/16/23   Nelwyn Salisbury, MD  Blood Glucose Monitoring Suppl (FREESTYLE LITE) w/Device KIT Use to check blood sugar 3 times a day 08/12/21   Nelwyn Salisbury, MD  buPROPion (WELLBUTRIN XL) 150 MG 24 hr tablet Take 1 tablet (150 mg total) by mouth daily. 01/12/23   Nelwyn Salisbury, MD  COVID-19 mRNA bivalent vaccine, Pfizer, (PFIZER COVID-19 [facility] BIVALENT) injection Inject into the muscle. 08/16/21   Judyann Munson, MD  COVID-19 mRNA vaccine [phone] (COMIRNATY) syringe Inject into the muscle. 08/25/22     cyclobenzaprine (FLEXERIL) 5 MG tablet Take 1 tablet (5 mg total) by mouth at bedtime as needed. 08/25/22     estrogen, conjugated,-medroxyprogesterone (PREMPRO) 0.45-1.5 MG tablet Take 1 tablet by mouth daily. 08/04/22   Nelwyn Salisbury, MD  glucose blood  (FREESTYLE LITE) test strip Use to check blood sugar 3 times a day 08/12/21   Nelwyn Salisbury, MD  Lancets (FREESTYLE) lancets Use to check blood sugar 3 times a day 08/12/21   Nelwyn Salisbury, MD  tirzepatide [facility]) 12.5 MG/0.5ML Pen Inject 12.5 mg into the skin once a week. 02/16/23   Nelwyn Salisbury, MD  Vitamin D, Ergocalciferol, (DRISDOL) 1.25 MG (50000 UNIT) CAPS capsule Take 1 capsule (50,000 Units total) by mouth every 7 (seven) days. 06/20/22   Nelwyn Salisbury, MD     Physical Exam: Vitals:   05/01/23 2045 05/01/23 2125 05/01/23 2204 05/01/23 2338  BP: (!) 147/76  (!) 152/81 119/63  Pulse: (!) 101  (!) 119 (!) 118  Resp: 19  18 18   Temp:  99.4 F (37.4 C) (!) 101.3 F (38.5 C) (!) 103.2 F (39.6 C)  TempSrc:  Oral Oral Oral  SpO2: 92%  100% 96%  Weight:      Height:        Physical Exam Constitutional:  Appearance: Normal appearance.  HENT:     Mouth/Throat:     Mouth: Mucous membranes are moist.  Eyes:     Pupils: Pupils are equal, round, and reactive to light.  Cardiovascular:     Rate and Rhythm: Normal rate and regular rhythm.     Pulses: Normal pulses.     Heart sounds: Normal heart sounds.  Pulmonary:     Effort: Pulmonary effort is normal.     Breath sounds: Normal breath sounds.  Abdominal:     General: Bowel sounds are normal. There is no distension.     Tenderness: There is no abdominal tenderness. There is no right CVA tenderness, left CVA tenderness or guarding.  Musculoskeletal:        General: No swelling.     Cervical back: Neck supple.     Right lower leg: No edema.     Left lower leg: No edema.  Skin:    General: Skin is warm.     Capillary Refill: Capillary refill takes less than 2 seconds.  Neurological:     Mental Status: She is alert and oriented to person, place, and time.  Psychiatric:        Mood and Affect: Mood normal.        Thought Content: Thought content normal.      Labs on Admission: I have personally reviewed following  labs and imaging studies  CBC: Recent Labs  Lab 05/01/23 1819  WBC 28.5*  HGB 12.4  HCT 36.2  MCV 84.4  PLT 179   Basic Metabolic Panel: Recent Labs  Lab 05/01/23 1819  NA 128*  K 3.7  CL 94*  CO2 21*  GLUCOSE 216*  BUN 28*  CREATININE 2.06*  CALCIUM 9.1   GFR: Estimated Creatinine Clearance: 29.1 mL/min (A) (by C-G formula based on SCr of 2.06 mg/dL (H)). Liver Function Tests: Recent Labs  Lab 05/01/23 1819  AST 18  ALT 29  ALKPHOS 66  BILITOT 1.1  PROT 7.0  ALBUMIN 4.0   No results for input(s): "LIPASE", "AMYLASE" in the last 168 hours. No results for input(s): "AMMONIA" in the last 168 hours. Coagulation Profile: Recent Labs  Lab 05/01/23 2246  INR 1.4*   Cardiac Enzymes: No results for input(s): "CKTOTAL", "CKMB", "CKMBINDEX", "TROPONINI", "TROPONINIHS" in the last 168 hours. BNP (last 3 results) No results for input(s): "BNP" in the last 8760 hours. HbA1C: No results for input(s): "HGBA1C" in the last 72 hours. CBG: No results for input(s): "GLUCAP" in the last 168 hours. Lipid Profile: No results for input(s): "CHOL", "HDL", "LDLCALC", "TRIG", "CHOLHDL", "LDLDIRECT" in the last 72 hours. Thyroid Function Tests: No results for input(s): "TSH", "T4TOTAL", "FREET4", "T3FREE", "THYROIDAB" in the last 72 hours. Anemia Panel: No results for input(s): "VITAMINB12", "FOLATE", "FERRITIN", "TIBC", "IRON", "RETICCTPCT" in the last 72 hours. Urine analysis:    Component Value Date/Time   COLORURINE YELLOW 05/01/2023 1819   APPEARANCEUR HAZY (A) 05/01/2023 1819   LABSPEC 1.016 05/01/2023 1819   PHURINE 5.5 05/01/2023 1819   GLUCOSEU NEGATIVE 05/01/2023 1819   GLUCOSEU >=1000 (A) 03/21/2016 0732   HGBUR MODERATE (A) 05/01/2023 1819   HGBUR negative 02/16/2010 1604   BILIRUBINUR NEGATIVE 05/01/2023 1819   BILIRUBINUR neg 05/02/2019 0915   KETONESUR NEGATIVE 05/01/2023 1819   PROTEINUR 100 (A) 05/01/2023 1819   UROBILINOGEN 0.2 05/02/2019 0915    UROBILINOGEN 0.2 03/21/2016 0732   NITRITE NEGATIVE 05/01/2023 1819   LEUKOCYTESUR MODERATE (A) 05/01/2023 1819    Radiological Exams  on Admission: I have personally reviewed images CT Renal Stone Study  Result Date: 05/01/2023 CLINICAL DATA:  Abdominal/flank pain, stone suspected. Right buttock pain radiating down the right leg. Fatigue and nausea. EXAM: CT ABDOMEN AND PELVIS WITHOUT CONTRAST TECHNIQUE: Multidetector CT imaging of the abdomen and pelvis was performed following the standard protocol without IV contrast. RADIATION DOSE REDUCTION: This exam was performed according to the departmental dose-optimization program which includes automated exposure control, adjustment of the mA and/or kV according to patient size and/or use of iterative reconstruction technique. COMPARISON:  CT abdomen and pelvis 05/10/2010 FINDINGS: Lower chest: No acute abnormality. Hepatobiliary: No focal liver abnormality is seen. Status post cholecystectomy. No biliary dilatation. Pancreas: Unremarkable. Spleen: Unremarkable. Adrenals/Urinary Tract: Unremarkable adrenal glands. No renal calculi or hydronephrosis. New moderate bilateral perinephric stranding. Unremarkable bladder. Stomach/Bowel: There is a small sliding hiatal hernia. There is no evidence of bowel obstruction or inflammation. The appendix is unremarkable. Vascular/Lymphatic: Mild abdominal aortic atherosclerosis without aneurysm. No enlarged lymph nodes. Reproductive: Uterus and bilateral adnexa are unremarkable. Other: No ascites or pneumoperitoneum. Musculoskeletal: No suspicious osseous lesion. Advanced lower lumbar facet arthrosis with grade 1 anterolisthesis at L4-5. IMPRESSION: 1. Nonspecific bilateral perinephric stranding. Correlate for signs of urinary tract infection. 2. No urinary tract calculi or hydronephrosis. 3.  Aortic Atherosclerosis (ICD10-I70.0). Electronically Signed   By: Sebastian Ache M.D.   On: 05/01/2023 20:03    EKG: My personal  interpretation of EKG shows: Sinus tachycardia and right bundle branch block.  No evidence of ST elevation or depression anterior reinfarction.     Assessment/Plan: Principal Problem:   Pyelonephritis Active Problems:   Sepsis (HCC)   HLD (hyperlipidemia)   Hyponatremia   AKI (acute kidney injury) (HCC)   GAD (generalized anxiety disorder)    Assessment and Plan: No notes have been filed under this hospital service. Service: Hospitalist  Pyelonephritis Sepsis secondary from pyelonephritis - Initial presentation patient found febrile, tachycardic, elevated lactic acid and lab work showed leukocytosis 28.  Evidence suggestive sepsis in the setting of pyelonephritis. -CBC showed evidence of leukocytosis 28.5 otherwise unremarkable. UA has evidence, moderate hemoglobin, protein 100, moderate leukocyte esterase and many bacteria evidence of UTI.  Pending urine culture. Initial presentation lactic acid 2.1. -  CT stone chaser was ordered which showed nonspecific bilateral perinephric stranding concerning for pyelonephritis, no urinary tract calculi or evidence of hydronephrosis.   Emergency department patient received ceftriaxone.  With sepsis protocoL patient being started on high flow IV fluid LR 150 cc/h. -Continue sepsis protocol - Continue broad-spectrum antibiotics cefepime per pharmacy protocol -Continue to normal saline 150 cc/h. - Follow-up with blood and urine culture result for appropriate antibiotic guidance. -Checking CBC, lactic acid in the morning  Hyponatremia -CMP showed corrected sodium is 130 with high blood glucose 230. -Acute onset of hyponatremia in the setting of AKI. -Continue normal saline 150 cc/h. -Checking BMP in the morning. -If morning a.m. sodium has been corrected more than 4 to 6 meq will change IV fluid to LR. Given that development of acute hyponatremia the setting of acute kidney injury not checking urinary osmolarity, urine sodium and serum  Ospolot at this time.   Prerenal AKI -CMP showed elevated creatinine 2.06. -Normal renal function at baseline - Acute kidney injury in the setting of sepsis tachycardia decreasing effective blood flow to kidney.  Not concerning for intrinsic renal and postrenal obstruction. -CT abdomen pelvis showed evidence of pyelonephritis.  No concern for hydronephrosis and urinary tract calculi. - Continue IV fluid  normal saline 150 cc/hr for now.  Non anion gap metabolic acidosis -Slightly low bicarb 21.  Normal anion gap. -Anion gap 12 acidosis in setting of AKI as well. - Continue to monitor.  No indication for bicarb drip.   Hyperlipidemia - Will resume Lipitor tomorrow  General anxiety disorder -Resumed home bupropion 150 mg daily   Diabetes mellitus type 2-non-insulin-dependent -Most recent A1c is 6. - Patient reported taking Mounjaro 12.5 mg once a week. - Continue diabetic diet, sliding scale SSI and check POC 3 times daily with meal and bedtime.   DVT prophylaxis: SQ Heparin Code Status: Full Code.  Discussed with patient Diet: Family Communication:  none Disposition Plan: Follow-up with blood culture and urinary culture result for antibiotic guidance discharge to home in 1 to 2 days. Admission status: Inpatient, Telemetry bed   Tereasa Coop, MD [facility] 05/02/2023, 12:09 AM    If 7PM-7AM, please contact night-coverage [email or web address] Password [email or web address]

## 2023-05-01 NOTE — H&P (Incomplete)
History and Physical    Tracy Anderson ZOX:[id number] DOB: 03-11-1961 DOA: 05/01/2023  DOS: the patient was seen and examined on 05/01/2023  PCP: Nelwyn Salisbury, MD   Patient coming from: Home   Chief Complaint:  Chief Complaint  Patient presents with  . Flank Pain    HPI: 62 year old female medical history significant for hyperlipidemia, non-insulin-dependent diabetes mellitus and generalized anxiety disorder initially presented to [facility] emergency department with complaining of abnormal lab and left-sided back and abdominal pain.  Patient is stated that she has seen her primary care doctor yesterday and with concern for sciatica prednisone is being started.  However at home patient continues to develop low-grade fever, chill, nausea and continues to have left-sided back and abdominal pain refers to her left-sided groin.  Patient denies any dysuria, increased urinary urgency and hematuria but does report developing cloudy urine. During my evaluation here in the hospital, patient denies any ongoing pain, fever or chills, nausea and vomiting.  She denies any urinary urgency, hesitancy and increased urinary frequency.  Denies any abdominal pain at this time.  Denies any chest pain, shortness of breath and palpitation.  ED Course:  Initial presentation to ED patient found to have high temperature of 103, tachycardic 118, blood pressure within normal range and O2 sat 97% room air. CMP showed corrected sodium with high blood glucose 130, low bicarb 21, elevated blood close 216, elevated BUN 28, elevated creatinine 2.06. CBC showed evidence of leukocytosis 28.5 otherwise unremarkable. UA has evidence, moderate hemoglobin, protein 100, moderate leukocyte esterase and many bacteria evidence of UTI.  Pending urine culture. Initial presentation lactic acid 2.1.  With concern for renal stone CT stone chaser was ordered which showed nonspecific bilateral perinephric stranding concerning  for pyelonephritis, no urinary tract calculi or evidence of hydronephrosis.  Evidence of aortic atherosclerosis. Emergency department patient received ceftriaxone.  With sepsis protocoL patient being started on high flow IV fluid LR 150 cc/h.   Review of Systems:  Review of Systems  Constitutional:  Negative for chills and fever.  Respiratory:  Negative for cough.   Cardiovascular:  Negative for chest pain, palpitations and orthopnea.  Gastrointestinal:  Negative for nausea and vomiting.  Genitourinary:  Negative for dysuria, flank pain, frequency and urgency.  Musculoskeletal:  Negative for back pain.  Neurological:  Negative for dizziness and headaches.    Past Medical History:  Diagnosis Date  . Arthritis   . Asthma    as a child  . Atypical mole 01/25/2018   mild atypia on left inner thigh  . Back pain   . Basal cell carcinoma 12/20/2018   nodular on right nose - MOHs  . Depression   . Diabetes mellitus type II   . Herpes genitalia   . Hyperlipidemia   . Shellfish allergy   . Shingles 2012   right flank    Past Surgical History:  Procedure Laterality Date  . CHOLECYSTECTOMY N/A 07/05/2016   Procedure: LAPAROSCOPIC CHOLECYSTECTOMY;  Surgeon: Violeta Gelinas, MD;  Location: [facility] OR;  Service: General;  Laterality: N/A;  . colonscopy  02/26/2020   per Dr. Lavon Paganini, adenomatous polyp, repeat in 7 yrs   . SHOULDER ARTHROSCOPY WITH DISTAL CLAVICLE RESECTION Right 01/04/2023   Procedure: SHOULDER ARTHROSCOPY WITH DISTAL CLAVICLE EXCISION;  Surgeon: Bjorn Pippin, MD;  Location: [facility];  Service: Orthopedics;  Laterality: Right;  . SHOULDER ARTHROSCOPY WITH SUBACROMIAL DECOMPRESSION, ROTATOR CUFF REPAIR AND BICEP TENDON REPAIR Right 01/04/2023   Procedure: SHOULDER ARTHROSCOPY  WITH SUBACROMIAL DECOMPRESSION, ROTATOR CUFF REPAIR AND BICEP TENDON REPAIR;  Surgeon: Bjorn Pippin, MD;  Location: [facility];  Service: Orthopedics;  Laterality: Right;  .  TYMPANOSTOMY TUBE PLACEMENT       reports that she quit smoking about 32 years ago. Her smoking use included cigarettes. She has never used smokeless tobacco. She reports current alcohol use of about 2.0 standard drinks of alcohol per week. She reports that she does not use drugs.  Allergies  Allergen Reactions  . Lisinopril Cough  . Shrimp [Shellfish Allergy] Diarrhea    Severe stomach cramps and diarrhea  . Clarithromycin Other (See Comments)    UNSPECIFIED     Family History  Problem Relation Age of Onset  . Arthritis Other   . Cancer Other        cervical  . Depression Other   . Diabetes Other   . Ulcers Other   . Breast cancer Maternal Grandmother   . Diabetes Mother   . Heart disease Mother   . Cancer Mother   . Depression Mother   . Alcoholism Mother   . Eating disorder Mother   . Diabetes Father   . Hypertension Father   . Depression Father   . Alcoholism Father   . Colon polyps Neg Hx   . Esophageal cancer Neg Hx   . Stomach cancer Neg Hx   . Rectal cancer Neg Hx     Prior to Admission medications   Medication Sig Start Date End Date Taking? Authorizing Provider  atorvastatin (LIPITOR) 10 MG tablet Take 1 tablet (10 mg total) by mouth daily. 02/16/23   Nelwyn Salisbury, MD  Blood Glucose Monitoring Suppl (FREESTYLE LITE) w/Device KIT Use to check blood sugar 3 times a day 08/12/21   Nelwyn Salisbury, MD  buPROPion (WELLBUTRIN XL) 150 MG 24 hr tablet Take 1 tablet (150 mg total) by mouth daily. 01/12/23   Nelwyn Salisbury, MD  COVID-19 mRNA bivalent vaccine, Pfizer, (PFIZER COVID-19 [facility] BIVALENT) injection Inject into the muscle. 08/16/21   Judyann Munson, MD  COVID-19 mRNA vaccine [phone] (COMIRNATY) syringe Inject into the muscle. 08/25/22     cyclobenzaprine (FLEXERIL) 5 MG tablet Take 1 tablet (5 mg total) by mouth at bedtime as needed. 08/25/22     estrogen, conjugated,-medroxyprogesterone (PREMPRO) 0.45-1.5 MG tablet Take 1 tablet by mouth daily. 08/04/22   Nelwyn Salisbury, MD  glucose blood (FREESTYLE LITE) test strip Use to check blood sugar 3 times a day 08/12/21   Nelwyn Salisbury, MD  Lancets (FREESTYLE) lancets Use to check blood sugar 3 times a day 08/12/21   Nelwyn Salisbury, MD  tirzepatide [facility]) 12.5 MG/0.5ML Pen Inject 12.5 mg into the skin once a week. 02/16/23   Nelwyn Salisbury, MD  Vitamin D, Ergocalciferol, (DRISDOL) 1.25 MG (50000 UNIT) CAPS capsule Take 1 capsule (50,000 Units total) by mouth every 7 (seven) days. 06/20/22   Nelwyn Salisbury, MD     Physical Exam: Vitals:   05/01/23 2045 05/01/23 2125 05/01/23 2204 05/01/23 2338  BP: (!) 147/76  (!) 152/81 119/63  Pulse: (!) 101  (!) 119 (!) 118  Resp: 19     Temp:  99.4 F (37.4 C) (!) 101.3 F (38.5 C) (!) 103.2 F (39.6 C)  TempSrc:  Oral Oral Oral  SpO2: 92%  100% 96%  Weight:      Height:        Physical Exam Constitutional:  Appearance: Normal appearance.  HENT:     Mouth/Throat:     Mouth: Mucous membranes are moist.  Eyes:     Pupils: Pupils are equal, round, and reactive to light.  Cardiovascular:     Rate and Rhythm: Normal rate and regular rhythm.     Pulses: Normal pulses.     Heart sounds: Normal heart sounds.  Pulmonary:     Effort: Pulmonary effort is normal.     Breath sounds: Normal breath sounds.  Abdominal:     General: Bowel sounds are normal. There is no distension.     Tenderness: There is no abdominal tenderness. There is no right CVA tenderness, left CVA tenderness or guarding.  Musculoskeletal:        General: No swelling.     Cervical back: Neck supple.     Right lower leg: No edema.     Left lower leg: No edema.  Skin:    General: Skin is warm.     Capillary Refill: Capillary refill takes less than 2 seconds.  Neurological:     Mental Status: She is alert and oriented to person, place, and time.  Psychiatric:        Mood and Affect: Mood normal.        Thought Content: Thought content normal.      Labs on Admission: I have  personally reviewed following labs and imaging studies  CBC: Recent Labs  Lab 05/01/23 1819  WBC 28.5*  HGB 12.4  HCT 36.2  MCV 84.4  PLT 179   Basic Metabolic Panel: Recent Labs  Lab 05/01/23 1819  NA 128*  K 3.7  CL 94*  CO2 21*  GLUCOSE 216*  BUN 28*  CREATININE 2.06*  CALCIUM 9.1   GFR: Estimated Creatinine Clearance: 29.1 mL/min (A) (by C-G formula based on SCr of 2.06 mg/dL (H)). Liver Function Tests: Recent Labs  Lab 05/01/23 1819  AST 18  ALT 29  ALKPHOS 66  BILITOT 1.1  PROT 7.0  ALBUMIN 4.0   No results for input(s): "LIPASE", "AMYLASE" in the last 168 hours. No results for input(s): "AMMONIA" in the last 168 hours. Coagulation Profile: Recent Labs  Lab 05/01/23 2246  INR 1.4*   Cardiac Enzymes: No results for input(s): "CKTOTAL", "CKMB", "CKMBINDEX", "TROPONINI", "TROPONINIHS" in the last 168 hours. BNP (last 3 results) No results for input(s): "BNP" in the last 8760 hours. HbA1C: No results for input(s): "HGBA1C" in the last 72 hours. CBG: No results for input(s): "GLUCAP" in the last 168 hours. Lipid Profile: No results for input(s): "CHOL", "HDL", "LDLCALC", "TRIG", "CHOLHDL", "LDLDIRECT" in the last 72 hours. Thyroid Function Tests: No results for input(s): "TSH", "T4TOTAL", "FREET4", "T3FREE", "THYROIDAB" in the last 72 hours. Anemia Panel: No results for input(s): "VITAMINB12", "FOLATE", "FERRITIN", "TIBC", "IRON", "RETICCTPCT" in the last 72 hours. Urine analysis:    Component Value Date/Time   COLORURINE YELLOW 05/01/2023 1819   APPEARANCEUR HAZY (A) 05/01/2023 1819   LABSPEC 1.016 05/01/2023 1819   PHURINE 5.5 05/01/2023 1819   GLUCOSEU NEGATIVE 05/01/2023 1819   GLUCOSEU >=1000 (A) 03/21/2016 0732   HGBUR MODERATE (A) 05/01/2023 1819   HGBUR negative 02/16/2010 1604   BILIRUBINUR NEGATIVE 05/01/2023 1819   BILIRUBINUR neg 05/02/2019 0915   KETONESUR NEGATIVE 05/01/2023 1819   PROTEINUR 100 (A) 05/01/2023 1819    UROBILINOGEN 0.2 05/02/2019 0915   UROBILINOGEN 0.2 03/21/2016 0732   NITRITE NEGATIVE 05/01/2023 1819   LEUKOCYTESUR MODERATE (A) 05/01/2023 1819    Radiological Exams  on Admission: I have personally reviewed images CT Renal Stone Study  Result Date: 05/01/2023 CLINICAL DATA:  Abdominal/flank pain, stone suspected. Right buttock pain radiating down the right leg. Fatigue and nausea. EXAM: CT ABDOMEN AND PELVIS WITHOUT CONTRAST TECHNIQUE: Multidetector CT imaging of the abdomen and pelvis was performed following the standard protocol without IV contrast. RADIATION DOSE REDUCTION: This exam was performed according to the departmental dose-optimization program which includes automated exposure control, adjustment of the mA and/or kV according to patient size and/or use of iterative reconstruction technique. COMPARISON:  CT abdomen and pelvis 05/10/2010 FINDINGS: Lower chest: No acute abnormality. Hepatobiliary: No focal liver abnormality is seen. Status post cholecystectomy. No biliary dilatation. Pancreas: Unremarkable. Spleen: Unremarkable. Adrenals/Urinary Tract: Unremarkable adrenal glands. No renal calculi or hydronephrosis. New moderate bilateral perinephric stranding. Unremarkable bladder. Stomach/Bowel: There is a small sliding hiatal hernia. There is no evidence of bowel obstruction or inflammation. The appendix is unremarkable. Vascular/Lymphatic: Mild abdominal aortic atherosclerosis without aneurysm. No enlarged lymph nodes. Reproductive: Uterus and bilateral adnexa are unremarkable. Other: No ascites or pneumoperitoneum. Musculoskeletal: No suspicious osseous lesion. Advanced lower lumbar facet arthrosis with grade 1 anterolisthesis at L4-5. IMPRESSION: 1. Nonspecific bilateral perinephric stranding. Correlate for signs of urinary tract infection. 2. No urinary tract calculi or hydronephrosis. 3.  Aortic Atherosclerosis (ICD10-I70.0). Electronically Signed   By: Sebastian Ache M.D.   On: 05/01/2023  20:03    EKG: My personal interpretation of EKG shows: Sinus tachycardia and right bundle branch block.  No evidence of ST elevation or depression anterior reinfarction.     Assessment/Plan: Principal Problem:   Pyelonephritis Active Problems:   Sepsis (HCC)   HLD (hyperlipidemia)   Hyponatremia   AKI (acute kidney injury) (HCC)   GAD (generalized anxiety disorder)    Assessment and Plan: No notes have been filed under this hospital service. Service: Hospitalist  Pyelonephritis Sepsis secondary from pyelonephritis - Initial presentation patient found febrile, tachycardic, elevated lactic acid and lab work showed leukocytosis 28.  Evidence suggestive sepsis in the setting of pyelonephritis. -CBC showed evidence of leukocytosis 28.5 otherwise unremarkable. UA has evidence, moderate hemoglobin, protein 100, moderate leukocyte esterase and many bacteria evidence of UTI.  Pending urine culture. Initial presentation lactic acid 2.1. -  CT stone chaser was ordered which showed nonspecific bilateral perinephric stranding concerning for pyelonephritis, no urinary tract calculi or evidence of hydronephrosis.   Emergency department patient received ceftriaxone.  With sepsis protocoL patient being started on high flow IV fluid LR 150 cc/h. -Continue sepsis protocol - Continue broad-spectrum antibiotics cefepime per pharmacy protocol -Continue to normal saline 150 cc/h. - Follow-up with blood and urine culture result for appropriate antibiotic guidance. -Checking CBC, lactic acid in the morning  Hyponatremia -CMP showed corrected sodium is 130 with high blood glucose 230. -Acute onset of hyponatremia in the setting of AKI. -Continue normal saline 150 cc/h. -Checking BMP in the morning. -If morning a.m. sodium has been corrected more than 4 to 6 meq will change IV fluid to LR.   Prerenal AKI -CMP showed corrected sodium with high blood glucose 130, low bicarb 21, elevated blood close  216, elevated BUN 28, elevated creatinine 2.06.  Hyperlipidemia  General anxiety disorder     DVT prophylaxis: {Blank single:19197::"Lovenox","SQ Heparin","IV heparin gtts","Xarelto","Eliquis","Coumadin","SCDs","***"} Code Status: {Blank single:19197::"Full Code","DNR with Intubation","DNR/DNI(Do NOT Intubate)","Comfort Care","***"} Diet: Family Communication:  ***  Disposition Plan:  ***  Consults:  ***  Admission status: {Blank single:19197::"Observation","Inpatient"}, {Blank single:19197::"Med-Surg","Telemetry bed","Step Down Unit"}   Tereasa Coop, MD [facility] 05/01/2023, 11:58 PM    If 7PM-7AM, please contact night-coverage [email or web address] Password [email or web address]

## 2023-05-01 NOTE — Progress Notes (Signed)
Pharmacy Antibiotic Note  Tracy Anderson is a 62 y.o. female admitted on 05/01/2023 with  pyelonephritis .  Pharmacy has been consulted for Cefepime dosing.  Plan: Cefepime 2gm IV q24h Will f/u renal function, micro data, and pt's clinical condition  Height: 5\' 7"  (170.2 cm) Weight: 74.8 kg (165 lb) IBW/kg (Calculated) : 61.6  Temp (24hrs), Avg:99.6 F (37.6 C), Min:98.2 F (36.8 C), Max:101.3 F (38.5 C)  Recent Labs  Lab 05/01/23 1819 05/01/23 2102  WBC 28.5*  --   CREATININE 2.06*  --   LATICACIDVEN  --  2.1*    Estimated Creatinine Clearance: 29.1 mL/min (A) (by C-G formula based on SCr of 2.06 mg/dL (H)).    Allergies  Allergen Reactions   Lisinopril Cough   Shrimp [Shellfish Allergy] Diarrhea    Severe stomach cramps and diarrhea   Clarithromycin Other (See Comments)    UNSPECIFIED     Antimicrobials this admission: 6/25 Cefepime >>   Microbiology results: 6/25 BCx:  6/25 UCx:    Thank you for allowing pharmacy to be a part of this patient's care.  Christoper Fabian, PharmD, BCPS Please see amion for complete clinical pharmacist phone list 05/01/2023 10:22 PM

## 2023-05-01 NOTE — Progress Notes (Signed)
   Patient Name: Tracy Anderson, Tracy Anderson DOB: 1961/02/25 MRN: [id number] Transferring facility: [facility] Requesting provider: Karie Mainland, [place] Reason for transfer: aki, pyelonephritis, hyponatremia 62 yo with WF with flank pain x 2 days. given prednisone by PCP. had chills at home. increased lethargy at home.  today, WBC 28. SCr 2.0, sodium 128. CT abd bilateral pyelonephritis. no dysuria, no urinary frequency. EDP to order blood and urine. started on IV rocephin and IVF Going to: [facility] Admission Status: obs Bed Type: med/surg To Do:  [facility] will assume care on arrival to accepting facility. Until arrival, medical decision making responsibilities remain with the EDP.  However, [facility] available 24/7 for questions and assistance.   Nursing staff please page [facility] Admits and Consults [phone]) as soon as the patient arrives to the hospital.  Carollee Herter, DO [facility]

## 2023-05-01 NOTE — ED Provider Notes (Signed)
[place] EMERGENCY DEPARTMENT AT [facility] Provider Note   CSN: [id number] Arrival date & time: 05/01/23  1807     History  Chief Complaint  Patient presents with   Flank Pain    Tracy Anderson is a 62 y.o. female.  62 year old female presents for evaluation of an abnormal lab.  She states she saw her PCP yesterday and was started on prednisone for concern of sciatic nerve syndrome.  She states she returned today because she was having somnolence, chills.  She had some blood work done and received a call about an hour ago stating she needed to come into the emergency room due to kidney function abnormality.  Denies dysuria, hematuria.  Does report cloudy urine.  The history is provided by the patient. No language interpreter was used.       Home Medications Prior to Admission medications   Medication Sig Start Date End Date Taking? Authorizing Provider  atorvastatin (LIPITOR) 10 MG tablet Take 1 tablet (10 mg total) by mouth daily. 02/16/23   Nelwyn Salisbury, MD  Blood Glucose Monitoring Suppl (FREESTYLE LITE) w/Device KIT Use to check blood sugar 3 times a day 08/12/21   Nelwyn Salisbury, MD  buPROPion (WELLBUTRIN XL) 150 MG 24 hr tablet Take 1 tablet (150 mg total) by mouth daily. 01/12/23   Nelwyn Salisbury, MD  COVID-19 mRNA bivalent vaccine, Pfizer, (PFIZER COVID-19 [facility] BIVALENT) injection Inject into the muscle. 08/16/21   Judyann Munson, MD  COVID-19 mRNA vaccine [phone] (COMIRNATY) syringe Inject into the muscle. 08/25/22     cyclobenzaprine (FLEXERIL) 5 MG tablet Take 1 tablet (5 mg total) by mouth at bedtime as needed. 08/25/22     estrogen, conjugated,-medroxyprogesterone (PREMPRO) 0.45-1.5 MG tablet Take 1 tablet by mouth daily. 08/04/22   Nelwyn Salisbury, MD  glucose blood (FREESTYLE LITE) test strip Use to check blood sugar 3 times a day 08/12/21   Nelwyn Salisbury, MD  Lancets (FREESTYLE) lancets Use to check blood sugar 3 times a day 08/12/21   Nelwyn Salisbury, MD  tirzepatide [facility]) 12.5 MG/0.5ML Pen Inject 12.5 mg into the skin once a week. 02/16/23   Nelwyn Salisbury, MD  Vitamin D, Ergocalciferol, (DRISDOL) 1.25 MG (50000 UNIT) CAPS capsule Take 1 capsule (50,000 Units total) by mouth every 7 (seven) days. 06/20/22   Nelwyn Salisbury, MD      Allergies    Lisinopril, Shrimp [shellfish allergy], and Clarithromycin    Review of Systems   Review of Systems  Constitutional:  Positive for chills. Negative for fever.  Respiratory:  Negative for shortness of breath.   Cardiovascular:  Negative for chest pain.  Gastrointestinal:  Negative for abdominal pain, nausea and vomiting.  Genitourinary:  Positive for flank pain. Negative for difficulty urinating and dysuria.  All other systems reviewed and are negative.   Physical Exam Updated Vital Signs BP 131/69 (BP Location: Right Arm)   Pulse 98   Temp 98.2 F (36.8 C) (Oral)   Resp 18   Ht 5\' 7"  (1.702 m)   Wt 74.8 kg   LMP 08/08/2012   SpO2 97%   BMI 25.84 kg/m  Physical Exam Vitals and nursing note reviewed.  Constitutional:      General: She is not in acute distress.    Appearance: Normal appearance. She is not ill-appearing.  HENT:     Head: Normocephalic and atraumatic.     Nose: Nose normal.  Eyes:     General:  No scleral icterus.    Extraocular Movements: Extraocular movements intact.     Conjunctiva/sclera: Conjunctivae normal.  Cardiovascular:     Rate and Rhythm: Normal rate and regular rhythm.     Heart sounds: Normal heart sounds.  Pulmonary:     Effort: Pulmonary effort is normal. No respiratory distress.     Breath sounds: Normal breath sounds. No wheezing or rales.  Abdominal:     General: There is no distension.     Tenderness: There is no abdominal tenderness.  Musculoskeletal:        General: Normal range of motion.     Cervical back: Normal range of motion.  Skin:    General: Skin is warm and dry.  Neurological:     General: No focal deficit  present.     Mental Status: She is alert. Mental status is at baseline.     ED Results / Procedures / Treatments   Labs (all labs ordered are listed, but only abnormal results are displayed) Labs Reviewed  CBC - Abnormal; Notable for the following components:      Result Value   WBC 28.5 (*)    All other components within normal limits  COMPREHENSIVE METABOLIC PANEL  URINALYSIS, ROUTINE W REFLEX MICROSCOPIC    EKG None  Radiology No results found.  Procedures Procedures    Medications Ordered in ED Medications - No data to display  ED Course/ Medical Decision Making/ A&P                             Medical Decision Making Amount and/or Complexity of Data Reviewed Labs: ordered. Radiology: ordered.  Risk Decision regarding hospitalization.   62 year old female presents today for concern of flank pain.  Endorses chills, and fatigue.  Was noted to have abnormal labs with PCP and referred to the emergency department.  She is unsure of what the abnormality was but is sure that it related to her kidney function.  CBC shows leukocytosis of 28.5 which could be reactive to recent prednisone course.  CMP shows sodium of 128, glucose of 216, and creatinine of 2.06.  AKI is new.  UA does show evidence of UTI.  CT renal stone study shows bilateral perinephric stranding.  2 L fluid bolus given.  Rocephin 2 g ordered.  Urine culture and blood culture ordered.  Discussed with hospitalist who will accept patient for admission.  Plan discussed with patient who is in agreement.   Final Clinical Impression(s) / ED Diagnoses Final diagnoses:  Pyelonephritis    Rx / DC Orders ED Discharge Orders     None         Marita Kansas, PA-C 05/01/23 2053    Ernie Avena, MD 05/01/23 2102

## 2023-05-02 DIAGNOSIS — M5432 Sciatica, left side: Secondary | ICD-10-CM | POA: Diagnosis present

## 2023-05-02 DIAGNOSIS — M47816 Spondylosis without myelopathy or radiculopathy, lumbar region: Secondary | ICD-10-CM | POA: Diagnosis present

## 2023-05-02 DIAGNOSIS — A419 Sepsis, unspecified organism: Secondary | ICD-10-CM

## 2023-05-02 DIAGNOSIS — N12 Tubulo-interstitial nephritis, not specified as acute or chronic: Secondary | ICD-10-CM

## 2023-05-02 DIAGNOSIS — E871 Hypo-osmolality and hyponatremia: Secondary | ICD-10-CM

## 2023-05-02 DIAGNOSIS — A4151 Sepsis due to Escherichia coli [E. coli]: Secondary | ICD-10-CM | POA: Diagnosis present

## 2023-05-02 DIAGNOSIS — E872 Acidosis, unspecified: Secondary | ICD-10-CM | POA: Diagnosis present

## 2023-05-02 DIAGNOSIS — R7989 Other specified abnormal findings of blood chemistry: Secondary | ICD-10-CM | POA: Diagnosis not present

## 2023-05-02 DIAGNOSIS — E785 Hyperlipidemia, unspecified: Secondary | ICD-10-CM

## 2023-05-02 DIAGNOSIS — R6520 Severe sepsis without septic shock: Secondary | ICD-10-CM | POA: Diagnosis present

## 2023-05-02 DIAGNOSIS — E861 Hypovolemia: Secondary | ICD-10-CM | POA: Diagnosis present

## 2023-05-02 DIAGNOSIS — E876 Hypokalemia: Secondary | ICD-10-CM | POA: Diagnosis present

## 2023-05-02 DIAGNOSIS — F411 Generalized anxiety disorder: Secondary | ICD-10-CM

## 2023-05-02 DIAGNOSIS — N179 Acute kidney failure, unspecified: Secondary | ICD-10-CM | POA: Diagnosis not present

## 2023-05-02 DIAGNOSIS — D259 Leiomyoma of uterus, unspecified: Secondary | ICD-10-CM | POA: Diagnosis present

## 2023-05-02 DIAGNOSIS — Z79899 Other long term (current) drug therapy: Secondary | ICD-10-CM | POA: Diagnosis not present

## 2023-05-02 DIAGNOSIS — M48061 Spinal stenosis, lumbar region without neurogenic claudication: Secondary | ICD-10-CM | POA: Diagnosis present

## 2023-05-02 DIAGNOSIS — E8809 Other disorders of plasma-protein metabolism, not elsewhere classified: Secondary | ICD-10-CM | POA: Diagnosis present

## 2023-05-02 DIAGNOSIS — I700 Atherosclerosis of aorta: Secondary | ICD-10-CM | POA: Diagnosis present

## 2023-05-02 DIAGNOSIS — F32A Depression, unspecified: Secondary | ICD-10-CM | POA: Diagnosis present

## 2023-05-02 DIAGNOSIS — E1165 Type 2 diabetes mellitus with hyperglycemia: Secondary | ICD-10-CM | POA: Diagnosis present

## 2023-05-02 DIAGNOSIS — D6489 Other specified anemias: Secondary | ICD-10-CM | POA: Diagnosis not present

## 2023-05-02 DIAGNOSIS — D6959 Other secondary thrombocytopenia: Secondary | ICD-10-CM | POA: Diagnosis present

## 2023-05-02 DIAGNOSIS — R109 Unspecified abdominal pain: Secondary | ICD-10-CM | POA: Diagnosis present

## 2023-05-02 DIAGNOSIS — M6088 Other myositis, other site: Secondary | ICD-10-CM | POA: Diagnosis present

## 2023-05-02 LAB — GLUCOSE, CAPILLARY
Glucose-Capillary: 155 mg/dL — ABNORMAL HIGH (ref 70–99)
Glucose-Capillary: 129 mg/dL — ABNORMAL HIGH (ref 70–99)
Glucose-Capillary: 126 mg/dL — ABNORMAL HIGH (ref 70–99)
Glucose-Capillary: 140 mg/dL — ABNORMAL HIGH (ref 70–99)

## 2023-05-02 LAB — CBC WITH DIFFERENTIAL/PLATELET
Abs Immature Granulocytes: 1.36 10*3/uL — ABNORMAL HIGH (ref 0.00–0.07)
Basophils Absolute: 0 10*3/uL (ref 0.0–0.1)
Basophils Relative: 0 %
Eosinophils Absolute: 0 10*3/uL (ref 0.0–0.5)
Eosinophils Relative: 0 %
HCT: 30.4 % — ABNORMAL LOW (ref 36.0–46.0)
Hemoglobin: 10.3 g/dL — ABNORMAL LOW (ref 12.0–15.0)
Immature Granulocytes: 6 %
Lymphocytes Relative: 2 %
Lymphs Abs: 0.3 10*3/uL — ABNORMAL LOW (ref 0.7–4.0)
MCH: 28.3 pg (ref 26.0–34.0)
MCHC: 33.9 g/dL (ref 30.0–36.0)
MCV: 83.5 fL (ref 80.0–100.0)
Monocytes Absolute: 1.2 10*3/uL — ABNORMAL HIGH (ref 0.1–1.0)
Monocytes Relative: 6 %
Neutro Abs: 18.5 10*3/uL — ABNORMAL HIGH (ref 1.7–7.7)
Neutrophils Relative %: 86 %
Platelets: 129 10*3/uL — ABNORMAL LOW (ref 150–400)
RBC: 3.64 MIL/uL — ABNORMAL LOW (ref 3.87–5.11)
RDW: 12.7 % (ref 11.5–15.5)
WBC: 21.4 10*3/uL — ABNORMAL HIGH (ref 4.0–10.5)
nRBC: 0 % (ref 0.0–0.2)

## 2023-05-02 LAB — HIV ANTIBODY (ROUTINE TESTING W REFLEX): HIV Screen 4th Generation wRfx: NONREACTIVE

## 2023-05-02 LAB — CULTURE, BLOOD (ROUTINE X 2)

## 2023-05-02 LAB — MAGNESIUM: Magnesium: 1.3 mg/dL — ABNORMAL LOW (ref 1.7–2.4)

## 2023-05-02 LAB — BLOOD CULTURE ID PANEL (REFLEXED) - BCID2

## 2023-05-02 LAB — COMPREHENSIVE METABOLIC PANEL
ALT: 31 U/L (ref 0–44)
AST: 21 U/L (ref 15–41)
Albumin: 2.7 g/dL — ABNORMAL LOW (ref 3.5–5.0)
Alkaline Phosphatase: 58 U/L (ref 38–126)
Anion gap: 15 (ref 5–15)
BUN: 29 mg/dL — ABNORMAL HIGH (ref 8–23)
CO2: 21 mmol/L — ABNORMAL LOW (ref 22–32)
Calcium: 8.1 mg/dL — ABNORMAL LOW (ref 8.9–10.3)
Chloride: 95 mmol/L — ABNORMAL LOW (ref 98–111)
Creatinine, Ser: 2.06 mg/dL — ABNORMAL HIGH (ref 0.44–1.00)
GFR, Estimated: 26 mL/min — ABNORMAL LOW (ref >60)
Glucose, Bld: 146 mg/dL — ABNORMAL HIGH (ref 70–99)
Potassium: 3.2 mmol/L — ABNORMAL LOW (ref 3.5–5.1)
Sodium: 131 mmol/L — ABNORMAL LOW (ref 135–145)
Total Bilirubin: 0.9 mg/dL (ref 0.3–1.2)
Total Protein: 5.5 g/dL — ABNORMAL LOW (ref 6.5–8.1)

## 2023-05-02 LAB — LACTIC ACID, PLASMA: Lactic Acid, Venous: 1.1 mmol/L (ref 0.5–1.9)

## 2023-05-02 MED ORDER — POTASSIUM CHLORIDE 20 MEQ PO PACK
40.0000 meq | PACK | Freq: Once | ORAL | Status: AC
  Administered 2023-05-02: 40 meq via ORAL
  Filled 2023-05-02: qty 2

## 2023-05-02 MED ORDER — MAGNESIUM SULFATE IN D5W 1-5 GM/100ML-% IV SOLN
1.0000 g | Freq: Once | INTRAVENOUS | Status: AC
  Administered 2023-05-02: 1 g via INTRAVENOUS
  Filled 2023-05-02 (×2): qty 100

## 2023-05-02 MED ORDER — MAGNESIUM SULFATE 2 GM/50ML IV SOLN
2.0000 g | Freq: Once | INTRAVENOUS | Status: AC
  Administered 2023-05-02: 2 g via INTRAVENOUS
  Filled 2023-05-02: qty 50

## 2023-05-02 MED ORDER — POTASSIUM CHLORIDE CRYS ER 20 MEQ PO TBCR
40.0000 meq | EXTENDED_RELEASE_TABLET | Freq: Once | ORAL | Status: AC
  Administered 2023-05-02: 40 meq via ORAL
  Filled 2023-05-02: qty 2

## 2023-05-02 MED ORDER — GABAPENTIN 100 MG PO CAPS
100.0000 mg | ORAL_CAPSULE | Freq: Two times a day (BID) | ORAL | Status: DC
  Administered 2023-05-02 – 2023-05-05 (×7): 100 mg via ORAL
  Filled 2023-05-02 (×8): qty 1

## 2023-05-02 NOTE — Progress Notes (Signed)
PROGRESS NOTE    Tracy Anderson  AOZ:[id number] DOB: 1961-08-15 DOA: 05/01/2023 PCP: Nelwyn Salisbury, MD   Brief Narrative:  Patient is a 62 year old Caucasian female with past medical history significant for but limited to hyperlipidemia, non-insulin-dependent diabetes mellitus type 2, generalized anxiety disorder as well as a comorbidities presented to [facility] emergency department complaint left-sided back and abdominal pain as well as buttock pain that radiated down her leg.  She was seen by her primary care doctor who was concerned for sciatica and initiated prednisone.  At home she developed low-grade fevers and chills and continued to have nausea and left-sided back pain and abdominal pain that radiated into her left groin.  She denies any dysuria or increased urinary frequency or hesitancy but did have abdominal pain.  In the ED she was found to have a temperature of 103 and was tachycardic with a blood pressure within normal range.  Labs revealed that she had a low bicarbonate level elevated BUN/creatinine.  UA was done and suggestive of UTI.  CT renal stone study was done and showed nonspecific bilateral perinephric stranding concerning for pyelonephritis no urinary tract calculi or evidence for hydronephrosis.  The ED she was given IV ceftriaxone and sepsis protocol was initiated and she was initiated on IV fluid hydration.  She has been doing slightly better today but renal function continues to be elevated. Cultures are pending.  Assessment and Plan:  Pyelonephritis Sepsis secondary from pyelonephritis -Initial presentation patient found febrile, tachycardic, elevated lactic acid and lab work showed leukocytosis 28.  Evidence suggestive sepsis in the setting of pyelonephritis. -CBC showed evidence of leukocytosis 28.5 otherwise unremarkable and was also on Steroids PTA -UA has evidence, moderate hemoglobin, protein 100, moderate leukocyte esterase and many bacteria evidence of  UTI.  Pending urine culture and Blood Cx x2. Initial presentation lactic acid 2.1. -CT Renal Stone was ordered which showed nonspecific bilateral perinephric stranding concerning for pyelonephritis, no urinary tract calculi or evidence of hydronephrosis.   -In the Emergency department patient received ceftriaxone.  With sepsis protocoL patient being started on high flow IV fluid NS 150 cc/hr. -Continue sepsis protocol -Continue broad-spectrum antibiotics cefepime per pharmacy protocol -WBC and Lactic Acid Level Trend: Recent Labs  Lab 05/01/23 1819 05/01/23 2102 05/02/23 0129  WBC 28.5*  --  21.4*  LATICACIDVEN  --    < > 1.1   < > = values in this interval not displayed.  -Follow-up with blood and urine culture result for appropriate antibiotic guidance. -CBC, CMP, mag, Phos in a.m.   Hyponatremia -CMP showed corrected sodium is 130 with high blood glucose 230. -Acute onset of hyponatremia in the setting of AKI and likely Hypovolemic  -If morning a.m. sodium has been corrected more than 4 to 6 meq will change IV fluid to LR. -Given that development of acute hyponatremia the setting of acute kidney injury not checking urinary osmolarity, urine sodium and serum osmolarity at this time.   Prerenal AKI -CMP showed elevated creatinine 2.06. -Normal renal function at baseline -BUN/Cr Trend: Recent Labs  Lab 05/01/23 1819 05/02/23 0129  BUN 28* 29*  CREATININE 2.06* 2.06*  -Acute kidney injury in the setting of sepsis tachycardia decreasing effective blood flow to kidney and hypoperfusion.  Not concerning for intrinsic renal and postrenal obstruction. -CT abdomen pelvis showed evidence of pyelonephritis.  No concern for hydronephrosis and urinary tract calculi. -Continue IV fluid with Normal Saline 150 cc/hr for now. -Avoid Nephrotoxic Medications, Contrast Dyes, Hypotension and Dehydration to  Ensure Adequate Renal Perfusion and will need to Renally Adjust Meds -Continue to Monitor  and Trend Renal Function carefully and repeat CMP in the AM   Hypokalemia -Patient's K+ Level Trend: Recent Labs  Lab 05/01/23 1819 05/02/23 0129  K 3.7 3.2*  -Replete with po Kcl 40 mEQ x2 -Continue to Monitor and Replete as Necessary -Repeat CMP in the AM   Hypomagnesemia -Patient's Mag Level Trend: Recent Labs  Lab 05/02/23 0129  MG 1.3*  -Replete with IV Mag Sulfate 2 grams x2 -Continue to Monitor and Replete as Necessary -Repeat Mag in the AM   Non anion gap metabolic acidosis -Slightly low bicarb 21.  Normal anion gap. -Anion gap 12 acidosis in setting of AKI as well. -Continue to monitor.  No indication for bicarb drip. -Patient's CO2 is now 1, anion gap is 15, chloride level is 95    Hyperlipidemia -Continue to Hold Statin for now   General Anxiety Disorder -C/w Resumed home bupropion 150 mg daily    Diabetes mellitus type 2-non-insulin-dependent -Most recent HbA1c is 6. -Patient reported taking Mounjaro 12.5 mg once a week. -Continue Diabetic Diet, sliding scale SSI and check POC 3 times daily with meal and bedtime. -Continue to Monitor and CBG and Glucose Trend: Recent Labs  Lab 05/01/23 1819 05/02/23 0129 05/02/23 0906 05/02/23 1155  GLUCOSE 216* 146*  --   --   GLUCAP  --   --    < > 129*   < > = values in this interval not displayed.   Normocytic Anemia -Likely Hemoconcentrated on Admission and had a Dilutional drop -Hgb/Hct Trend: Recent Labs  Lab 05/01/23 1819 05/02/23 0129  HGB 12.4 10.3*  HCT 36.2 30.4*  MCV 84.4 83.5  -Check Anemia Panel in the AM -Continue to Monitor for S/Sx of Bleeding; No overt bleeding noted -Repeat CBC in the AM  Thrombocytopenia -In the Setting of Sepsis -Patient's Plt Count Trend: Recent Labs  Lab 05/01/23 1819 05/02/23 0129  PLT 179 129*  -Continue to Monitor and Trend and Repeat CBC in the AM  Left Buttock pain with radiation down her leg -Turn for sciatica -Initiate gabapentin 100 mg twice  daily -CT Scan done and showed "Nonspecific bilateral perinephric stranding. Correlate for signs of urinary tract infection. No urinary tract calculi or hydronephrosis. Aortic Atherosclerosis"  Hypoalbuminemia -Patient's Albumin Trend: Recent Labs  Lab 05/01/23 1819 05/02/23 0129  ALBUMIN 4.0 2.7*  -Continue to Monitor and Trend and repeat CMP in the AM DVT prophylaxis: heparin injection 5,000 Units Start: 05/01/23 2315 SCDs Start: 05/01/23 2227    Code Status: Full Code Family Communication: No family present at bedside  Disposition Plan:  Level of care: Med-Surg Status is: Observation The patient will require care spanning > 2 midnights and should be moved to inpatient because: Renal Fxn remains elevated   Consultants:  None  Procedures:  As Delineated as above  Antimicrobials:  Anti-infectives (From admission, onward)    Start     Dose/Rate Route Frequency Ordered Stop   05/01/23 2315  ceFEPIme (MAXIPIME) 2 g in sodium chloride 0.9 % 100 mL IVPB        2 g 200 mL/hr over 30 Minutes Intravenous Daily at bedtime 05/01/23 2228     05/01/23 2100  cefTRIAXone (ROCEPHIN) 2 g in sodium chloride 0.9 % 100 mL IVPB        2 g 200 mL/hr over 30 Minutes Intravenous  Once 05/01/23 2049 05/01/23 2148   05/01/23 2030  cefTRIAXone (  ROCEPHIN) 1 g in sodium chloride 0.9 % 100 mL IVPB  Status:  Discontinued        1 g 200 mL/hr over 30 Minutes Intravenous  Once 05/01/23 2019 05/01/23 2049       Subjective: Seen And examined at bedside she is doing maybe a little better.  Still feeling weak and having some pain from her left buttock into her leg.  No nausea or vomiting.  Denies any lightheadedness or dizziness.  No other concerns or complaints at this time.  Objective: Vitals:   05/02/23 0446 05/02/23 0500 05/02/23 0718 05/02/23 1505  BP: 110/67  136/72 (!) 157/96  Pulse: 90  93 78  Resp:   16 16  Temp: 98.1 F (36.7 C)  98.7 F (37.1 C) 98.1 F (36.7 C)  TempSrc: Oral  Oral  Oral  SpO2: 99%  97% 95%  Weight:  74.8 kg    Height:        Intake/Output Summary (Last 24 hours) at 05/02/2023 1606 Last data filed at 05/02/2023 0548 Gross per 24 hour  Intake 1189.02 ml  Output --  Net 1189.02 ml   Filed Weights   05/01/23 1817 05/02/23 0500  Weight: 74.8 kg 74.8 kg   Examination: Physical Exam:  Constitutional: WN/WD occasion female who appears fatigued Respiratory: Diminished to auscultation bilaterally, no wheezing, rales, rhonchi or crackles. Normal respiratory effort and patient is not tachypenic. No accessory muscle use.  Unlabored breathing Cardiovascular: RRR, no murmurs / rubs / gallops. S1 and S2 auscultated. No extremity edema.  Abdomen: Soft, non-tender, non-distended. Bowel sounds positive.  GU: Deferred. Musculoskeletal: No clubbing / cyanosis of digits/nails. No joint deformity upper and lower extremities.   Skin: No rashes, lesions, ulcers on limited skin evaluation. No induration; Warm and dry.  Neurologic: CN 2-12 grossly intact with no focal deficits. Romberg sign and cerebellar reflexes not assessed.  Psychiatric: Normal judgment and insight. Alert and oriented x 3. Normal mood and appropriate affect.   Data Reviewed: I have personally reviewed following labs and imaging studies  CBC: Recent Labs  Lab 05/01/23 1819 05/02/23 0129  WBC 28.5* 21.4*  NEUTROABS  --  18.5*  HGB 12.4 10.3*  HCT 36.2 30.4*  MCV 84.4 83.5  PLT 179 129*   Basic Metabolic Panel: Recent Labs  Lab 05/01/23 1819 05/02/23 0129  NA 128* 131*  K 3.7 3.2*  CL 94* 95*  CO2 21* 21*  GLUCOSE 216* 146*  BUN 28* 29*  CREATININE 2.06* 2.06*  CALCIUM 9.1 8.1*  MG  --  1.3*   GFR: Estimated Creatinine Clearance: 29.1 mL/min (A) (by C-G formula based on SCr of 2.06 mg/dL (H)). Liver Function Tests: Recent Labs  Lab 05/01/23 1819 05/02/23 0129  AST 18 21  ALT 29 31  ALKPHOS 66 58  BILITOT 1.1 0.9  PROT 7.0 5.5*  ALBUMIN 4.0 2.7*   No results for  input(s): "LIPASE", "AMYLASE" in the last 168 hours. No results for input(s): "AMMONIA" in the last 168 hours. Coagulation Profile: Recent Labs  Lab 05/01/23 2246  INR 1.4*   Cardiac Enzymes: No results for input(s): "CKTOTAL", "CKMB", "CKMBINDEX", "TROPONINI" in the last 168 hours. BNP (last 3 results) No results for input(s): "PROBNP" in the last 8760 hours. HbA1C: No results for input(s): "HGBA1C" in the last 72 hours. CBG: Recent Labs  Lab 05/02/23 0906 05/02/23 1155  GLUCAP 155* 129*   Lipid Profile: No results for input(s): "CHOL", "HDL", "LDLCALC", "TRIG", "CHOLHDL", "LDLDIRECT" in the last  72 hours. Thyroid Function Tests: No results for input(s): "TSH", "T4TOTAL", "FREET4", "T3FREE", "THYROIDAB" in the last 72 hours. Anemia Panel: No results for input(s): "VITAMINB12", "FOLATE", "FERRITIN", "TIBC", "IRON", "RETICCTPCT" in the last 72 hours. Sepsis Labs: Recent Labs  Lab 05/01/23 2102 05/01/23 2242 05/01/23 2246 05/01/23 2312 05/02/23 0129  PROCALCITON  --   --  24.60  --   --   LATICACIDVEN 2.1* 1.8  --  1.8 1.1    No results found for this or any previous visit (from the past 240 hour(s)).   Radiology Studies: CT Renal Stone Study  Result Date: 05/01/2023 CLINICAL DATA:  Abdominal/flank pain, stone suspected. Right buttock pain radiating down the right leg. Fatigue and nausea. EXAM: CT ABDOMEN AND PELVIS WITHOUT CONTRAST TECHNIQUE: Multidetector CT imaging of the abdomen and pelvis was performed following the standard protocol without IV contrast. RADIATION DOSE REDUCTION: This exam was performed according to the departmental dose-optimization program which includes automated exposure control, adjustment of the mA and/or kV according to patient size and/or use of iterative reconstruction technique. COMPARISON:  CT abdomen and pelvis 05/10/2010 FINDINGS: Lower chest: No acute abnormality. Hepatobiliary: No focal liver abnormality is seen. Status post  cholecystectomy. No biliary dilatation. Pancreas: Unremarkable. Spleen: Unremarkable. Adrenals/Urinary Tract: Unremarkable adrenal glands. No renal calculi or hydronephrosis. New moderate bilateral perinephric stranding. Unremarkable bladder. Stomach/Bowel: There is a small sliding hiatal hernia. There is no evidence of bowel obstruction or inflammation. The appendix is unremarkable. Vascular/Lymphatic: Mild abdominal aortic atherosclerosis without aneurysm. No enlarged lymph nodes. Reproductive: Uterus and bilateral adnexa are unremarkable. Other: No ascites or pneumoperitoneum. Musculoskeletal: No suspicious osseous lesion. Advanced lower lumbar facet arthrosis with grade 1 anterolisthesis at L4-5. IMPRESSION: 1. Nonspecific bilateral perinephric stranding. Correlate for signs of urinary tract infection. 2. No urinary tract calculi or hydronephrosis. 3.  Aortic Atherosclerosis (ICD10-I70.0). Electronically Signed   By: Sebastian Ache M.D.   On: 05/01/2023 20:03    Scheduled Meds:  buPROPion  150 mg Oral Daily   gabapentin  100 mg Oral BID   heparin  5,000 Units Subcutaneous Q8H   insulin aspart  0-6 Units Subcutaneous TID WC   sodium chloride flush  3 mL Intravenous Q12H   Continuous Infusions:  sodium chloride 150 mL/hr (05/02/23 1126)   sodium chloride     ceFEPime (MAXIPIME) IV 2 g (05/01/23 2306)    LOS: 0 days   Marguerita Merles, DO [facility] Available via Epic secure chat 7am-7pm After these hours, please refer to coverage provider listed on [email or web address] 05/02/2023, 4:06 PM

## 2023-05-02 NOTE — Progress Notes (Addendum)
PHARMACY - PHYSICIAN COMMUNICATION CRITICAL VALUE ALERT - BLOOD CULTURE IDENTIFICATION (BCID)  Tracy Anderson is an 62 y.o. female who presented to [facility] on 05/01/2023 with a chief complaint of left sided back pain and abdominal pain with UA suggestive of UTI and CT concerning for pyelonephritis. Currently on cefepime 2g IV daily. WBC 28.4>21.4, sCr 2.06, and afebrile for entire day (6/26). Biofire showed 4/4 bottles growing E. Coli with no resistance genes detected.  Name of physician (or Provider) Contacted: Dr. Marland Mcalpine  Current antibiotics: Cefepime 2g IV every 24 hours  Changes to prescribed antibiotics recommended:  Recommended de-escalating to ceftriaxone, but since patient presented septic on 6/25, will wait to transition as she clinically continues to improve  Results for orders placed or performed during the hospital encounter of 05/01/23  Blood Culture ID Panel (Reflexed) (Collected: 05/01/2023  9:30 PM)  Result Value Ref Range   Enterococcus faecalis NOT DETECTED NOT DETECTED   Enterococcus Faecium NOT DETECTED NOT DETECTED   Listeria monocytogenes NOT DETECTED NOT DETECTED   Staphylococcus species NOT DETECTED NOT DETECTED   Staphylococcus aureus (BCID) NOT DETECTED NOT DETECTED   Staphylococcus epidermidis NOT DETECTED NOT DETECTED   Staphylococcus lugdunensis NOT DETECTED NOT DETECTED   Streptococcus species NOT DETECTED NOT DETECTED   Streptococcus agalactiae NOT DETECTED NOT DETECTED   Streptococcus pneumoniae NOT DETECTED NOT DETECTED   Streptococcus pyogenes NOT DETECTED NOT DETECTED   A.calcoaceticus-baumannii NOT DETECTED NOT DETECTED   Bacteroides fragilis NOT DETECTED NOT DETECTED   Enterobacterales DETECTED (A) NOT DETECTED   Enterobacter cloacae complex NOT DETECTED NOT DETECTED   Escherichia coli DETECTED (A) NOT DETECTED   Klebsiella aerogenes NOT DETECTED NOT DETECTED   Klebsiella oxytoca NOT DETECTED NOT DETECTED   Klebsiella pneumoniae NOT  DETECTED NOT DETECTED   Proteus species NOT DETECTED NOT DETECTED   Salmonella species NOT DETECTED NOT DETECTED   Serratia marcescens NOT DETECTED NOT DETECTED   Haemophilus influenzae NOT DETECTED NOT DETECTED   Neisseria meningitidis NOT DETECTED NOT DETECTED   Pseudomonas aeruginosa NOT DETECTED NOT DETECTED   Stenotrophomonas maltophilia NOT DETECTED NOT DETECTED   Candida albicans NOT DETECTED NOT DETECTED   Candida auris NOT DETECTED NOT DETECTED   Candida glabrata NOT DETECTED NOT DETECTED   Candida krusei NOT DETECTED NOT DETECTED   Candida parapsilosis NOT DETECTED NOT DETECTED   Candida tropicalis NOT DETECTED NOT DETECTED   Cryptococcus neoformans/gattii NOT DETECTED NOT DETECTED   CTX-M ESBL NOT DETECTED NOT DETECTED   Carbapenem resistance IMP NOT DETECTED NOT DETECTED   Carbapenem resistance KPC NOT DETECTED NOT DETECTED   Carbapenem resistance NDM NOT DETECTED NOT DETECTED   Carbapenem resist OXA 48 LIKE NOT DETECTED NOT DETECTED   Carbapenem resistance VIM NOT DETECTED NOT DETECTED    Thanks,  Arabella Merles, PharmD. [facility] Acute Care PGY-1 05/02/2023 6:53 PM

## 2023-05-02 NOTE — Hospital Course (Addendum)
Patient is a 62 year old Caucasian female with past medical history significant for but limited to hyperlipidemia, non-insulin-dependent diabetes mellitus type 2, generalized anxiety disorder as well as a comorbidities presented to [facility] emergency department complaint left-sided back and abdominal pain as well as buttock pain that radiated down her leg.  She was seen by her primary care doctor who was concerned for sciatica and initiated prednisone.  At home she developed low-grade fevers and chills and continued to have nausea and left-sided back pain and abdominal pain that radiated into her left groin.  She denies any dysuria or increased urinary frequency or hesitancy but did have abdominal pain.  In the ED she was found to have a temperature of 103 and was tachycardic with a blood pressure within normal range.  Labs revealed that she had a low bicarbonate level elevated BUN/creatinine.  UA was done and suggestive of UTI.  CT renal stone study was done and showed nonspecific bilateral perinephric stranding concerning for pyelonephritis no urinary tract calculi or evidence for hydronephrosis.  The ED she was given IV ceftriaxone and sepsis protocol was initiated and she was initiated on IV fluid hydration and antibiotics were escalated to IV cefepime but now have de-escalated back to ceftriaxone.  She has been doing slightly better today but renal function continues to be elevated. Cultures E. coli in her blood cultures and in her urine.  Currently awaiting full sensitivities we will continue antibiotic coverage.  Patient continues to complain of some left-sided buttock and flank pain that radiates down her leg.  Will obtain further MRI imaging and also apply a K-pad.  She is slowly improving but renal function continues to be elevated so nephrology recommends increasing fluids.  Urology was consulted given that she spiked another temperature and they evaluated the patient skin intact there is nothing  drainable and no abscesses and recommending continuing antibiotic course.  Blood cultures x 2 have been repeated  Assessment and Plan:  Pyelonephritis Sepsis secondary from E. coli pyelonephritis and E. coli bacteremia -Initial presentation patient found febrile, tachycardic, elevated lactic acid and lab work showed leukocytosis 28.  Evidence suggestive sepsis in the setting of pyelonephritis but further workup also reveals that she is going to E. coli bacteremia 4 out of 4 bottles. -Sepsis physiology was improving however she spiked a temperature again this afternoon so we repeated blood cultures x 2 -CBC showed evidence of leukocytosis 28.5 otherwise unremarkable and was also on Steroids PTA -UA has evidence, moderate hemoglobin, protein 100, moderate leukocyte esterase and many bacteria evidence of UTI.  Urine culture done and showing 40,000 colony forming units of E. coli with sensitivities pending and blood cultures done x 2 showing 4 out of 4 bottles of E. coli -Initial presentation lactic acid 2.1. -CT Renal Stone was ordered which showed nonspecific bilateral perinephric stranding concerning for pyelonephritis, no urinary tract calculi or evidence of hydronephrosis.   -In the Emergency department patient received ceftriaxone.  With sepsis protocoL patient being started on high flow IV fluid NS 150 cc/hr and we have now weaned this to 75 MLS per hour -Continue sepsis protocol -Continued broad-spectrum antibiotics cefepime per pharmacy protocol and this has now been transitioned to IV ceftriaxone -WBC and Lactic Acid Level Trend: Recent Labs  Lab 05/01/23 1819 05/01/23 2102 05/02/23 0129 05/03/23 0443 05/04/23 0335  WBC 28.5*  --  21.4* 11.9* 7.5  LATICACIDVEN  --    < > 1.1  --   --    < > =  values in this interval not displayed.  -Follow-up with blood and urine culture result for appropriate antibiotic guidance as sensitivities are still pending -This was discussed with urology who  feels that there is no abscesses to drain and recommends continuing current course for now -CBC, CMP, mag, Phos in a.m.   Hyponatremia -CMP showed corrected sodium is 130 with high blood glucose 230. -Acute onset of hyponatremia in the setting of AKI and likely Hypovolemic  -If morning a.m. sodium has been corrected more than 4 to 6 meq will change IV fluid to LR. -Na+ Trend: Recent Labs  Lab 05/01/23 1819 05/02/23 0129 05/03/23 0443 05/04/23 0335  NA 128* 131* 130* 135  -Given that development of acute hyponatremia the setting of acute kidney injury not checking urinary osmolarity, urine sodium and serum osmolarity at this time.   Prerenal AKI -CMP showed elevated creatinine 2.06. -Normal renal function at baseline -BUN/Cr Trend: Recent Labs  Lab 05/01/23 1819 05/02/23 0129 05/03/23 0443 05/04/23 0335  BUN 28* 29* 28* 24*  CREATININE 2.06* 2.06* 1.99* 1.75*  -Acute kidney injury in the setting of sepsis tachycardia decreasing effective blood flow to kidney and hypoperfusion.  Not concerning for intrinsic renal and postrenal obstruction but will check bladder scan to evaluate and if necessary a renal ultrasound -CT abdomen pelvis showed evidence of pyelonephritis.  No concern for hydronephrosis and urinary tract calculi. -Continue IV fluid but increase Normal Saline 75 mL/hr to 100 mL/h per nephrology recommendations -Urine osmolality, urine sodium, urine creatinine to be ordered and may need nephrology involvement given her minimal improvement -Avoid Nephrotoxic Medications, Contrast Dyes, Hypotension and Dehydration to Ensure Adequate Renal Perfusion and will need to Renally Adjust Meds -Continue to Monitor and Trend Renal Function carefully and repeat CMP in the AM  -Discussed with nephrology about the patient's case and they recommended increasing her fluids and checking CK level recommended continuing the course for now  Hypokalemia -Patient's K+ Level Trend: Recent Labs   Lab 05/01/23 1819 05/02/23 0129 05/03/23 0443 05/04/23 0335  K 3.7 3.2* 4.2 3.9  -Continue to Monitor and Replete as Necessary -Repeat CMP in the AM   Hypomagnesemia -Patient's Mag Level Trend: Recent Labs  Lab 05/02/23 0129 05/03/23 0443 05/04/23 0335  MG 1.3* 2.3 2.3  -Continue to Monitor and Replete as Necessary -Repeat Mag in the AM   Non anion gap metabolic acidosis -Improving but still low as CO2 is 21, anion gap is 9, chloride level 105    Hyperlipidemia -Continue to Hold Statin for now   General Anxiety Disorder -C/w Resumed home bupropion 150 mg daily    Diabetes mellitus type 2-non-insulin-dependent -Most recent HbA1c is 6. -Patient reported taking Mounjaro 12.5 mg once a week. -Continue Diabetic Diet, sliding scale SSI and check POC 3 times daily with meal and bedtime. -Continue to Monitor and CBG and Glucose Trend: Recent Labs  Lab 05/01/23 1819 05/02/23 0129 05/02/23 0906 05/03/23 0443 05/03/23 0810 05/04/23 0335 05/04/23 0754 05/04/23 1719  GLUCOSE 216* 146*  --  128*  --  113*  --   --   GLUCAP  --   --    < >  --    < >  --    < > 184*   < > = values in this interval not displayed.   Normocytic Anemia -Likely Hemoconcentrated on Admission and had a Dilutional drop -Hgb/Hct Trend: Recent Labs  Lab 05/01/23 1819 05/02/23 0129 05/03/23 0443 05/04/23 0335  HGB 12.4 10.3* 11.0* 9.9*  HCT 36.2 30.4* 33.0* 29.9*  MCV 84.4 83.5 85.5 85.2  -Check Anemia Panel showed an iron level of 7, UIBC of 192, TIBC 199, saturation ratios of 4%, ferritin level 413, folate level of 27.7 and a vitamin B12 of 293 -Continue to Monitor for S/Sx of Bleeding; No overt bleeding noted -Repeat CBC in the AM  Thrombocytopenia -In the Setting of Sepsis -Patient's Plt Count Trend: Recent Labs  Lab 05/01/23 1819 05/02/23 0129 05/03/23 0443 05/04/23 0335  PLT 179 129* 128* 125*  -Continue to Monitor and Trend and Repeat CBC in the AM  Left Buttock pain with  radiation down her leg -There is concern for sciatica -Initiate gabapentin 100 mg twice daily -CT Scan done and showed "Nonspecific bilateral perinephric stranding. Correlate for signs of urinary tract infection. No urinary tract calculi or hydronephrosis. Aortic Atherosclerosis" -Given her persistence we will obtain a lumbar MRI as well as a sacrum MRI for further evaluation given her infection -MRI done and showed "Diffuse intramuscular edema involving the lower posterior paraspinous musculature, left greater than right. Findings are nonspecific, but could reflect changes of acute myositis, which could be either infectious or inflammatory in nature. Changes of muscular injury/strain would be the primary differential consideration. No loculated collections. No other evidence for acute infection within the lumbar spine or sacrum. 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 Additional moderately advanced lower lumbar facet hypertrophy, which could contribute to lower back pain. Moderate volume free fluid within the pelvis, nonspecific.  Fibroid uterus." -Ordered K-pad  Hypoalbuminemia -Patient's Albumin Trend: Recent Labs  Lab 05/01/23 1819 05/02/23 0129 05/03/23 0443 05/04/23 0335  ALBUMIN 4.0 2.7* 2.5* 2.3*  -Continue to Monitor and Trend and repeat CMP in the AM

## 2023-05-03 ENCOUNTER — Inpatient Hospital Stay (HOSPITAL_COMMUNITY): Payer: No Typology Code available for payment source

## 2023-05-03 DIAGNOSIS — R7881 Bacteremia: Secondary | ICD-10-CM

## 2023-05-03 DIAGNOSIS — B9620 Unspecified Escherichia coli [E. coli] as the cause of diseases classified elsewhere: Secondary | ICD-10-CM

## 2023-05-03 DIAGNOSIS — N12 Tubulo-interstitial nephritis, not specified as acute or chronic: Secondary | ICD-10-CM | POA: Diagnosis not present

## 2023-05-03 DIAGNOSIS — M5410 Radiculopathy, site unspecified: Secondary | ICD-10-CM

## 2023-05-03 DIAGNOSIS — E785 Hyperlipidemia, unspecified: Secondary | ICD-10-CM | POA: Diagnosis not present

## 2023-05-03 DIAGNOSIS — N179 Acute kidney failure, unspecified: Secondary | ICD-10-CM | POA: Diagnosis not present

## 2023-05-03 DIAGNOSIS — F411 Generalized anxiety disorder: Secondary | ICD-10-CM | POA: Diagnosis not present

## 2023-05-03 LAB — GLUCOSE, CAPILLARY
Glucose-Capillary: 118 mg/dL — ABNORMAL HIGH (ref 70–99)
Glucose-Capillary: 133 mg/dL — ABNORMAL HIGH (ref 70–99)
Glucose-Capillary: 156 mg/dL — ABNORMAL HIGH (ref 70–99)

## 2023-05-03 LAB — IRON AND TIBC
Iron: 7 ug/dL — ABNORMAL LOW (ref 28–170)
Saturation Ratios: 4 % — ABNORMAL LOW (ref 10.4–31.8)
TIBC: 199 ug/dL — ABNORMAL LOW (ref 250–450)
UIBC: 192 ug/dL

## 2023-05-03 LAB — CBC WITH DIFFERENTIAL/PLATELET
Abs Immature Granulocytes: 0.13 10*3/uL — ABNORMAL HIGH (ref 0.00–0.07)
Basophils Absolute: 0 10*3/uL (ref 0.0–0.1)
Basophils Relative: 0 %
Eosinophils Absolute: 0 10*3/uL (ref 0.0–0.5)
Eosinophils Relative: 0 %
HCT: 33 % — ABNORMAL LOW (ref 36.0–46.0)
Hemoglobin: 11 g/dL — ABNORMAL LOW (ref 12.0–15.0)
Immature Granulocytes: 1 %
Lymphocytes Relative: 1 %
Lymphs Abs: 0.2 10*3/uL — ABNORMAL LOW (ref 0.7–4.0)
MCH: 28.5 pg (ref 26.0–34.0)
MCHC: 33.3 g/dL (ref 30.0–36.0)
MCV: 85.5 fL (ref 80.0–100.0)
Monocytes Absolute: 0.3 10*3/uL (ref 0.1–1.0)
Monocytes Relative: 3 %
Neutro Abs: 11.3 10*3/uL — ABNORMAL HIGH (ref 1.7–7.7)
Neutrophils Relative %: 95 %
Platelets: 128 10*3/uL — ABNORMAL LOW (ref 150–400)
RBC: 3.86 MIL/uL — ABNORMAL LOW (ref 3.87–5.11)
RDW: 13.3 % (ref 11.5–15.5)
WBC: 11.9 10*3/uL — ABNORMAL HIGH (ref 4.0–10.5)
nRBC: 0 % (ref 0.0–0.2)

## 2023-05-03 LAB — COMPREHENSIVE METABOLIC PANEL
ALT: 47 U/L — ABNORMAL HIGH (ref 0–44)
AST: 37 U/L (ref 15–41)
Albumin: 2.5 g/dL — ABNORMAL LOW (ref 3.5–5.0)
Alkaline Phosphatase: 88 U/L (ref 38–126)
Anion gap: 8 (ref 5–15)
BUN: 28 mg/dL — ABNORMAL HIGH (ref 8–23)
CO2: 20 mmol/L — ABNORMAL LOW (ref 22–32)
Calcium: 7.9 mg/dL — ABNORMAL LOW (ref 8.9–10.3)
Chloride: 102 mmol/L (ref 98–111)
Creatinine, Ser: 1.99 mg/dL — ABNORMAL HIGH (ref 0.44–1.00)
GFR, Estimated: 28 mL/min — ABNORMAL LOW (ref >60)
Glucose, Bld: 128 mg/dL — ABNORMAL HIGH (ref 70–99)
Potassium: 4.2 mmol/L (ref 3.5–5.1)
Sodium: 130 mmol/L — ABNORMAL LOW (ref 135–145)
Total Bilirubin: 0.7 mg/dL (ref 0.3–1.2)
Total Protein: 6 g/dL — ABNORMAL LOW (ref 6.5–8.1)

## 2023-05-03 LAB — URINE CULTURE

## 2023-05-03 LAB — CULTURE, BLOOD (ROUTINE X 2): Special Requests: ADEQUATE

## 2023-05-03 LAB — RETICULOCYTES
Immature Retic Fract: 16.4 % — ABNORMAL HIGH (ref 2.3–15.9)
RBC.: 3.78 MIL/uL — ABNORMAL LOW (ref 3.87–5.11)
Retic Count, Absolute: 41.6 10*3/uL (ref 19.0–186.0)
Retic Ct Pct: 1.1 % (ref 0.4–3.1)

## 2023-05-03 LAB — PHOSPHORUS: Phosphorus: 2 mg/dL — ABNORMAL LOW (ref 2.5–4.6)

## 2023-05-03 LAB — MAGNESIUM: Magnesium: 2.3 mg/dL (ref 1.7–2.4)

## 2023-05-03 LAB — FERRITIN: Ferritin: 413 ng/mL — ABNORMAL HIGH (ref 11–307)

## 2023-05-03 LAB — VITAMIN B12: Vitamin B-12: 293 pg/mL (ref 180–914)

## 2023-05-03 LAB — FOLATE: Folate: 27.7 ng/mL (ref >5.9)

## 2023-05-03 MED ORDER — SODIUM PHOSPHATES 45 MMOLE/15ML IV SOLN
15.0000 mmol | Freq: Once | INTRAVENOUS | Status: AC
  Administered 2023-05-03: 15 mmol via INTRAVENOUS
  Filled 2023-05-03: qty 5

## 2023-05-03 MED ORDER — HEPARIN SODIUM (PORCINE) 5000 UNIT/ML IJ SOLN
5000.0000 [IU] | Freq: Three times a day (TID) | INTRAMUSCULAR | Status: DC
  Administered 2023-05-03 – 2023-05-06 (×8): 5000 [IU] via SUBCUTANEOUS
  Filled 2023-05-03 (×8): qty 1

## 2023-05-03 MED ORDER — SODIUM CHLORIDE 0.9 % IV SOLN
2.0000 g | INTRAVENOUS | Status: DC
  Administered 2023-05-03 – 2023-05-06 (×4): 2 g via INTRAVENOUS
  Filled 2023-05-03 (×4): qty 20

## 2023-05-03 MED ORDER — SODIUM CHLORIDE 0.9 % IV SOLN: INTRAVENOUS | Status: DC

## 2023-05-03 NOTE — TOC Initial Note (Signed)
Transition of Care [facility]) - Initial/Assessment Note    Patient Details  Name: Tracy Anderson MRN: [id number] Date of Birth: 04-18-1961  [facility]) CM/SW Contact:    Kingsley Plan, RN Phone Number: 05/03/2023, 3:21 PM  Clinical Narrative:                 Patient from home with husband.   PCP is Dr Clent Ridges, Patient had been seeing Gwendlyn Deutscher at [facility] but wants to keep Dr Clent Ridges as PCP. Patient has transportation to appointments. Prior to admission patient did not use any DME.   No discharge needs identified at this time   Expected Discharge Plan: Home/Self Care Barriers to Discharge: Continued Medical Work up   Patient Goals and CMS Choice Patient states their goals for this hospitalization and ongoing recovery are:: to return to home     [place] ownership interest in [facility].provided to:: Patient    Expected Discharge Plan and Services   Discharge Planning Services: CM Consult   Living arrangements for the past 2 months: Single Family Home                 DME Arranged: N/A DME Agency: NA       HH Arranged: NA          Prior Living Arrangements/Services Living arrangements for the past 2 months: Single Family Home Lives with:: Spouse Patient language and need for interpreter reviewed:: Yes Do you feel safe going back to the place where you live?: Yes      Need for Family Participation in Patient Care: Yes (Comment) Care giver support system in place?: Yes (comment)   Criminal Activity/Legal Involvement Pertinent to Current Situation/Hospitalization: No - Comment as needed  Activities of Daily Living Home Assistive Devices/Equipment: None ADL Screening (condition at time of admission) Patient's cognitive ability adequate to safely complete daily activities?: No Is the patient deaf or have difficulty hearing?: No Does the patient have difficulty seeing, even when wearing glasses/contacts?: Yes Does the patient have  difficulty concentrating, remembering, or making decisions?: No Patient able to express need for assistance with ADLs?: Yes Does the patient have difficulty dressing or bathing?: No Independently performs ADLs?: Yes (appropriate for developmental age) Does the patient have difficulty walking or climbing stairs?: No Weakness of Legs: Both Weakness of Arms/Hands: None  Permission Sought/Granted   Permission granted to share information with : No              Emotional Assessment Appearance:: Appears stated age Attitude/Demeanor/Rapport: Engaged Affect (typically observed): Accepting Orientation: : Oriented to Self, Oriented to Place, Oriented to  Time, Oriented to Situation Alcohol / Substance Use: Not Applicable Psych Involvement: No (comment)  Admission diagnosis:  Pyelonephritis [N12] Patient Active Problem List   Diagnosis Date Noted   Pyelonephritis 05/01/2023   Sepsis (HCC) 05/01/2023   Hyponatremia 05/01/2023   AKI (acute kidney injury) (HCC) 05/01/2023   GAD (generalized anxiety disorder) 05/01/2023   Loss of libido 08/04/2022   Depression 12/04/2017   Vitamin D deficiency 09/13/2017   Other fatigue 07/17/2017   Shortness of breath on exertion 07/17/2017   Abnormal ECG 07/17/2017   Essential hypertension 07/17/2017   Class 1 obesity with serious comorbidity and body mass index (BMI) of 30.0 to 30.9 in adult 07/17/2017   Type 2 diabetes mellitus without complication, without long-term current use of insulin (HCC) 04/23/2017   SKIN TAG 05/25/2010   SKIN LESION 05/25/2010   FIBROIDS, UTERUS 05/17/2010  FLANK PAIN, LEFT 04/27/2010   RIB PAIN 02/16/2010   THYROID NODULE 04/26/2009   MENOPAUSAL SYNDROME 04/26/2009   HLD (hyperlipidemia) 08/07/2007   Depression with anxiety 08/07/2007   ASTHMA 08/05/2007   PCP:  Nelwyn Salisbury, MD Pharmacy:   [facility] [id number] - [address], [place] - [address] [place] [address] Phone:  [phone] Fax: [phone]  [place] [facility] [address] [place] [address] Phone: [phone] Fax: [phone]     Social Determinants of Health (SDOH) Social History: SDOH Screenings   Food Insecurity: No Food Insecurity (05/01/2023)  Housing: Low Risk  (05/01/2023)  Transportation Needs: No Transportation Needs (05/01/2023)  Utilities: Not At Risk (05/01/2023)  Alcohol Screen: Low Risk  (10/06/2021)  Depression (PHQ2-9): Low Risk  (04/28/2022)  Financial Resource Strain: Low Risk  (10/06/2021)  Physical Activity: Unknown (10/06/2021)  Social Connections: Socially Integrated (10/06/2021)  Stress: No Stress Concern Present (10/06/2021)  Tobacco Use: Medium Risk (05/01/2023)   SDOH Interventions:     Readmission Risk Interventions     No data to display

## 2023-05-03 NOTE — Progress Notes (Signed)
PROGRESS NOTE    Tracy Anderson  NUU:[id number] DOB: 02-25-1961 DOA: 05/01/2023 PCP: Nelwyn Salisbury, MD   Brief Narrative:  Patient is a 62 year old Caucasian female with past medical history significant for but limited to hyperlipidemia, non-insulin-dependent diabetes mellitus type 2, generalized anxiety disorder as well as a comorbidities presented to [facility] complaint left-sided back and abdominal pain as well as buttock pain that radiated down her leg.  She was seen by her primary care doctor who was concerned for sciatica and initiated prednisone.  At home she developed low-grade fevers and chills and continued to have nausea and left-sided back pain and abdominal pain that radiated into her left groin.  She denies any dysuria or increased urinary frequency or hesitancy but did have abdominal pain.  In the ED she was found to have a temperature of 103 and was tachycardic with a blood pressure within normal range.  Labs revealed that she had a low bicarbonate level elevated BUN/creatinine.  UA was done and suggestive of UTI.  CT renal stone study was done and showed nonspecific bilateral perinephric stranding concerning for pyelonephritis no urinary tract calculi or evidence for hydronephrosis.  The ED she was given IV ceftriaxone and sepsis protocol was initiated and she was initiated on IV fluid hydration and antibiotics were escalated to IV cefepime but now have de-escalated back to ceftriaxone.  She has been doing slightly better today but renal function continues to be elevated. Cultures E. coli in her blood cultures and in her urine.  Currently awaiting full sensitivities we will continue antibiotic coverage.  Patient continues to complain of some left-sided buttock and flank pain that radiates down her leg.  Will obtain further MRI imaging and also apply a K-pad.  Assessment and Plan:  Pyelonephritis Sepsis secondary from E. coli pyelonephritis and E. coli  bacteremia -Initial presentation patient found febrile, tachycardic, elevated lactic acid and lab work showed leukocytosis 28.  Evidence suggestive sepsis in the setting of pyelonephritis but further workup also reveals that she is going to E. coli bacteremia 4 out of 4 bottles. -CBC showed evidence of leukocytosis 28.5 otherwise unremarkable and was also on Steroids PTA -UA has evidence, moderate hemoglobin, protein 100, moderate leukocyte esterase and many bacteria evidence of UTI.  Urine culture done and showing 40,000 colony forming units of E. coli with sensitivities pending and blood cultures done x 2 showing 4 out of 4 bottles of E. coli -Initial presentation lactic acid 2.1. -CT Renal Stone was ordered which showed nonspecific bilateral perinephric stranding concerning for pyelonephritis, no urinary tract calculi or evidence of hydronephrosis.   -In the Emergency department patient received ceftriaxone.  With sepsis protocoL patient being started on high flow IV fluid NS 150 cc/hr and we have now weaned this to 75 MLS per hour -Continue sepsis protocol -Continued broad-spectrum antibiotics cefepime per pharmacy protocol and this has now been transitioned to IV ceftriaxone -WBC and Lactic Acid Level Trend: Recent Labs  Lab 05/01/23 1819 05/01/23 2102 05/02/23 0129 05/03/23 0443  WBC 28.5*  --  21.4* 11.9*  LATICACIDVEN  --    < > 1.1  --    < > = values in this interval not displayed.  -Follow-up with blood and urine culture result for appropriate antibiotic guidance as sensitivities are still pending -CBC, CMP, mag, Phos in a.m.   Hyponatremia -CMP showed corrected sodium is 130 with high blood glucose 230. -Acute onset of hyponatremia in the setting of AKI and likely Hypovolemic  -  If morning a.m. sodium has been corrected more than 4 to 6 meq will change IV fluid to LR. -Na+ Trend: Recent Labs  Lab 05/01/23 1819 05/02/23 0129 05/03/23 0443  NA 128* 131* 130*  -Given that  development of acute hyponatremia the setting of acute kidney injury not checking urinary osmolarity, urine sodium and serum osmolarity at this time.   Prerenal AKI -CMP showed elevated creatinine 2.06. -Normal renal function at baseline -BUN/Cr Trend: Recent Labs  Lab 05/01/23 1819 05/02/23 0129 05/03/23 0443  BUN 28* 29* 28*  CREATININE 2.06* 2.06* 1.99*  -Acute kidney injury in the setting of sepsis tachycardia decreasing effective blood flow to kidney and hypoperfusion.  Not concerning for intrinsic renal and postrenal obstruction but will check bladder scan to evaluate and if necessary a renal ultrasound -CT abdomen pelvis showed evidence of pyelonephritis.  No concern for hydronephrosis and urinary tract calculi. -Continue IV fluid with Normal Saline 75 mL/hr -Urine osmolality, urine sodium, urine creatinine to be ordered and may need nephrology involvement given her minimal improvement -Avoid Nephrotoxic Medications, Contrast Dyes, Hypotension and Dehydration to Ensure Adequate Renal Perfusion and will need to Renally Adjust Meds -Continue to Monitor and Trend Renal Function carefully and repeat CMP in the AM  -Will consider obtaining nephrology consult in the morning  Hypokalemia -Patient's K+ Level Trend: Recent Labs  Lab 05/01/23 1819 05/02/23 0129 05/03/23 0443  K 3.7 3.2* 4.2  -Replete with po Kcl 40 mEQ x2 pain improved -Continue to Monitor and Replete as Necessary -Repeat CMP in the AM   Hypomagnesemia -Patient's Mag Level Trend: Recent Labs  Lab 05/02/23 0129 05/03/23 0443  MG 1.3* 2.3  -Replete with IV Mag Sulfate 2 grams x2 -Continue to Monitor and Replete as Necessary -Repeat Mag in the AM   Non anion gap metabolic acidosis -Slightly low bicarb 21.  Normal anion gap. -Anion gap 12 acidosis in setting of AKI as well. -Continue to monitor.  No indication for bicarb drip. -Patient's CO2 is now 20, anion gap is 8, chloride level is 102     Hyperlipidemia -Continue to Hold Statin for now   General Anxiety Disorder -C/w Resumed home bupropion 150 mg daily    Diabetes mellitus type 2-non-insulin-dependent -Most recent HbA1c is 6. -Patient reported taking Mounjaro 12.5 mg once a week. -Continue Diabetic Diet, sliding scale SSI and check POC 3 times daily with meal and bedtime. -Continue to Monitor and CBG and Glucose Trend: Recent Labs  Lab 05/01/23 1819 05/02/23 0129 05/02/23 0906 05/03/23 0443 05/03/23 0810 05/03/23 1243  GLUCOSE 216* 146*  --  128*  --   --   GLUCAP  --   --    < >  --    < > 133*   < > = values in this interval not displayed.   Normocytic Anemia -Likely Hemoconcentrated on Admission and had a Dilutional drop -Hgb/Hct Trend: Recent Labs  Lab 05/01/23 1819 05/02/23 0129 05/03/23 0443  HGB 12.4 10.3* 11.0*  HCT 36.2 30.4* 33.0*  MCV 84.4 83.5 85.5  -Check Anemia Panel showed an iron level of 7, UIBC of 192, TIBC 199, saturation ratios of 4%, ferritin level 413, folate level of 27.7 and a vitamin B12 of 293 -Continue to Monitor for S/Sx of Bleeding; No overt bleeding noted -Repeat CBC in the AM  Thrombocytopenia -In the Setting of Sepsis -Patient's Plt Count Trend: Recent Labs  Lab 05/01/23 1819 05/02/23 0129 05/03/23 0443  PLT 179 129* 128*  -Continue to Monitor  and Trend and Repeat CBC in the AM  Left Buttock pain with radiation down her leg -There is concern for sciatica -Initiate gabapentin 100 mg twice daily -CT Scan done and showed "Nonspecific bilateral perinephric stranding. Correlate for signs of urinary tract infection. No urinary tract calculi or hydronephrosis. Aortic Atherosclerosis" -Given her persistence we will obtain a lumbar MRI as well as a sacrum MRI for further evaluation given her infection -Ordered K-pad  Hypoalbuminemia -Patient's Albumin Trend: Recent Labs  Lab 05/01/23 1819 05/02/23 0129 05/03/23 0443  ALBUMIN 4.0 2.7* 2.5*  -Continue to Monitor  and Trend and repeat CMP in the AM   DVT prophylaxis: heparin injection 5,000 Units Start: 05/03/23 1615 SCDs Start: 05/01/23 2227    Code Status: Full Code Family Communication: No family currently at bedside  Disposition Plan:  Level of care: Med-Surg Status is: Inpatient Remains inpatient appropriate because: His further workup and continuing antibiotics for her bacteremia.  Will also evaluate her for her sciatica   Consultants:  None  Procedures:  As delineated as above  Antimicrobials:  Anti-infectives (From admission, onward)    Start     Dose/Rate Route Frequency Ordered Stop   05/03/23 1200  cefTRIAXone (ROCEPHIN) 2 g in sodium chloride 0.9 % 100 mL IVPB        2 g 200 mL/hr over 30 Minutes Intravenous Every 24 hours 05/03/23 1106 05/08/23 1159   05/01/23 2315  ceFEPIme (MAXIPIME) 2 g in sodium chloride 0.9 % 100 mL IVPB  Status:  Discontinued        2 g 200 mL/hr over 30 Minutes Intravenous Daily at bedtime 05/01/23 2228 05/03/23 1106   05/01/23 2100  cefTRIAXone (ROCEPHIN) 2 g in sodium chloride 0.9 % 100 mL IVPB        2 g 200 mL/hr over 30 Minutes Intravenous  Once 05/01/23 2049 05/01/23 2148   05/01/23 2030  cefTRIAXone (ROCEPHIN) 1 g in sodium chloride 0.9 % 100 mL IVPB  Status:  Discontinued        1 g 200 mL/hr over 30 Minutes Intravenous  Once 05/01/23 2019 05/01/23 2049       Subjective: Seen and examined at bedside and she states that she is feeling little bit better today still having quite a bit of pain that radiates from her buttock into her leg.  No nausea or vomiting.  States the pain is very intermittent and also radiates into her left flank and into her groin.  No complaints this time.  Objective: Vitals:   05/02/23 1505 05/02/23 2105 05/03/23 0437 05/03/23 0800  BP: (!) 157/96 (!) 145/77 (!) 151/77   Pulse: 78 95 (!) 102 100  Resp: 16 17 17 17   Temp: 98.1 F (36.7 C) 98.4 F (36.9 C) 99.5 F (37.5 C) 97.8 F (36.6 C)  TempSrc: Oral Oral  Oral Oral  SpO2: 95% 100% 98% 98%  Weight:   82 kg   Height:       No intake or output data in the 24 hours ending 05/03/23 1802 Filed Weights   05/01/23 1817 05/02/23 0500 05/03/23 0437  Weight: 74.8 kg 74.8 kg 82 kg   Examination: Physical Exam:  Constitutional: WN/WD overweight Caucasian female who appears a bit more comfortable today Respiratory: Diminished to auscultation bilaterally, no wheezing, rales, rhonchi or crackles. Normal respiratory effort and patient is not tachypenic. No accessory muscle use.  Unlabored breathing Cardiovascular: RRR, no murmurs / rubs / gallops. S1 and S2 auscultated. No extremity edema. Abdomen:  Soft, non-tender, distended secondary to body habitus. Bowel sounds positive.  GU: Deferred. Musculoskeletal: No clubbing / cyanosis of digits/nails. No joint deformity upper and lower extremities.  Skin: No rashes, lesions, ulcers on limited skin evaluation. No induration; Warm and dry.  Neurologic: CN 2-12 grossly intact with no focal deficits. Romberg sign and cerebellar reflexes not assessed.  Psychiatric: Normal judgment and insight. Alert and oriented x 3. Normal mood and appropriate affect.   Data Reviewed: I have personally reviewed following labs and imaging studies  CBC: Recent Labs  Lab 05/01/23 1819 05/02/23 0129 05/03/23 0443  WBC 28.5* 21.4* 11.9*  NEUTROABS  --  18.5* 11.3*  HGB 12.4 10.3* 11.0*  HCT 36.2 30.4* 33.0*  MCV 84.4 83.5 85.5  PLT 179 129* 128*   Basic Metabolic Panel: Recent Labs  Lab 05/01/23 1819 05/02/23 0129 05/03/23 0443  NA 128* 131* 130*  K 3.7 3.2* 4.2  CL 94* 95* 102  CO2 21* 21* 20*  GLUCOSE 216* 146* 128*  BUN 28* 29* 28*  CREATININE 2.06* 2.06* 1.99*  CALCIUM 9.1 8.1* 7.9*  MG  --  1.3* 2.3  PHOS  --   --  2.0*   GFR: Estimated Creatinine Clearance: 31.5 mL/min (A) (by C-G formula based on SCr of 1.99 mg/dL (H)). Liver Function Tests: Recent Labs  Lab 05/01/23 1819 05/02/23 0129  05/03/23 0443  AST 18 21 37  ALT 29 31 47*  ALKPHOS 66 58 88  BILITOT 1.1 0.9 0.7  PROT 7.0 5.5* 6.0*  ALBUMIN 4.0 2.7* 2.5*   No results for input(s): "LIPASE", "AMYLASE" in the last 168 hours. No results for input(s): "AMMONIA" in the last 168 hours. Coagulation Profile: Recent Labs  Lab 05/01/23 2246  INR 1.4*   Cardiac Enzymes: No results for input(s): "CKTOTAL", "CKMB", "CKMBINDEX", "TROPONINI" in the last 168 hours. BNP (last 3 results) No results for input(s): "PROBNP" in the last 8760 hours. HbA1C: No results for input(s): "HGBA1C" in the last 72 hours. CBG: Recent Labs  Lab 05/02/23 1155 05/02/23 1709 05/02/23 2109 05/03/23 0810 05/03/23 1243  GLUCAP 129* 126* 140* 118* 133*   Lipid Profile: No results for input(s): "CHOL", "HDL", "LDLCALC", "TRIG", "CHOLHDL", "LDLDIRECT" in the last 72 hours. Thyroid Function Tests: No results for input(s): "TSH", "T4TOTAL", "FREET4", "T3FREE", "THYROIDAB" in the last 72 hours. Anemia Panel: Recent Labs    05/03/23 0443  VITAMINB12 293  FOLATE 27.7  FERRITIN 413*  TIBC 199*  IRON 7*  RETICCTPCT 1.1   Sepsis Labs: Recent Labs  Lab 05/01/23 2102 05/01/23 2242 05/01/23 2246 05/01/23 2312 05/02/23 0129  PROCALCITON  --   --  24.60  --   --   LATICACIDVEN 2.1* 1.8  --  1.8 1.1   Recent Results (from the past 240 hour(s))  Urine Culture     Status: Abnormal (Preliminary result)   Collection Time: 05/01/23  8:49 PM   Specimen: Urine, Clean Catch  Result Value Ref Range Status   Specimen Description   Final    URINE, CLEAN CATCH Performed at [facility], [address] [place], [place] [address]    Special Requests   Final    NONE Performed at [facility], [address], [place], [place] [address]    Culture (A)  Final    40,000 COLONIES/mL ESCHERICHIA COLI SUSCEPTIBILITIES TO FOLLOW Performed at [facility], [address], [place], [place] [address]     Report Status PENDING  Incomplete  Blood culture (  routine x 2)     Status: None (Preliminary result)   Collection Time: 05/01/23  9:15 PM   Specimen: BLOOD  Result Value Ref Range Status   Specimen Description   Final    BLOOD RIGHT ANTECUBITAL Performed at [facility], [address], [place], [place] [address]    Special Requests   Final    BOTTLES DRAWN AEROBIC AND ANAEROBIC Blood Culture adequate volume Performed at [facility], [address] [place], [place] [address]    Culture  Setup Time   Final    GRAM NEGATIVE RODS IN BOTH AEROBIC AND ANAEROBIC BOTTLES CRITICAL RESULT CALLED TO, READ BACK BY AND VERIFIED WITH: PHARMD JIMMY WYLAND ON 05/02/23 @ 1845 BY DRT    Culture   Final    GRAM NEGATIVE RODS IDENTIFICATION TO FOLLOW Performed at [facility], [address], [place], [place] [address]    Report Status PENDING  Incomplete  Blood culture (routine x 2)     Status: Abnormal (Preliminary result)   Collection Time: 05/01/23  9:30 PM   Specimen: BLOOD  Result Value Ref Range Status   Specimen Description   Final    BLOOD LEFT ANTECUBITAL Performed at [facility], [address], [place], [place] [address]    Special Requests   Final    BOTTLES DRAWN AEROBIC AND ANAEROBIC Blood Culture adequate volume Performed at [facility], [address], [place], [place] [address]    Culture  Setup Time   Final    GRAM NEGATIVE RODS IN BOTH AEROBIC AND ANAEROBIC BOTTLES CRITICAL RESULT CALLED TO, READ BACK BY AND VERIFIED WITH: PHARMD JIMMY WYLAND ON 05/02/23 @ 1845 BY DRT    Culture (A)  Final    ESCHERICHIA COLI SUSCEPTIBILITIES TO FOLLOW Performed at [facility], [address] [place], [place] [address]    Report Status PENDING  Incomplete  Blood Culture ID Panel (Reflexed)     Status: Abnormal   Collection Time: 05/01/23  9:30 PM  Result Value Ref Range Status    Enterococcus faecalis NOT DETECTED NOT DETECTED Final   Enterococcus Faecium NOT DETECTED NOT DETECTED Final   Listeria monocytogenes NOT DETECTED NOT DETECTED Final   Staphylococcus species NOT DETECTED NOT DETECTED Final   Staphylococcus aureus (BCID) NOT DETECTED NOT DETECTED Final   Staphylococcus epidermidis NOT DETECTED NOT DETECTED Final   Staphylococcus lugdunensis NOT DETECTED NOT DETECTED Final   Streptococcus species NOT DETECTED NOT DETECTED Final   Streptococcus agalactiae NOT DETECTED NOT DETECTED Final   Streptococcus pneumoniae NOT DETECTED NOT DETECTED Final   Streptococcus pyogenes NOT DETECTED NOT DETECTED Final   A.calcoaceticus-baumannii NOT DETECTED NOT DETECTED Final   Bacteroides fragilis NOT DETECTED NOT DETECTED Final   Enterobacterales DETECTED (A) NOT DETECTED Final    Comment: Enterobacterales represent a large order of gram negative bacteria, not a single organism. CRITICAL RESULT CALLED TO, READ BACK BY AND VERIFIED WITH: PHARMD JIMMY WYLAND ON 05/02/23 @ 1845 BY DRT    Enterobacter cloacae complex NOT DETECTED NOT DETECTED Final   Escherichia coli DETECTED (A) NOT DETECTED Final    Comment: CRITICAL RESULT CALLED TO, READ BACK BY AND VERIFIED WITH: PHARMD JIMMY WYLAND ON 05/02/23 @ 1845 BY DRT    Klebsiella aerogenes NOT DETECTED NOT DETECTED Final   Klebsiella oxytoca NOT DETECTED NOT DETECTED Final   Klebsiella pneumoniae NOT DETECTED NOT DETECTED Final   Proteus species NOT DETECTED NOT DETECTED Final  Salmonella species NOT DETECTED NOT DETECTED Final   Serratia marcescens NOT DETECTED NOT DETECTED Final   Haemophilus influenzae NOT DETECTED NOT DETECTED Final   Neisseria meningitidis NOT DETECTED NOT DETECTED Final   Pseudomonas aeruginosa NOT DETECTED NOT DETECTED Final   Stenotrophomonas maltophilia NOT DETECTED NOT DETECTED Final   Candida albicans NOT DETECTED NOT DETECTED Final   Candida auris NOT DETECTED NOT DETECTED Final   Candida  glabrata NOT DETECTED NOT DETECTED Final   Candida krusei NOT DETECTED NOT DETECTED Final   Candida parapsilosis NOT DETECTED NOT DETECTED Final   Candida tropicalis NOT DETECTED NOT DETECTED Final   Cryptococcus neoformans/gattii NOT DETECTED NOT DETECTED Final   CTX-M ESBL NOT DETECTED NOT DETECTED Final   Carbapenem resistance IMP NOT DETECTED NOT DETECTED Final   Carbapenem resistance KPC NOT DETECTED NOT DETECTED Final   Carbapenem resistance NDM NOT DETECTED NOT DETECTED Final   Carbapenem resist OXA 48 LIKE NOT DETECTED NOT DETECTED Final   Carbapenem resistance VIM NOT DETECTED NOT DETECTED Final    Comment: Performed at [facility], [address] [place], [place] [address]    Radiology Studies: CT Renal Stone Study  Result Date: 05/01/2023 CLINICAL DATA:  Abdominal/flank pain, stone suspected. Right buttock pain radiating down the right leg. Fatigue and nausea. EXAM: CT ABDOMEN AND PELVIS WITHOUT CONTRAST TECHNIQUE: Multidetector CT imaging of the abdomen and pelvis was performed following the standard protocol without IV contrast. RADIATION DOSE REDUCTION: This exam was performed according to the departmental dose-optimization program which includes automated exposure control, adjustment of the mA and/or kV according to patient size and/or use of iterative reconstruction technique. COMPARISON:  CT abdomen and pelvis 05/10/2010 FINDINGS: Lower chest: No acute abnormality. Hepatobiliary: No focal liver abnormality is seen. Status post cholecystectomy. No biliary dilatation. Pancreas: Unremarkable. Spleen: Unremarkable. Adrenals/Urinary Tract: Unremarkable adrenal glands. No renal calculi or hydronephrosis. New moderate bilateral perinephric stranding. Unremarkable bladder. Stomach/Bowel: There is a small sliding hiatal hernia. There is no evidence of bowel obstruction or inflammation. The appendix is unremarkable. Vascular/Lymphatic: Mild abdominal aortic atherosclerosis without  aneurysm. No enlarged lymph nodes. Reproductive: Uterus and bilateral adnexa are unremarkable. Other: No ascites or pneumoperitoneum. Musculoskeletal: No suspicious osseous lesion. Advanced lower lumbar facet arthrosis with grade 1 anterolisthesis at L4-5. IMPRESSION: 1. Nonspecific bilateral perinephric stranding. Correlate for signs of urinary tract infection. 2. No urinary tract calculi or hydronephrosis. 3.  Aortic Atherosclerosis (ICD10-I70.0). Electronically Signed   By: Sebastian Ache M.D.   On: 05/01/2023 20:03     Scheduled Meds:  buPROPion  150 mg Oral Daily   gabapentin  100 mg Oral BID   heparin  5,000 Units Subcutaneous Q8H   insulin aspart  0-6 Units Subcutaneous TID WC   sodium chloride flush  3 mL Intravenous Q12H   Continuous Infusions:  sodium chloride     sodium chloride 75 mL/hr at 05/03/23 1042   cefTRIAXone (ROCEPHIN)  IV 2 g (05/03/23 1247)     LOS: 1 day   Marguerita Merles, DO [facility] Available via Epic secure chat 7am-7pm After these hours, please refer to coverage provider listed on [email or web address] 05/03/2023, 6:02 PM

## 2023-05-04 ENCOUNTER — Encounter: Payer: Self-pay | Admitting: Family Medicine

## 2023-05-04 DIAGNOSIS — F411 Generalized anxiety disorder: Secondary | ICD-10-CM | POA: Diagnosis not present

## 2023-05-04 DIAGNOSIS — E785 Hyperlipidemia, unspecified: Secondary | ICD-10-CM | POA: Diagnosis not present

## 2023-05-04 DIAGNOSIS — N12 Tubulo-interstitial nephritis, not specified as acute or chronic: Secondary | ICD-10-CM | POA: Diagnosis not present

## 2023-05-04 DIAGNOSIS — N179 Acute kidney failure, unspecified: Secondary | ICD-10-CM | POA: Diagnosis not present

## 2023-05-04 LAB — CBC WITH DIFFERENTIAL/PLATELET
Abs Immature Granulocytes: 0.07 K/uL (ref 0.00–0.07)
Basophils Absolute: 0 K/uL (ref 0.0–0.1)
Basophils Relative: 0 %
Eosinophils Absolute: 0.1 K/uL (ref 0.0–0.5)
Eosinophils Relative: 2 %
HCT: 29.9 % — ABNORMAL LOW (ref 36.0–46.0)
Hemoglobin: 9.9 g/dL — ABNORMAL LOW (ref 12.0–15.0)
Immature Granulocytes: 1 %
Lymphocytes Relative: 7 %
Lymphs Abs: 0.5 K/uL — ABNORMAL LOW (ref 0.7–4.0)
MCH: 28.2 pg (ref 26.0–34.0)
MCHC: 33.1 g/dL (ref 30.0–36.0)
MCV: 85.2 fL (ref 80.0–100.0)
Monocytes Absolute: 0.6 K/uL (ref 0.1–1.0)
Monocytes Relative: 9 %
Neutro Abs: 6.1 K/uL (ref 1.7–7.7)
Neutrophils Relative %: 81 %
Platelets: 125 K/uL — ABNORMAL LOW (ref 150–400)
RBC: 3.51 MIL/uL — ABNORMAL LOW (ref 3.87–5.11)
RDW: 13.4 % (ref 11.5–15.5)
WBC: 7.5 K/uL (ref 4.0–10.5)
nRBC: 0 % (ref 0.0–0.2)

## 2023-05-04 LAB — COMPREHENSIVE METABOLIC PANEL
ALT: 40 U/L (ref 0–44)
AST: 19 U/L (ref 15–41)
Albumin: 2.3 g/dL — ABNORMAL LOW (ref 3.5–5.0)
Alkaline Phosphatase: 93 U/L (ref 38–126)
Anion gap: 9 (ref 5–15)
BUN: 24 mg/dL — ABNORMAL HIGH (ref 8–23)
CO2: 21 mmol/L — ABNORMAL LOW (ref 22–32)
Calcium: 8.1 mg/dL — ABNORMAL LOW (ref 8.9–10.3)
Chloride: 105 mmol/L (ref 98–111)
Creatinine, Ser: 1.75 mg/dL — ABNORMAL HIGH (ref 0.44–1.00)
GFR, Estimated: 32 mL/min — ABNORMAL LOW (ref >60)
Glucose, Bld: 113 mg/dL — ABNORMAL HIGH (ref 70–99)
Potassium: 3.9 mmol/L (ref 3.5–5.1)
Sodium: 135 mmol/L (ref 135–145)
Total Bilirubin: 0.5 mg/dL (ref 0.3–1.2)
Total Protein: 5.5 g/dL — ABNORMAL LOW (ref 6.5–8.1)

## 2023-05-04 LAB — URINE CULTURE: Culture: 40000 — AB

## 2023-05-04 LAB — PHOSPHORUS: Phosphorus: 3 mg/dL (ref 2.5–4.6)

## 2023-05-04 LAB — GLUCOSE, CAPILLARY
Glucose-Capillary: 113 mg/dL — ABNORMAL HIGH (ref 70–99)
Glucose-Capillary: 161 mg/dL — ABNORMAL HIGH (ref 70–99)
Glucose-Capillary: 184 mg/dL — ABNORMAL HIGH (ref 70–99)
Glucose-Capillary: 124 mg/dL — ABNORMAL HIGH (ref 70–99)

## 2023-05-04 LAB — MAGNESIUM: Magnesium: 2.3 mg/dL (ref 1.7–2.4)

## 2023-05-04 LAB — CREATININE, URINE, RANDOM: Creatinine, Urine: 18 mg/dL

## 2023-05-04 LAB — OSMOLALITY, URINE: Osmolality, Ur: 87 mOsm/kg — ABNORMAL LOW (ref 300–900)

## 2023-05-04 LAB — CULTURE, BLOOD (ROUTINE X 2): Special Requests: ADEQUATE

## 2023-05-04 LAB — CK: Total CK: 53 U/L (ref 38–234)

## 2023-05-04 LAB — SODIUM, URINE, RANDOM: Sodium, Ur: <10 mmol/L

## 2023-05-04 MED ORDER — TRAMADOL HCL 50 MG PO TABS
50.0000 mg | ORAL_TABLET | Freq: Once | ORAL | Status: AC
  Administered 2023-05-04: 50 mg via ORAL
  Filled 2023-05-04: qty 1

## 2023-05-04 NOTE — Progress Notes (Signed)
PROGRESS NOTE    Konstantina Gravelle  ZOX:[id number] DOB: [birth year] DOA: 05/01/2023 PCP: Nelwyn Salisbury, MD   Brief Narrative:  Patient is a 62 year old Caucasian female with past medical history significant for but limited to hyperlipidemia, non-insulin-dependent diabetes mellitus type 2, generalized anxiety disorder as well as a comorbidities presented to [facility] complaint left-sided back and abdominal pain as well as buttock pain that radiated down her leg.  She was seen by her primary care doctor who was concerned for sciatica and initiated prednisone.  At home she developed low-grade fevers and chills and continued to have nausea and left-sided back pain and abdominal pain that radiated into her left groin.  She denies any dysuria or increased urinary frequency or hesitancy but did have abdominal pain.  In the ED she was found to have a temperature of 103 and was tachycardic with a blood pressure within normal range.  Labs revealed that she had a low bicarbonate level elevated BUN/creatinine.  UA was done and suggestive of UTI.  CT renal stone study was done and showed nonspecific bilateral perinephric stranding concerning for pyelonephritis no urinary tract calculi or evidence for hydronephrosis.  The ED she was given IV ceftriaxone and sepsis protocol was initiated and she was initiated on IV fluid hydration and antibiotics were escalated to IV cefepime but now have de-escalated back to ceftriaxone.  She has been doing slightly better today but renal function continues to be elevated. Cultures E. coli in her blood cultures and in her urine.  Currently awaiting full sensitivities we will continue antibiotic coverage.  Patient continues to complain of some left-sided buttock and flank pain that radiates down her leg.  Will obtain further MRI imaging and also apply a K-pad.  She is slowly improving but renal function continues to be elevated so nephrology recommends  increasing fluids.  Urology was consulted given that she spiked another temperature and they evaluated the patient skin intact there is nothing drainable and no abscesses and recommending continuing antibiotic course.  Blood cultures x 2 have been repeated  Assessment and Plan:  Pyelonephritis Sepsis secondary from E. coli pyelonephritis and E. coli bacteremia -Initial presentation patient found febrile, tachycardic, elevated lactic acid and lab work showed leukocytosis 28.  Evidence suggestive sepsis in the setting of pyelonephritis but further workup also reveals that she is going to E. coli bacteremia 4 out of 4 bottles. -Sepsis physiology was improving however she spiked a temperature again this afternoon so we repeated blood cultures x 2 -CBC showed evidence of leukocytosis 28.5 otherwise unremarkable and was also on Steroids PTA -UA has evidence, moderate hemoglobin, protein 100, moderate leukocyte esterase and many bacteria evidence of UTI.  Urine culture done and showing 40,000 colony forming units of E. coli with sensitivities pending and blood cultures done x 2 showing 4 out of 4 bottles of E. coli -Initial presentation lactic acid 2.1. -CT Renal Stone was ordered which showed nonspecific bilateral perinephric stranding concerning for pyelonephritis, no urinary tract calculi or evidence of hydronephrosis.   -In the Emergency department patient received ceftriaxone.  With sepsis protocoL patient being started on high flow IV fluid NS 150 cc/hr and we have now weaned this to 75 MLS per hour -Continue sepsis protocol -Continued broad-spectrum antibiotics cefepime per pharmacy protocol and this has now been transitioned to IV ceftriaxone -WBC and Lactic Acid Level Trend: Recent Labs  Lab 05/01/23 1819 05/01/23 2102 05/02/23 0129 05/03/23 0443 05/04/23 0335  WBC 28.5*  --  21.4* 11.9* 7.5  LATICACIDVEN  --    < > 1.1  --   --    < > = values in this interval not displayed.  -Follow-up  with blood and urine culture result for appropriate antibiotic guidance as sensitivities are still pending -This was discussed with urology who feels that there is no abscesses to drain and recommends continuing current course for now -CBC, CMP, mag, Phos in a.m.   Hyponatremia -CMP showed corrected sodium is 130 with high blood glucose 230. -Acute onset of hyponatremia in the setting of AKI and likely Hypovolemic  -If morning a.m. sodium has been corrected more than 4 to 6 meq will change IV fluid to LR. -Na+ Trend: Recent Labs  Lab 05/01/23 1819 05/02/23 0129 05/03/23 0443 05/04/23 0335  NA 128* 131* 130* 135  -Given that development of acute hyponatremia the setting of acute kidney injury not checking urinary osmolarity, urine sodium and serum osmolarity at this time.   Prerenal AKI -CMP showed elevated creatinine 2.06. -Normal renal function at baseline -BUN/Cr Trend: Recent Labs  Lab 05/01/23 1819 05/02/23 0129 05/03/23 0443 05/04/23 0335  BUN 28* 29* 28* 24*  CREATININE 2.06* 2.06* 1.99* 1.75*  -Acute kidney injury in the setting of sepsis tachycardia decreasing effective blood flow to kidney and hypoperfusion.  Not concerning for intrinsic renal and postrenal obstruction but will check bladder scan to evaluate and if necessary a renal ultrasound -CT abdomen pelvis showed evidence of pyelonephritis.  No concern for hydronephrosis and urinary tract calculi. -Continue IV fluid but increase Normal Saline 75 mL/hr to 100 mL/h per nephrology recommendations -Urine osmolality, urine sodium, urine creatinine to be ordered and may need nephrology involvement given her minimal improvement -Avoid Nephrotoxic Medications, Contrast Dyes, Hypotension and Dehydration to Ensure Adequate Renal Perfusion and will need to Renally Adjust Meds -Continue to Monitor and Trend Renal Function carefully and repeat CMP in the AM  -Discussed with nephrology about the patient's case and they  recommended increasing her fluids and checking CK level recommended continuing the course for now  Hypokalemia -Patient's K+ Level Trend: Recent Labs  Lab 05/01/23 1819 05/02/23 0129 05/03/23 0443 05/04/23 0335  K 3.7 3.2* 4.2 3.9  -Continue to Monitor and Replete as Necessary -Repeat CMP in the AM   Hypomagnesemia -Patient's Mag Level Trend: Recent Labs  Lab 05/02/23 0129 05/03/23 0443 05/04/23 0335  MG 1.3* 2.3 2.3  -Continue to Monitor and Replete as Necessary -Repeat Mag in the AM   Non anion gap metabolic acidosis -Improving but still low as CO2 is 21, anion gap is 9, chloride level 105    Hyperlipidemia -Continue to Hold Statin for now   General Anxiety Disorder -C/w Resumed home bupropion 150 mg daily    Diabetes mellitus type 2-non-insulin-dependent -Most recent HbA1c is 6. -Patient reported taking Mounjaro 12.5 mg once a week. -Continue Diabetic Diet, sliding scale SSI and check POC 3 times daily with meal and bedtime. -Continue to Monitor and CBG and Glucose Trend: Recent Labs  Lab 05/01/23 1819 05/02/23 0129 05/02/23 0906 05/03/23 0443 05/03/23 0810 05/04/23 0335 05/04/23 0754 05/04/23 1719  GLUCOSE 216* 146*  --  128*  --  113*  --   --   GLUCAP  --   --    < >  --    < >  --    < > 184*   < > = values in this interval not displayed.   Normocytic Anemia -Likely Hemoconcentrated on Admission and  had a Dilutional drop -Hgb/Hct Trend: Recent Labs  Lab 05/01/23 1819 05/02/23 0129 05/03/23 0443 05/04/23 0335  HGB 12.4 10.3* 11.0* 9.9*  HCT 36.2 30.4* 33.0* 29.9*  MCV 84.4 83.5 85.5 85.2  -Check Anemia Panel showed an iron level of 7, UIBC of 192, TIBC 199, saturation ratios of 4%, ferritin level 413, folate level of 27.7 and a vitamin B12 of 293 -Continue to Monitor for S/Sx of Bleeding; No overt bleeding noted -Repeat CBC in the AM  Thrombocytopenia -In the Setting of Sepsis -Patient's Plt Count Trend: Recent Labs  Lab 05/01/23 1819  05/02/23 0129 05/03/23 0443 05/04/23 0335  PLT 179 129* 128* 125*  -Continue to Monitor and Trend and Repeat CBC in the AM  Left Buttock pain with radiation down her leg -There is concern for sciatica -Initiate gabapentin 100 mg twice daily -CT Scan done and showed "Nonspecific bilateral perinephric stranding. Correlate for signs of urinary tract infection. No urinary tract calculi or hydronephrosis. Aortic Atherosclerosis" -Given her persistence we will obtain a lumbar MRI as well as a sacrum MRI for further evaluation given her infection -MRI done and showed "Diffuse intramuscular edema involving the lower posterior paraspinous musculature, left greater than right. Findings are nonspecific, but could reflect changes of acute myositis, which could be either infectious or inflammatory in nature. Changes of muscular injury/strain would be the primary differential consideration. No loculated collections. No other evidence for acute infection within the lumbar spine or sacrum. 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 Additional moderately advanced lower lumbar facet hypertrophy, which could contribute to lower back pain. Moderate volume free fluid within the pelvis, nonspecific.  Fibroid uterus." -Ordered K-pad  Hypoalbuminemia -Patient's Albumin Trend: Recent Labs  Lab 05/01/23 1819 05/02/23 0129 05/03/23 0443 05/04/23 0335  ALBUMIN 4.0 2.7* 2.5* 2.3*  -Continue to Monitor and Trend and repeat CMP in the AM   DVT prophylaxis: heparin injection 5,000 Units Start: 05/03/23 1615 SCDs Start: 05/01/23 2227    Code Status: Full Code Family Communication: No family currently at bedside  Disposition Plan:  Level of care: Med-Surg Status is: Inpatient Remains inpatient appropriate because: His further clinical improvement given that she still spiking temperatures   Consultants:  Case  discussed with nephrology Case was discussed with urology  Procedures:  As delineated as above  Antimicrobials:  Anti-infectives (From admission, onward)    Start     Dose/Rate Route Frequency Ordered Stop   05/03/23 1200  cefTRIAXone (ROCEPHIN) 2 g in sodium chloride 0.9 % 100 mL IVPB        2 g 200 mL/hr over 30 Minutes Intravenous Every 24 hours 05/03/23 1106 05/08/23 1159   05/01/23 2315  ceFEPIme (MAXIPIME) 2 g in sodium chloride 0.9 % 100 mL IVPB  Status:  Discontinued        2 g 200 mL/hr over 30 Minutes Intravenous Daily at bedtime 05/01/23 2228 05/03/23 1106   05/01/23 2100  cefTRIAXone (ROCEPHIN) 2 g in sodium chloride 0.9 % 100 mL IVPB        2 g 200 mL/hr over 30 Minutes Intravenous  Once 05/01/23 2049 05/01/23 2148   05/01/23 2030  cefTRIAXone (ROCEPHIN) 1 g in sodium chloride 0.9 % 100 mL IVPB  Status:  Discontinued        1 g 200 mL/hr over 30 Minutes Intravenous  Once 05/01/23 2019 05/01/23 2049       Subjective: Seen and examined at  bedside and still is very uncomfortable on the left side.  Having pain intermittently.  States that she is feeling better overall however suspected that her temperature this afternoon of 101.  No nausea or vomiting.  Denies any other concerns or complaints at this time.  Objective: Vitals:   05/04/23 0500 05/04/23 0638 05/04/23 0724 05/04/23 1550  BP:  120/67 124/66 131/89  Pulse:  90 80 89  Resp:  18 16 16   Temp:  98.7 F (37.1 C) 98.7 F (37.1 C) (!) 101 F (38.3 C)  TempSrc:  Oral Oral Oral  SpO2:  95% 97% 97%  Weight: 80.9 kg 78.9 kg    Height:        Intake/Output Summary (Last 24 hours) at 05/04/2023 1842 Last data filed at 05/04/2023 0600 Gross per 24 hour  Intake 1715.82 ml  Output 900 ml  Net 815.82 ml   Filed Weights   05/03/23 0437 05/04/23 0500 05/04/23 0638  Weight: 82 kg 80.9 kg 78.9 kg   Examination: Physical Exam:  Constitutional: WN/WD overweight Caucasian female in no acute distress Respiratory:  Diminished to auscultation bilaterally, no wheezing, rales, rhonchi or crackles. Normal respiratory effort and patient is not tachypenic. No accessory muscle use. Unlabored breathing  Cardiovascular: RRR, no murmurs / rubs / gallops. S1 and S2 auscultated. No extremity edema. Abdomen: Soft, non-tender, Distended 2/2 body habitus. Bowel sounds positive.  GU: Deferred. Musculoskeletal: No clubbing / cyanosis of digits/nails. No joint deformity upper and lower extremities.  Skin: No rashes, lesions, ulcers on a limited skin evaluation. No induration; Warm and dry.  Neurologic: CN 2-12 grossly intact with no focal deficits. Romberg sign and cerebellar reflexes not assessed.  Psychiatric: Normal judgment and insight. Alert and oriented x 3. Normal mood and appropriate affect.   Data Reviewed: I have personally reviewed following labs and imaging studies  CBC: Recent Labs  Lab 05/01/23 1819 05/02/23 0129 05/03/23 0443 05/04/23 0335  WBC 28.5* 21.4* 11.9* 7.5  NEUTROABS  --  18.5* 11.3* 6.1  HGB 12.4 10.3* 11.0* 9.9*  HCT 36.2 30.4* 33.0* 29.9*  MCV 84.4 83.5 85.5 85.2  PLT 179 129* 128* 125*   Basic Metabolic Panel: Recent Labs  Lab 05/01/23 1819 05/02/23 0129 05/03/23 0443 05/04/23 0335  NA 128* 131* 130* 135  K 3.7 3.2* 4.2 3.9  CL 94* 95* 102 105  CO2 21* 21* 20* 21*  GLUCOSE 216* 146* 128* 113*  BUN 28* 29* 28* 24*  CREATININE 2.06* 2.06* 1.99* 1.75*  CALCIUM 9.1 8.1* 7.9* 8.1*  MG  --  1.3* 2.3 2.3  PHOS  --   --  2.0* 3.0   GFR: Estimated Creatinine Clearance: 35.1 mL/min (A) (by C-G formula based on SCr of 1.75 mg/dL (H)). Liver Function Tests: Recent Labs  Lab 05/01/23 1819 05/02/23 0129 05/03/23 0443 05/04/23 0335  AST 18 21 37 19  ALT 29 31 47* 40  ALKPHOS 66 58 88 93  BILITOT 1.1 0.9 0.7 0.5  PROT 7.0 5.5* 6.0* 5.5*  ALBUMIN 4.0 2.7* 2.5* 2.3*   No results for input(s): "LIPASE", "AMYLASE" in the last 168 hours. No results for input(s): "AMMONIA" in  the last 168 hours. Coagulation Profile: Recent Labs  Lab 05/01/23 2246  INR 1.4*   Cardiac Enzymes: Recent Labs  Lab 05/04/23 0335  CKTOTAL 53   BNP (last 3 results) No results for input(s): "PROBNP" in the last 8760 hours. HbA1C: No results for input(s): "HGBA1C" in the last 72 hours. CBG: Recent  Labs  Lab 05/03/23 1243 05/03/23 2127 05/04/23 0754 05/04/23 1156 05/04/23 1719  GLUCAP 133* 156* 113* 161* 184*   Lipid Profile: No results for input(s): "CHOL", "HDL", "LDLCALC", "TRIG", "CHOLHDL", "LDLDIRECT" in the last 72 hours. Thyroid Function Tests: No results for input(s): "TSH", "T4TOTAL", "FREET4", "T3FREE", "THYROIDAB" in the last 72 hours. Anemia Panel: Recent Labs    05/03/23 0443  VITAMINB12 293  FOLATE 27.7  FERRITIN 413*  TIBC 199*  IRON 7*  RETICCTPCT 1.1   Sepsis Labs: Recent Labs  Lab 05/01/23 2102 05/01/23 2242 05/01/23 2246 05/01/23 2312 05/02/23 0129  PROCALCITON  --   --  24.60  --   --   LATICACIDVEN 2.1* 1.8  --  1.8 1.1   Recent Results (from the past 240 hour(s))  Urine Culture     Status: Abnormal   Collection Time: 05/01/23  8:49 PM   Specimen: Urine, Clean Catch  Result Value Ref Range Status   Specimen Description   Final    URINE, CLEAN CATCH Performed at [facility], [address], [place], [place] [address]    Special Requests   Final    NONE Performed at [facility], [address], [place], [place] [address]    Culture 40,000 COLONIES/mL ESCHERICHIA COLI (A)  Final   Report Status 05/04/2023 FINAL  Final   Organism ID, Bacteria ESCHERICHIA COLI (A)  Final      Susceptibility   Escherichia coli - MIC*    AMPICILLIN >=32 RESISTANT Resistant     CEFAZOLIN <=4 SENSITIVE Sensitive     CEFEPIME <=0.12 SENSITIVE Sensitive     CEFTRIAXONE <=0.25 SENSITIVE Sensitive     CIPROFLOXACIN <=0.25 SENSITIVE Sensitive     GENTAMICIN <=1 SENSITIVE Sensitive     IMIPENEM <=0.25  SENSITIVE Sensitive     NITROFURANTOIN <=16 SENSITIVE Sensitive     TRIMETH/SULFA >=320 RESISTANT Resistant     AMPICILLIN/SULBACTAM 4 SENSITIVE Sensitive     PIP/TAZO <=4 SENSITIVE Sensitive     * 40,000 COLONIES/mL ESCHERICHIA COLI  Blood culture (routine x 2)     Status: Abnormal   Collection Time: 05/01/23  9:15 PM   Specimen: BLOOD  Result Value Ref Range Status   Specimen Description BLOOD RIGHT ANTECUBITAL  Final   Special Requests   Final    BOTTLES DRAWN AEROBIC AND ANAEROBIC Blood Culture adequate volume   Culture  Setup Time   Final    GRAM NEGATIVE RODS IN BOTH AEROBIC AND ANAEROBIC BOTTLES CRITICAL RESULT CALLED TO, READ BACK BY AND VERIFIED WITH: PHARMD JIMMY WYLAND ON 05/02/23 @ 1845 BY DRT    Culture (A)  Final    ESCHERICHIA COLI SUSCEPTIBILITIES PERFORMED ON PREVIOUS CULTURE WITHIN THE LAST 5 DAYS.    Report Status 05/04/2023 FINAL  Final  Blood culture (routine x 2)     Status: Abnormal   Collection Time: 05/01/23  9:30 PM   Specimen: BLOOD  Result Value Ref Range Status   Specimen Description   Final    BLOOD LEFT ANTECUBITAL Performed at [facility], [address] [place], [place] [address]    Special Requests   Final    BOTTLES DRAWN AEROBIC AND ANAEROBIC Blood Culture adequate volume Performed at [facility], [address] [place], [place] [address]    Culture  Setup Time   Final    GRAM NEGATIVE RODS IN BOTH AEROBIC AND ANAEROBIC BOTTLES CRITICAL RESULT CALLED TO, READ BACK BY AND VERIFIED WITH: PHARMD JIMMY  [facility] ON 05/02/23 @ 1845 BY DRT Performed at [facility], [address], [place], [place] [address]    Culture ESCHERICHIA COLI (A)  Final   Report Status 05/04/2023 FINAL  Final   Organism ID, Bacteria ESCHERICHIA COLI  Final      Susceptibility   Escherichia coli - MIC*    AMPICILLIN >=32 RESISTANT Resistant     CEFEPIME <=0.12 SENSITIVE Sensitive     CEFTAZIDIME <=1 SENSITIVE Sensitive      CEFTRIAXONE <=0.25 SENSITIVE Sensitive     CIPROFLOXACIN <=0.25 SENSITIVE Sensitive     GENTAMICIN <=1 SENSITIVE Sensitive     IMIPENEM <=0.25 SENSITIVE Sensitive     TRIMETH/SULFA >=320 RESISTANT Resistant     AMPICILLIN/SULBACTAM 8 SENSITIVE Sensitive     PIP/TAZO <=4 SENSITIVE Sensitive     * ESCHERICHIA COLI  Blood Culture ID Panel (Reflexed)     Status: Abnormal   Collection Time: 05/01/23  9:30 PM  Result Value Ref Range Status   Enterococcus faecalis NOT DETECTED NOT DETECTED Final   Enterococcus Faecium NOT DETECTED NOT DETECTED Final   Listeria monocytogenes NOT DETECTED NOT DETECTED Final   Staphylococcus species NOT DETECTED NOT DETECTED Final   Staphylococcus aureus (BCID) NOT DETECTED NOT DETECTED Final   Staphylococcus epidermidis NOT DETECTED NOT DETECTED Final   Staphylococcus lugdunensis NOT DETECTED NOT DETECTED Final   Streptococcus species NOT DETECTED NOT DETECTED Final   Streptococcus agalactiae NOT DETECTED NOT DETECTED Final   Streptococcus pneumoniae NOT DETECTED NOT DETECTED Final   Streptococcus pyogenes NOT DETECTED NOT DETECTED Final   A.calcoaceticus-baumannii NOT DETECTED NOT DETECTED Final   Bacteroides fragilis NOT DETECTED NOT DETECTED Final   Enterobacterales DETECTED (A) NOT DETECTED Final    Comment: Enterobacterales represent a large order of gram negative bacteria, not a single organism. CRITICAL RESULT CALLED TO, READ BACK BY AND VERIFIED WITH: PHARMD JIMMY WYLAND ON 05/02/23 @ 1845 BY DRT    Enterobacter cloacae complex NOT DETECTED NOT DETECTED Final   Escherichia coli DETECTED (A) NOT DETECTED Final    Comment: CRITICAL RESULT CALLED TO, READ BACK BY AND VERIFIED WITH: PHARMD JIMMY WYLAND ON 05/02/23 @ 1845 BY DRT    Klebsiella aerogenes NOT DETECTED NOT DETECTED Final   Klebsiella oxytoca NOT DETECTED NOT DETECTED Final   Klebsiella pneumoniae NOT DETECTED NOT DETECTED Final   Proteus species NOT DETECTED NOT DETECTED Final    Salmonella species NOT DETECTED NOT DETECTED Final   Serratia marcescens NOT DETECTED NOT DETECTED Final   Haemophilus influenzae NOT DETECTED NOT DETECTED Final   Neisseria meningitidis NOT DETECTED NOT DETECTED Final   Pseudomonas aeruginosa NOT DETECTED NOT DETECTED Final   Stenotrophomonas maltophilia NOT DETECTED NOT DETECTED Final   Candida albicans NOT DETECTED NOT DETECTED Final   Candida auris NOT DETECTED NOT DETECTED Final   Candida glabrata NOT DETECTED NOT DETECTED Final   Candida krusei NOT DETECTED NOT DETECTED Final   Candida parapsilosis NOT DETECTED NOT DETECTED Final   Candida tropicalis NOT DETECTED NOT DETECTED Final   Cryptococcus neoformans/gattii NOT DETECTED NOT DETECTED Final   CTX-M ESBL NOT DETECTED NOT DETECTED Final   Carbapenem resistance IMP NOT DETECTED NOT DETECTED Final   Carbapenem resistance KPC NOT DETECTED NOT DETECTED Final   Carbapenem resistance NDM NOT DETECTED NOT DETECTED Final   Carbapenem resist OXA 48 LIKE NOT DETECTED NOT DETECTED Final   Carbapenem resistance VIM NOT DETECTED NOT DETECTED Final    Comment: Performed at [facility], [address], [place], [place] [address]    Radiology Studies: MR SACRUM SI JOINTS WO CONTRAST  Result Date: 05/03/2023 CLINICAL DATA:  Initial evaluation for lumbar radiculopathy, low back pain, infection suspected. EXAM: MRI LUMBAR SPINE WITHOUT CONTRAST MRI SACRUM WITHOUT CONTRAST TECHNIQUE: Multiplanar, multisequence MR imaging of the lumbar spine was performed. No intravenous contrast was administered. COMPARISON:  CT from 05/01/2023. FINDINGS: MRI LUMBAR SPINE: Segmentation: Standard. Lowest well-formed disc space labeled the L5-S1 level. Alignment: 4 mm anterolisthesis of L4 on L5, with trace 2 mm anterolisthesis of L5 on S1. Findings chronic and facet mediated. Alignment otherwise normal preservation of the normal lumbar lordosis. Vertebrae: Vertebral body height maintained without acute or chronic  fracture. Bone marrow signal intensity within normal limits. Few scattered subcentimeter benign hemangiomata noted. No worrisome osseous lesions. No evidence for osteomyelitis discitis or septic arthritis. Conus medullaris and cauda equina: Conus extends to the T12-L1 level. Conus and cauda equina appear normal. Paraspinal and other soft tissues: Mild diffuse edema present within the lower posterior paraspinous musculature, extending inferiorly towards the sacrum. Overall, changes are slightly worse on the left (series 6, images 12, 2). Mild edema within the overlying subcutaneous fat. Findings are nonspecific. No loculated soft tissue collections. Mild bilateral perinephric stranding again noted. Disc levels: L1-2: N disc desiccation without significant disc bulge. Mild facet hypertrophy. No stenosis. L2-3: Disc desiccation with minimal left eccentric disc bulge. Mild to moderate facet hypertrophy. No spinal stenosis. Foramina remain patent. L3-4: Disc desiccation with minimal annular disc bulge. Moderate facet hypertrophy. Superimposed 4 mm synovial cyst at the anterior margin of the right L3-4 facet. Resultant mild spinal stenosis. Mild bilateral L3 foraminal narrowing. L4-5: 4 mm anterolisthesis. Disc desiccation with mild disc bulge. Left extraforaminal annular fissure noted. Severe bilateral facet arthrosis. Tiny probable 5 mm synovial cyst noted at the anterior margin of the right L4-5 facet at the level of the neural foramen (series 9, image 29). Resultant moderate spinal stenosis. Mild to moderate bilateral L4 foraminal narrowing. L5-S1: Trace anterolisthesis. Disc desiccation with mild disc bulge. Mild reactive endplate spurring. Mild to moderate facet hypertrophy. Resultant mild narrowing of the right lateral recess. Central canal remains patent. No significant foraminal stenosis. MRI SACRUM: Bone marrow signal intensity within normal limits. Few scattered subcentimeter benign hemangiomata noted. No  worrisome osseous lesions. Sacroiliac joints demonstrate a normal and symmetric appearance. No juxta-articular edema or erosive changes to suggest sacroiliitis. No findings of septic arthritis. Diffuse soft tissue edema present within the left greater than right lower posterior paraspinous musculature (series 6, image 5). Mild edema within the gluteal musculature noted as well. Diffuse edema noted within the overlying subcutaneous fat. No loculated collections. Moderate volume free fluid noted within the pelvis, nonspecific. 1.3 cm subserosal fibroid present at the anterior uterine body. Small nabothian cyst noted at the cervix. Remainder of the visualized pelvic viscera demonstrate no acute or significant finding. Major vascular flow voids are maintained. No pathologically enlarged lymph nodes. IMPRESSION: 1. Diffuse intramuscular edema involving the lower posterior paraspinous musculature, left greater than right. Findings are nonspecific, but could reflect changes of acute myositis, which could be either infectious or inflammatory in nature. Changes of muscular injury/strain would be the primary differential consideration. No loculated collections. 2. No other evidence for acute infection within the lumbar spine or sacrum. 3. 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 4. Additional moderately advanced lower lumbar facet hypertrophy, which could contribute to  lower back pain. 5. Moderate volume free fluid within the pelvis, nonspecific. 6. Fibroid uterus. Electronically Signed   By: Rise Mu M.D.   On: 05/03/2023 20:18   MR LUMBAR SPINE WO CONTRAST  Result Date: 05/03/2023 CLINICAL DATA:  Initial evaluation for lumbar radiculopathy, low back pain, infection suspected. EXAM: MRI LUMBAR SPINE WITHOUT CONTRAST MRI SACRUM WITHOUT CONTRAST TECHNIQUE: Multiplanar, multisequence MR imaging of the lumbar spine  was performed. No intravenous contrast was administered. COMPARISON:  CT from 05/01/2023. FINDINGS: MRI LUMBAR SPINE: Segmentation: Standard. Lowest well-formed disc space labeled the L5-S1 level. Alignment: 4 mm anterolisthesis of L4 on L5, with trace 2 mm anterolisthesis of L5 on S1. Findings chronic and facet mediated. Alignment otherwise normal preservation of the normal lumbar lordosis. Vertebrae: Vertebral body height maintained without acute or chronic fracture. Bone marrow signal intensity within normal limits. Few scattered subcentimeter benign hemangiomata noted. No worrisome osseous lesions. No evidence for osteomyelitis discitis or septic arthritis. Conus medullaris and cauda equina: Conus extends to the T12-L1 level. Conus and cauda equina appear normal. Paraspinal and other soft tissues: Mild diffuse edema present within the lower posterior paraspinous musculature, extending inferiorly towards the sacrum. Overall, changes are slightly worse on the left (series 6, images 12, 2). Mild edema within the overlying subcutaneous fat. Findings are nonspecific. No loculated soft tissue collections. Mild bilateral perinephric stranding again noted. Disc levels: L1-2: N disc desiccation without significant disc bulge. Mild facet hypertrophy. No stenosis. L2-3: Disc desiccation with minimal left eccentric disc bulge. Mild to moderate facet hypertrophy. No spinal stenosis. Foramina remain patent. L3-4: Disc desiccation with minimal annular disc bulge. Moderate facet hypertrophy. Superimposed 4 mm synovial cyst at the anterior margin of the right L3-4 facet. Resultant mild spinal stenosis. Mild bilateral L3 foraminal narrowing. L4-5: 4 mm anterolisthesis. Disc desiccation with mild disc bulge. Left extraforaminal annular fissure noted. Severe bilateral facet arthrosis. Tiny probable 5 mm synovial cyst noted at the anterior margin of the right L4-5 facet at the level of the neural foramen (series 9, image 29).  Resultant moderate spinal stenosis. Mild to moderate bilateral L4 foraminal narrowing. L5-S1: Trace anterolisthesis. Disc desiccation with mild disc bulge. Mild reactive endplate spurring. Mild to moderate facet hypertrophy. Resultant mild narrowing of the right lateral recess. Central canal remains patent. No significant foraminal stenosis. MRI SACRUM: Bone marrow signal intensity within normal limits. Few scattered subcentimeter benign hemangiomata noted. No worrisome osseous lesions. Sacroiliac joints demonstrate a normal and symmetric appearance. No juxta-articular edema or erosive changes to suggest sacroiliitis. No findings of septic arthritis. Diffuse soft tissue edema present within the left greater than right lower posterior paraspinous musculature (series 6, image 5). Mild edema within the gluteal musculature noted as well. Diffuse edema noted within the overlying subcutaneous fat. No loculated collections. Moderate volume free fluid noted within the pelvis, nonspecific. 1.3 cm subserosal fibroid present at the anterior uterine body. Small nabothian cyst noted at the cervix. Remainder of the visualized pelvic viscera demonstrate no acute or significant finding. Major vascular flow voids are maintained. No pathologically enlarged lymph nodes. IMPRESSION: 1. Diffuse intramuscular edema involving the lower posterior paraspinous musculature, left greater than right. Findings are nonspecific, but could reflect changes of acute myositis, which could be either infectious or inflammatory in nature. Changes of muscular injury/strain would be the primary differential consideration. No loculated collections. 2. No other evidence for acute infection within the lumbar spine or sacrum. 3. 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 4. Additional moderately advanced lower lumbar facet hypertrophy, which could contribute to  lower back pain. 5. Moderate volume free fluid within the pelvis, nonspecific. 6. Fibroid uterus. Electronically Signed   By: Rise Mu M.D.   On: 05/03/2023 20:18    Scheduled Meds:  buPROPion  150 mg Oral Daily   gabapentin  100 mg Oral BID   heparin  5,000 Units Subcutaneous Q8H   insulin aspart  0-6 Units Subcutaneous TID WC   sodium chloride flush  3 mL Intravenous Q12H   Continuous Infusions:  sodium chloride     sodium chloride 100 mL/hr at 05/04/23 1054   cefTRIAXone (ROCEPHIN)  IV 2 g (05/04/23 1134)    LOS: 2 days   Marguerita Merles, DO [facility] Available via Epic secure chat 7am-7pm After these hours, please refer to coverage provider listed on [email or web address] 05/04/2023, 6:42 PM

## 2023-05-05 DIAGNOSIS — N179 Acute kidney failure, unspecified: Secondary | ICD-10-CM | POA: Diagnosis not present

## 2023-05-05 DIAGNOSIS — F411 Generalized anxiety disorder: Secondary | ICD-10-CM | POA: Diagnosis not present

## 2023-05-05 DIAGNOSIS — N12 Tubulo-interstitial nephritis, not specified as acute or chronic: Secondary | ICD-10-CM | POA: Diagnosis not present

## 2023-05-05 DIAGNOSIS — E785 Hyperlipidemia, unspecified: Secondary | ICD-10-CM | POA: Diagnosis not present

## 2023-05-05 LAB — CBC WITH DIFFERENTIAL/PLATELET
Abs Immature Granulocytes: 0.27 10*3/uL — ABNORMAL HIGH (ref 0.00–0.07)
Basophils Absolute: 0 10*3/uL (ref 0.0–0.1)
Basophils Relative: 0 %
Eosinophils Absolute: 0.2 10*3/uL (ref 0.0–0.5)
Eosinophils Relative: 2 %
HCT: 26.1 % — ABNORMAL LOW (ref 36.0–46.0)
Hemoglobin: 8.7 g/dL — ABNORMAL LOW (ref 12.0–15.0)
Immature Granulocytes: 3 %
Lymphocytes Relative: 7 %
Lymphs Abs: 0.7 10*3/uL (ref 0.7–4.0)
MCH: 28.9 pg (ref 26.0–34.0)
MCHC: 33.3 g/dL (ref 30.0–36.0)
MCV: 86.7 fL (ref 80.0–100.0)
Monocytes Absolute: 1.1 10*3/uL — ABNORMAL HIGH (ref 0.1–1.0)
Monocytes Relative: 11 %
Neutro Abs: 7.3 10*3/uL (ref 1.7–7.7)
Neutrophils Relative %: 77 %
Platelets: 135 10*3/uL — ABNORMAL LOW (ref 150–400)
RBC: 3.01 MIL/uL — ABNORMAL LOW (ref 3.87–5.11)
RDW: 13.8 % (ref 11.5–15.5)
WBC: 9.6 10*3/uL (ref 4.0–10.5)
nRBC: 0 % (ref 0.0–0.2)

## 2023-05-05 LAB — COMPREHENSIVE METABOLIC PANEL
ALT: 76 U/L — ABNORMAL HIGH (ref 0–44)
AST: 58 U/L — ABNORMAL HIGH (ref 15–41)
Albumin: 2.2 g/dL — ABNORMAL LOW (ref 3.5–5.0)
Alkaline Phosphatase: 129 U/L — ABNORMAL HIGH (ref 38–126)
Anion gap: 11 (ref 5–15)
BUN: 19 mg/dL (ref 8–23)
CO2: 19 mmol/L — ABNORMAL LOW (ref 22–32)
Calcium: 8 mg/dL — ABNORMAL LOW (ref 8.9–10.3)
Chloride: 105 mmol/L (ref 98–111)
Creatinine, Ser: 1.49 mg/dL — ABNORMAL HIGH (ref 0.44–1.00)
GFR, Estimated: 39 mL/min — ABNORMAL LOW (ref >60)
Glucose, Bld: 125 mg/dL — ABNORMAL HIGH (ref 70–99)
Potassium: 3.7 mmol/L (ref 3.5–5.1)
Sodium: 135 mmol/L (ref 135–145)
Total Bilirubin: 0.3 mg/dL (ref 0.3–1.2)
Total Protein: 5.1 g/dL — ABNORMAL LOW (ref 6.5–8.1)

## 2023-05-05 LAB — MAGNESIUM: Magnesium: 2 mg/dL (ref 1.7–2.4)

## 2023-05-05 LAB — URINE CULTURE: Culture: NO GROWTH

## 2023-05-05 LAB — GLUCOSE, CAPILLARY
Glucose-Capillary: 103 mg/dL — ABNORMAL HIGH (ref 70–99)
Glucose-Capillary: 171 mg/dL — ABNORMAL HIGH (ref 70–99)
Glucose-Capillary: 114 mg/dL — ABNORMAL HIGH (ref 70–99)
Glucose-Capillary: 131 mg/dL — ABNORMAL HIGH (ref 70–99)

## 2023-05-05 LAB — CULTURE, BLOOD (ROUTINE X 2)

## 2023-05-05 LAB — PHOSPHORUS: Phosphorus: 2.8 mg/dL (ref 2.5–4.6)

## 2023-05-05 MED ORDER — GABAPENTIN 100 MG PO CAPS
200.0000 mg | ORAL_CAPSULE | Freq: Two times a day (BID) | ORAL | Status: DC
  Administered 2023-05-05 – 2023-05-06 (×2): 200 mg via ORAL
  Filled 2023-05-05 (×2): qty 2

## 2023-05-05 MED ORDER — LIDOCAINE 5 % EX PTCH
1.0000 | MEDICATED_PATCH | CUTANEOUS | Status: DC
  Administered 2023-05-05: 1 via TRANSDERMAL
  Filled 2023-05-05: qty 1

## 2023-05-05 MED ORDER — SODIUM BICARBONATE 650 MG PO TABS
650.0000 mg | ORAL_TABLET | Freq: Two times a day (BID) | ORAL | Status: DC
  Administered 2023-05-05 – 2023-05-06 (×3): 650 mg via ORAL
  Filled 2023-05-05 (×3): qty 1

## 2023-05-05 MED ORDER — DICLOFENAC SODIUM 1 % EX GEL
2.0000 g | Freq: Four times a day (QID) | CUTANEOUS | Status: DC
  Administered 2023-05-05 – 2023-05-06 (×4): 2 g via TOPICAL
  Filled 2023-05-05: qty 100

## 2023-05-05 NOTE — Progress Notes (Signed)
PROGRESS NOTE    Tracy Anderson  ZSW:[id number] DOB: January 26, 1961 DOA: 05/01/2023 PCP: Nelwyn Salisbury, MD   Brief Narrative:  Patient is a 62 year old Caucasian female with past medical history significant for but limited to hyperlipidemia, non-insulin-dependent diabetes mellitus type 2, generalized anxiety disorder as well as a comorbidities presented to [facility] complaint left-sided back and abdominal pain as well as buttock pain that radiated down her leg.  She was seen by her primary care doctor who was concerned for sciatica and initiated prednisone.  At home she developed low-grade fevers and chills and continued to have nausea and left-sided back pain and abdominal pain that radiated into her left groin.  She denies any dysuria or increased urinary frequency or hesitancy but did have abdominal pain.  In the ED she was found to have a temperature of 103 and was tachycardic with a blood pressure within normal range.  Labs revealed that she had a low bicarbonate level elevated BUN/creatinine.  UA was done and suggestive of UTI.  CT renal stone study was done and showed nonspecific bilateral perinephric stranding concerning for pyelonephritis no urinary tract calculi or evidence for hydronephrosis.  The ED she was given IV ceftriaxone and sepsis protocol was initiated and she was initiated on IV fluid hydration and antibiotics were escalated to IV cefepime but now have de-escalated back to ceftriaxone.  She has been doing slightly better today but renal function continues to be elevated. Cultures E. coli in her blood cultures and in her urine.  Currently awaiting full sensitivities we will continue antibiotic coverage.  Patient continues to complain of some left-sided buttock and flank pain that radiates down her leg.  Will obtain further MRI imaging and also apply a K-pad.  She is slowly improving but renal function continues to be elevated so nephrology recommends  increasing fluids.  Urology was consulted given that she spiked another temperature and they evaluated the patient skin intact there is nothing drainable and no abscesses and recommending continuing antibiotic course.  Blood cultures x 2 have been repeated and showing NGTD. She is slowly improving and Renal Fxn is continuing to trend down.   Assessment and Plan:  Pyelonephritis Sepsis secondary from E. coli pyelonephritis and E. coli bacteremia -Initial presentation patient found febrile, tachycardic, elevated lactic acid and lab work showed leukocytosis 28.  Evidence suggestive sepsis in the setting of pyelonephritis but further workup also reveals that she is going to E. coli bacteremia 4 out of 4 bottles. -Sepsis physiology was improving however she spiked a temperature again this afternoon so we repeated blood cultures x 2 and BLOOD CX x2 on 05/04/23 done and showed NGTD so far -CBC On Admission showed evidence of leukocytosis 28.5 otherwise unremarkable and was also on Steroids PTA -UA has evidence, moderate hemoglobin, protein 100, moderate leukocyte esterase and many bacteria evidence of UTI.  Urine culture done and showing 40,000 colony forming units of E. coli with sensitivities pending and blood cultures done x 2 showing 4 out of 4 bottles of E. coli -Initial presentation lactic acid 2.1. -CT Renal Stone was ordered which showed nonspecific bilateral perinephric stranding concerning for pyelonephritis, no urinary tract calculi or evidence of hydronephrosis.   -In the Emergency department patient received ceftriaxone.  With sepsis protocoL patient being started on high flow IV fluid NS 150 cc/hr and we have now weaned this to 75 MLS per hour -Continue sepsis protocol -Continued broad-spectrum antibiotics cefepime per pharmacy protocol and this has now been  transitioned to IV ceftriaxone -WBC and Lactic Acid Level Trend: Recent Labs  Lab 05/01/23 1819 05/01/23 2102 05/02/23 0129  05/03/23 0443 05/04/23 0335 05/05/23 0141  WBC 28.5*  --  21.4* 11.9* 7.5 9.6  LATICACIDVEN  --    < > 1.1  --   --   --    < > = values in this interval not displayed.  -Follow-up with blood and urine culture result for appropriate antibiotic guidance as sensitivities are still pending -This was discussed with urology who feels that there is no abscesses to drain and recommends continuing current course for now -CBC, CMP, mag, Phos in a.m.   Hyponatremia, improved  -CMP showed corrected sodium is 130 with high blood glucose 230. -Acute onset of hyponatremia in the setting of AKI and likely Hypovolemic  -If morning a.m. sodium has been corrected more than 4 to 6 meq will change IV fluid to LR. -Na+ Trend: Recent Labs  Lab 05/01/23 1819 05/02/23 0129 05/03/23 0443 05/04/23 0335 05/05/23 0141  NA 128* 131* 130* 135 135  -Given that development of acute hyponatremia the setting of acute kidney injury not checking urinary osmolarity, urine sodium and serum osmolarity at this time. -Continue IVF as above -Continue to Monitor and Trend and repeat CMP in the AM   Prerenal AKI -CMP showed elevated creatinine 2.06. -Normal renal function at baseline -BUN/Cr Trend: Recent Labs  Lab 05/01/23 1819 05/02/23 0129 05/03/23 0443 05/04/23 0335 05/05/23 0141  BUN 28* 29* 28* 24* 19  CREATININE 2.06* 2.06* 1.99* 1.75* 1.49*  -Acute kidney injury in the setting of sepsis tachycardia decreasing effective blood flow to kidney and hypoperfusion.  Not concerning for intrinsic renal and postrenal obstruction but will check bladder scan to evaluate and if necessary a renal ultrasound -CT abdomen pelvis showed evidence of pyelonephritis.  No concern for hydronephrosis and urinary tract calculi. -Continue IV fluid but increase Normal Saline 75 mL/hr to 100 mL/h per nephrology recommendations -Urine osmolality, urine sodium, urine creatinine to be ordered and may need nephrology involvement given her  minimal improvement -Avoid Nephrotoxic Medications, Contrast Dyes, Hypotension and Dehydration to Ensure Adequate Renal Perfusion and will need to Renally Adjust Meds -Continue to Monitor and Trend Renal Function carefully and repeat CMP in the AM  -Discussed with Nephrology about the patient's case and they recommended increasing her fluids and checking CK level recommended continuing the course for now and will continue IV NS at 75 mL/hr  Hypokalemia -Patient's K+ Level Trend: Recent Labs  Lab 05/01/23 1819 05/02/23 0129 05/03/23 0443 05/04/23 0335 05/05/23 0141  K 3.7 3.2* 4.2 3.9 3.7  -Continue to Monitor and Replete as Necessary -Repeat CMP in the AM   Hypomagnesemia -Patient's Mag Level Trend: Recent Labs  Lab 05/02/23 0129 05/03/23 0443 05/04/23 0335 05/05/23 0141  MG 1.3* 2.3 2.3 2.0  -Continue to Monitor and Replete as Necessary -Repeat Mag in the AM   Non Anion Gap Metabolic Acidosis -Was improving but now dropped slightly. still low as CO2 is 19, anion gap is 11, chloride level 105 -Start Sodium Bicarbonate 650 mg po BID -Continue to Monitor and Trend and repeat CMP in the AM    Hyperlipidemia -Continue to Hold Statin for now given mildly abnormal LFTs  Abnormal LFTs -Mild and Reactive -LFT trend: Recent Labs  Lab 05/01/23 1819 05/02/23 0129 05/03/23 0443 05/04/23 0335 05/05/23 0141  AST 18 21 37 19 58*  ALT 29 31 47* 40 76*  -Continue to Monitor and Trend  Hepatic Fxn and if not improving will obtain a RUQ U/S and an Acute Hepatitis Panel -Repeat CMP in the AM   General Anxiety Disorder -C/w Resumed home bupropion 150 mg daily    Diabetes mellitus type 2-non-insulin-dependent -Most recent HbA1c is 6. -Patient reported taking Mounjaro 12.5 mg once a week. -Continue Diabetic Diet, sliding scale SSI and check POC 3 times daily with meal and bedtime. -Continue to Monitor and CBG and Glucose Trend: Recent Labs  Lab 05/01/23 1819 05/02/23 0129  05/02/23 0906 05/03/23 0443 05/03/23 0810 05/04/23 0335 05/04/23 0754 05/05/23 0141 05/05/23 0745 05/05/23 1138  GLUCOSE 216* 146*  --  128*  --  113*  --  125*  --   --   GLUCAP  --   --    < >  --    < >  --    < >  --    < > 171*   < > = values in this interval not displayed.   Normocytic Anemia -Likely Hemoconcentrated on Admission and had a Dilutional drop -Hgb/Hct Trend: Recent Labs  Lab 05/01/23 1819 05/02/23 0129 05/03/23 0443 05/04/23 0335 05/05/23 0141  HGB 12.4 10.3* 11.0* 9.9* 8.7*  HCT 36.2 30.4* 33.0* 29.9* 26.1*  MCV 84.4 83.5 85.5 85.2 86.7  -Check Anemia Panel showed an iron level of 7, UIBC of 192, TIBC 199, saturation ratios of 4%, ferritin level 413, folate level of 27.7 and a vitamin B12 of 293 -Continue to Monitor for S/Sx of Bleeding; No overt bleeding noted -Repeat CBC in the AM  Thrombocytopenia -In the Setting of Sepsis -Patient's Plt Count Trend: Recent Labs  Lab 05/01/23 1819 05/02/23 0129 05/03/23 0443 05/04/23 0335 05/05/23 0141  PLT 179 129* 128* 125* 135*  -Continue to Monitor and Trend and Repeat CBC in the AM  Left Buttock pain with radiation down her leg -There is concern for sciatica -Initiate gabapentin 100 mg twice daily -CT Scan done and showed "Nonspecific bilateral perinephric stranding. Correlate for signs of urinary tract infection. No urinary tract calculi or hydronephrosis. Aortic Atherosclerosis" -Given her persistence we will obtain a lumbar MRI as well as a sacrum MRI for further evaluation given her infection -MRI done and showed "Diffuse intramuscular edema involving the lower posterior paraspinous musculature, left greater than right. Findings are nonspecific, but could reflect changes of acute myositis, which could be either infectious or inflammatory in nature. Changes of muscular injury/strain would be the primary differential consideration. No loculated collections. No other evidence for acute infection within  the lumbar spine or sacrum. 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 Additional moderately advanced lower lumbar facet hypertrophy, which could contribute to lower back pain. Moderate volume free fluid within the pelvis, nonspecific.  Fibroid uterus." -Ordered K-pad (Aquathermia) and still pending   Hypoalbuminemia -Patient's Albumin Trend: Recent Labs  Lab 05/01/23 1819 05/02/23 0129 05/03/23 0443 05/04/23 0335 05/05/23 0141  ALBUMIN 4.0 2.7* 2.5* 2.3* 2.2*  -Continue to Monitor and Trend and repeat CMP in the AM   DVT prophylaxis: heparin injection 5,000 Units Start: 05/03/23 1615 SCDs Start: 05/01/23 2227    Code Status: Full Code Family Communication: No family present at bedside  Disposition Plan:  Level of care: Med-Surg Status is: Inpatient Remains inpatient appropriate because: Slowly improving and will need to be Afebrile for at least 24 hours prior to D/C and anticipating D/C in the next 24 to 48 hours if improving further  Consultants:  Case discussed with nephrology Case was discussed with urology  Procedures:  As delineated as above  Antimicrobials:  Anti-infectives (From admission, onward)    Start     Dose/Rate Route Frequency Ordered Stop   05/03/23 1200  cefTRIAXone (ROCEPHIN) 2 g in sodium chloride 0.9 % 100 mL IVPB        2 g 200 mL/hr over 30 Minutes Intravenous Every 24 hours 05/03/23 1106 05/08/23 1159   05/01/23 2315  ceFEPIme (MAXIPIME) 2 g in sodium chloride 0.9 % 100 mL IVPB  Status:  Discontinued        2 g 200 mL/hr over 30 Minutes Intravenous Daily at bedtime 05/01/23 2228 05/03/23 1106   05/01/23 2100  cefTRIAXone (ROCEPHIN) 2 g in sodium chloride 0.9 % 100 mL IVPB        2 g 200 mL/hr over 30 Minutes Intravenous  Once 05/01/23 2049 05/01/23 2148   05/01/23 2030  cefTRIAXone (ROCEPHIN) 1 g in sodium chloride 0.9 % 100 mL IVPB  Status:   Discontinued        1 g 200 mL/hr over 30 Minutes Intravenous  Once 05/01/23 2019 05/01/23 2049       Subjective: Seen and examined at bedside and still very uncomfortable and states that she did not get the K-pad yet. Intermittently still having pain.  Did not spike a temperature this afternoon.  No nausea or vomiting.  Feels okay otherwise.  Objective: Vitals:   05/04/23 1958 05/05/23 0500 05/05/23 0712 05/05/23 1546  BP: 120/65 (!) 150/71 139/71 128/71  Pulse: 75 90 87 75  Resp: 17 17 16 16   Temp: 98.5 F (36.9 C) 100.3 F (37.9 C) 98.3 F (36.8 C) 98.5 F (36.9 C)  TempSrc: Oral Oral Oral Oral  SpO2: 98% 100% 96% 100%  Weight:      Height:        Intake/Output Summary (Last 24 hours) at 05/05/2023 1705 Last data filed at 05/05/2023 1400 Gross per 24 hour  Intake 960 ml  Output --  Net 960 ml   Filed Weights   05/03/23 0437 05/04/23 0500 05/04/23 0638  Weight: 82 kg 80.9 kg 78.9 kg   Examination: Physical Exam:  Constitutional: WN/WD overweight Caucasian female in no acute distress Respiratory: Diminished to auscultation bilaterally, no wheezing, rales, rhonchi or crackles. Normal respiratory effort and patient is not tachypenic. No accessory muscle use.  Unlabored breathing Cardiovascular: RRR, no murmurs / rubs / gallops. S1 and S2 auscultated. No extremity edema.  Abdomen: Soft, non-tender, distended secondary to body habitus. Bowel sounds positive.  GU: Deferred. Musculoskeletal: No clubbing / cyanosis of digits/nails. No joint deformity upper and lower extremities.  Skin: No rashes, lesions, ulcers. No induration; Warm and dry.  Neurologic: CN 2-12 grossly intact with no focal deficits. Romberg sign and cerebellar reflexes not assessed.  Psychiatric: Normal judgment and insight. Alert and oriented x 3. Normal mood and appropriate affect.   Data Reviewed: I have personally reviewed following labs and imaging studies  CBC: Recent Labs  Lab 05/01/23 1819  05/02/23 0129 05/03/23 0443 05/04/23 0335 05/05/23 0141  WBC 28.5* 21.4* 11.9* 7.5 9.6  NEUTROABS  --  18.5* 11.3* 6.1 7.3  HGB 12.4 10.3* 11.0* 9.9* 8.7*  HCT 36.2 30.4* 33.0* 29.9* 26.1*  MCV 84.4 83.5 85.5 85.2 86.7  PLT 179 129* 128* 125* 135*   Basic Metabolic Panel: Recent Labs  Lab 05/01/23 1819 05/02/23 0129 05/03/23 0443 05/04/23 0335 05/05/23 0141  NA 128* 131*  130* 135 135  K 3.7 3.2* 4.2 3.9 3.7  CL 94* 95* 102 105 105  CO2 21* 21* 20* 21* 19*  GLUCOSE 216* 146* 128* 113* 125*  BUN 28* 29* 28* 24* 19  CREATININE 2.06* 2.06* 1.99* 1.75* 1.49*  CALCIUM 9.1 8.1* 7.9* 8.1* 8.0*  MG  --  1.3* 2.3 2.3 2.0  PHOS  --   --  2.0* 3.0 2.8   GFR: Estimated Creatinine Clearance: 41.2 mL/min (A) (by C-G formula based on SCr of 1.49 mg/dL (H)). Liver Function Tests: Recent Labs  Lab 05/01/23 1819 05/02/23 0129 05/03/23 0443 05/04/23 0335 05/05/23 0141  AST 18 21 37 19 58*  ALT 29 31 47* 40 76*  ALKPHOS 66 58 88 93 129*  BILITOT 1.1 0.9 0.7 0.5 0.3  PROT 7.0 5.5* 6.0* 5.5* 5.1*  ALBUMIN 4.0 2.7* 2.5* 2.3* 2.2*   No results for input(s): "LIPASE", "AMYLASE" in the last 168 hours. No results for input(s): "AMMONIA" in the last 168 hours. Coagulation Profile: Recent Labs  Lab 05/01/23 2246  INR 1.4*   Cardiac Enzymes: Recent Labs  Lab 05/04/23 0335  CKTOTAL 53   BNP (last 3 results) No results for input(s): "PROBNP" in the last 8760 hours. HbA1C: No results for input(s): "HGBA1C" in the last 72 hours. CBG: Recent Labs  Lab 05/04/23 1719 05/04/23 2139 05/05/23 0745 05/05/23 1138 05/05/23 1701  GLUCAP 184* 124* 103* 171* 114*   Lipid Profile: No results for input(s): "CHOL", "HDL", "LDLCALC", "TRIG", "CHOLHDL", "LDLDIRECT" in the last 72 hours. Thyroid Function Tests: No results for input(s): "TSH", "T4TOTAL", "FREET4", "T3FREE", "THYROIDAB" in the last 72 hours. Anemia Panel: Recent Labs    05/03/23 0443  VITAMINB12 293  FOLATE 27.7   FERRITIN 413*  TIBC 199*  IRON 7*  RETICCTPCT 1.1   Sepsis Labs: Recent Labs  Lab 05/01/23 2102 05/01/23 2242 05/01/23 2246 05/01/23 2312 05/02/23 0129  PROCALCITON  --   --  24.60  --   --   LATICACIDVEN 2.1* 1.8  --  1.8 1.1    Recent Results (from the past 240 hour(s))  Urine Culture     Status: Abnormal   Collection Time: 05/01/23  8:49 PM   Specimen: Urine, Clean Catch  Result Value Ref Range Status   Specimen Description   Final    URINE, CLEAN CATCH Performed at [facility], [address] [place], [place] [address]    Special Requests   Final    NONE Performed at [facility], [address], [place], [place] [address]    Culture 40,000 COLONIES/mL ESCHERICHIA COLI (A)  Final   Report Status 05/04/2023 FINAL  Final   Organism ID, Bacteria ESCHERICHIA COLI (A)  Final      Susceptibility   Escherichia coli - MIC*    AMPICILLIN >=32 RESISTANT Resistant     CEFAZOLIN <=4 SENSITIVE Sensitive     CEFEPIME <=0.12 SENSITIVE Sensitive     CEFTRIAXONE <=0.25 SENSITIVE Sensitive     CIPROFLOXACIN <=0.25 SENSITIVE Sensitive     GENTAMICIN <=1 SENSITIVE Sensitive     IMIPENEM <=0.25 SENSITIVE Sensitive     NITROFURANTOIN <=16 SENSITIVE Sensitive     TRIMETH/SULFA >=320 RESISTANT Resistant     AMPICILLIN/SULBACTAM 4 SENSITIVE Sensitive     PIP/TAZO <=4 SENSITIVE Sensitive     * 40,000 COLONIES/mL ESCHERICHIA COLI  Blood culture (routine x 2)     Status: Abnormal   Collection Time: 05/01/23  9:15 PM   Specimen:  BLOOD  Result Value Ref Range Status   Specimen Description BLOOD RIGHT ANTECUBITAL  Final   Special Requests   Final    BOTTLES DRAWN AEROBIC AND ANAEROBIC Blood Culture adequate volume   Culture  Setup Time   Final    GRAM NEGATIVE RODS IN BOTH AEROBIC AND ANAEROBIC BOTTLES CRITICAL RESULT CALLED TO, READ BACK BY AND VERIFIED WITH: PHARMD JIMMY WYLAND ON 05/02/23 @ 1845 BY DRT    Culture (A)  Final    ESCHERICHIA  COLI SUSCEPTIBILITIES PERFORMED ON PREVIOUS CULTURE WITHIN THE LAST 5 DAYS.    Report Status 05/04/2023 FINAL  Final  Blood culture (routine x 2)     Status: Abnormal   Collection Time: 05/01/23  9:30 PM   Specimen: BLOOD  Result Value Ref Range Status   Specimen Description   Final    BLOOD LEFT ANTECUBITAL Performed at [facility], [address], [place], [place] [address]    Special Requests   Final    BOTTLES DRAWN AEROBIC AND ANAEROBIC Blood Culture adequate volume Performed at [facility], [address] [place], [place] [address]    Culture  Setup Time   Final    GRAM NEGATIVE RODS IN BOTH AEROBIC AND ANAEROBIC BOTTLES CRITICAL RESULT CALLED TO, READ BACK BY AND VERIFIED WITH: PHARMD JIMMY WYLAND ON 05/02/23 @ 1845 BY DRT Performed at [facility], [address], [place], [place] [address]    Culture ESCHERICHIA COLI (A)  Final   Report Status 05/04/2023 FINAL  Final   Organism ID, Bacteria ESCHERICHIA COLI  Final      Susceptibility   Escherichia coli - MIC*    AMPICILLIN >=32 RESISTANT Resistant     CEFEPIME <=0.12 SENSITIVE Sensitive     CEFTAZIDIME <=1 SENSITIVE Sensitive     CEFTRIAXONE <=0.25 SENSITIVE Sensitive     CIPROFLOXACIN <=0.25 SENSITIVE Sensitive     GENTAMICIN <=1 SENSITIVE Sensitive     IMIPENEM <=0.25 SENSITIVE Sensitive     TRIMETH/SULFA >=320 RESISTANT Resistant     AMPICILLIN/SULBACTAM 8 SENSITIVE Sensitive     PIP/TAZO <=4 SENSITIVE Sensitive     * ESCHERICHIA COLI  Blood Culture ID Panel (Reflexed)     Status: Abnormal   Collection Time: 05/01/23  9:30 PM  Result Value Ref Range Status   Enterococcus faecalis NOT DETECTED NOT DETECTED Final   Enterococcus Faecium NOT DETECTED NOT DETECTED Final   Listeria monocytogenes NOT DETECTED NOT DETECTED Final   Staphylococcus species NOT DETECTED NOT DETECTED Final   Staphylococcus aureus (BCID) NOT DETECTED NOT DETECTED Final   Staphylococcus  epidermidis NOT DETECTED NOT DETECTED Final   Staphylococcus lugdunensis NOT DETECTED NOT DETECTED Final   Streptococcus species NOT DETECTED NOT DETECTED Final   Streptococcus agalactiae NOT DETECTED NOT DETECTED Final   Streptococcus pneumoniae NOT DETECTED NOT DETECTED Final   Streptococcus pyogenes NOT DETECTED NOT DETECTED Final   A.calcoaceticus-baumannii NOT DETECTED NOT DETECTED Final   Bacteroides fragilis NOT DETECTED NOT DETECTED Final   Enterobacterales DETECTED (A) NOT DETECTED Final    Comment: Enterobacterales represent a large order of gram negative bacteria, not a single organism. CRITICAL RESULT CALLED TO, READ BACK BY AND VERIFIED WITH: PHARMD JIMMY WYLAND ON 05/02/23 @ 1845 BY DRT    Enterobacter cloacae complex NOT DETECTED NOT DETECTED Final   Escherichia coli DETECTED (A) NOT DETECTED Final    Comment: CRITICAL RESULT CALLED TO, READ BACK BY AND VERIFIED WITH: PHARMD JIMMY WYLAND ON 05/02/23 @  1845 BY DRT    Klebsiella aerogenes NOT DETECTED NOT DETECTED Final   Klebsiella oxytoca NOT DETECTED NOT DETECTED Final   Klebsiella pneumoniae NOT DETECTED NOT DETECTED Final   Proteus species NOT DETECTED NOT DETECTED Final   Salmonella species NOT DETECTED NOT DETECTED Final   Serratia marcescens NOT DETECTED NOT DETECTED Final   Haemophilus influenzae NOT DETECTED NOT DETECTED Final   Neisseria meningitidis NOT DETECTED NOT DETECTED Final   Pseudomonas aeruginosa NOT DETECTED NOT DETECTED Final   Stenotrophomonas maltophilia NOT DETECTED NOT DETECTED Final   Candida albicans NOT DETECTED NOT DETECTED Final   Candida auris NOT DETECTED NOT DETECTED Final   Candida glabrata NOT DETECTED NOT DETECTED Final   Candida krusei NOT DETECTED NOT DETECTED Final   Candida parapsilosis NOT DETECTED NOT DETECTED Final   Candida tropicalis NOT DETECTED NOT DETECTED Final   Cryptococcus neoformans/gattii NOT DETECTED NOT DETECTED Final   CTX-M ESBL NOT DETECTED NOT DETECTED Final    Carbapenem resistance IMP NOT DETECTED NOT DETECTED Final   Carbapenem resistance KPC NOT DETECTED NOT DETECTED Final   Carbapenem resistance NDM NOT DETECTED NOT DETECTED Final   Carbapenem resist OXA 48 LIKE NOT DETECTED NOT DETECTED Final   Carbapenem resistance VIM NOT DETECTED NOT DETECTED Final    Comment: Performed at [facility], [address], [place], [place] [address]  Culture, blood (Routine X 2) w Reflex to ID Panel     Status: None (Preliminary result)   Collection Time: 05/04/23  4:40 PM   Specimen: BLOOD  Result Value Ref Range Status   Specimen Description BLOOD RIGHT ANTECUBITAL  Final   Special Requests   Final    BOTTLES DRAWN AEROBIC AND ANAEROBIC Blood Culture adequate volume   Culture   Final    NO GROWTH < 24 HOURS Performed at [facility], [address], [place], [place] [address]    Report Status PENDING  Incomplete  Culture, blood (Routine X 2) w Reflex to ID Panel     Status: None (Preliminary result)   Collection Time: 05/04/23  4:50 PM   Specimen: BLOOD RIGHT HAND  Result Value Ref Range Status   Specimen Description BLOOD RIGHT HAND  Final   Special Requests   Final    BOTTLES DRAWN AEROBIC AND ANAEROBIC Blood Culture adequate volume   Culture   Final    NO GROWTH < 24 HOURS Performed at [facility], [address], [place], [place] [address]    Report Status PENDING  Incomplete    Radiology Studies: MR SACRUM SI JOINTS WO CONTRAST  Result Date: 05/03/2023 CLINICAL DATA:  Initial evaluation for lumbar radiculopathy, low back pain, infection suspected. EXAM: MRI LUMBAR SPINE WITHOUT CONTRAST MRI SACRUM WITHOUT CONTRAST TECHNIQUE: Multiplanar, multisequence MR imaging of the lumbar spine was performed. No intravenous contrast was administered. COMPARISON:  CT from 05/01/2023. FINDINGS: MRI LUMBAR SPINE: Segmentation: Standard. Lowest well-formed disc space labeled the L5-S1 level. Alignment: 4 mm anterolisthesis of L4 on L5,  with trace 2 mm anterolisthesis of L5 on S1. Findings chronic and facet mediated. Alignment otherwise normal preservation of the normal lumbar lordosis. Vertebrae: Vertebral body height maintained without acute or chronic fracture. Bone marrow signal intensity within normal limits. Few scattered subcentimeter benign hemangiomata noted. No worrisome osseous lesions. No evidence for osteomyelitis discitis or septic arthritis. Conus medullaris and cauda equina: Conus extends to the T12-L1 level. Conus and cauda equina appear normal. Paraspinal and other soft tissues:  Mild diffuse edema present within the lower posterior paraspinous musculature, extending inferiorly towards the sacrum. Overall, changes are slightly worse on the left (series 6, images 12, 2). Mild edema within the overlying subcutaneous fat. Findings are nonspecific. No loculated soft tissue collections. Mild bilateral perinephric stranding again noted. Disc levels: L1-2: N disc desiccation without significant disc bulge. Mild facet hypertrophy. No stenosis. L2-3: Disc desiccation with minimal left eccentric disc bulge. Mild to moderate facet hypertrophy. No spinal stenosis. Foramina remain patent. L3-4: Disc desiccation with minimal annular disc bulge. Moderate facet hypertrophy. Superimposed 4 mm synovial cyst at the anterior margin of the right L3-4 facet. Resultant mild spinal stenosis. Mild bilateral L3 foraminal narrowing. L4-5: 4 mm anterolisthesis. Disc desiccation with mild disc bulge. Left extraforaminal annular fissure noted. Severe bilateral facet arthrosis. Tiny probable 5 mm synovial cyst noted at the anterior margin of the right L4-5 facet at the level of the neural foramen (series 9, image 29). Resultant moderate spinal stenosis. Mild to moderate bilateral L4 foraminal narrowing. L5-S1: Trace anterolisthesis. Disc desiccation with mild disc bulge. Mild reactive endplate spurring. Mild to moderate facet hypertrophy. Resultant mild  narrowing of the right lateral recess. Central canal remains patent. No significant foraminal stenosis. MRI SACRUM: Bone marrow signal intensity within normal limits. Few scattered subcentimeter benign hemangiomata noted. No worrisome osseous lesions. Sacroiliac joints demonstrate a normal and symmetric appearance. No juxta-articular edema or erosive changes to suggest sacroiliitis. No findings of septic arthritis. Diffuse soft tissue edema present within the left greater than right lower posterior paraspinous musculature (series 6, image 5). Mild edema within the gluteal musculature noted as well. Diffuse edema noted within the overlying subcutaneous fat. No loculated collections. Moderate volume free fluid noted within the pelvis, nonspecific. 1.3 cm subserosal fibroid present at the anterior uterine body. Small nabothian cyst noted at the cervix. Remainder of the visualized pelvic viscera demonstrate no acute or significant finding. Major vascular flow voids are maintained. No pathologically enlarged lymph nodes. IMPRESSION: 1. Diffuse intramuscular edema involving the lower posterior paraspinous musculature, left greater than right. Findings are nonspecific, but could reflect changes of acute myositis, which could be either infectious or inflammatory in nature. Changes of muscular injury/strain would be the primary differential consideration. No loculated collections. 2. No other evidence for acute infection within the lumbar spine or sacrum. 3. 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 4. Additional moderately advanced lower lumbar facet hypertrophy, which could contribute to lower back pain. 5. Moderate volume free fluid within the pelvis, nonspecific. 6. Fibroid uterus. Electronically Signed   By: Rise Mu M.D.   On: 05/03/2023 20:18   MR LUMBAR SPINE WO CONTRAST  Result Date: 05/03/2023 CLINICAL  DATA:  Initial evaluation for lumbar radiculopathy, low back pain, infection suspected. EXAM: MRI LUMBAR SPINE WITHOUT CONTRAST MRI SACRUM WITHOUT CONTRAST TECHNIQUE: Multiplanar, multisequence MR imaging of the lumbar spine was performed. No intravenous contrast was administered. COMPARISON:  CT from 05/01/2023. FINDINGS: MRI LUMBAR SPINE: Segmentation: Standard. Lowest well-formed disc space labeled the L5-S1 level. Alignment: 4 mm anterolisthesis of L4 on L5, with trace 2 mm anterolisthesis of L5 on S1. Findings chronic and facet mediated. Alignment otherwise normal preservation of the normal lumbar lordosis. Vertebrae: Vertebral body height maintained without acute or chronic fracture. Bone marrow signal intensity within normal limits. Few scattered subcentimeter benign hemangiomata noted. No worrisome osseous lesions. No evidence for osteomyelitis discitis or septic arthritis. Conus medullaris and cauda equina:  Conus extends to the T12-L1 level. Conus and cauda equina appear normal. Paraspinal and other soft tissues: Mild diffuse edema present within the lower posterior paraspinous musculature, extending inferiorly towards the sacrum. Overall, changes are slightly worse on the left (series 6, images 12, 2). Mild edema within the overlying subcutaneous fat. Findings are nonspecific. No loculated soft tissue collections. Mild bilateral perinephric stranding again noted. Disc levels: L1-2: N disc desiccation without significant disc bulge. Mild facet hypertrophy. No stenosis. L2-3: Disc desiccation with minimal left eccentric disc bulge. Mild to moderate facet hypertrophy. No spinal stenosis. Foramina remain patent. L3-4: Disc desiccation with minimal annular disc bulge. Moderate facet hypertrophy. Superimposed 4 mm synovial cyst at the anterior margin of the right L3-4 facet. Resultant mild spinal stenosis. Mild bilateral L3 foraminal narrowing. L4-5: 4 mm anterolisthesis. Disc desiccation with mild disc bulge.  Left extraforaminal annular fissure noted. Severe bilateral facet arthrosis. Tiny probable 5 mm synovial cyst noted at the anterior margin of the right L4-5 facet at the level of the neural foramen (series 9, image 29). Resultant moderate spinal stenosis. Mild to moderate bilateral L4 foraminal narrowing. L5-S1: Trace anterolisthesis. Disc desiccation with mild disc bulge. Mild reactive endplate spurring. Mild to moderate facet hypertrophy. Resultant mild narrowing of the right lateral recess. Central canal remains patent. No significant foraminal stenosis. MRI SACRUM: Bone marrow signal intensity within normal limits. Few scattered subcentimeter benign hemangiomata noted. No worrisome osseous lesions. Sacroiliac joints demonstrate a normal and symmetric appearance. No juxta-articular edema or erosive changes to suggest sacroiliitis. No findings of septic arthritis. Diffuse soft tissue edema present within the left greater than right lower posterior paraspinous musculature (series 6, image 5). Mild edema within the gluteal musculature noted as well. Diffuse edema noted within the overlying subcutaneous fat. No loculated collections. Moderate volume free fluid noted within the pelvis, nonspecific. 1.3 cm subserosal fibroid present at the anterior uterine body. Small nabothian cyst noted at the cervix. Remainder of the visualized pelvic viscera demonstrate no acute or significant finding. Major vascular flow voids are maintained. No pathologically enlarged lymph nodes. IMPRESSION: 1. Diffuse intramuscular edema involving the lower posterior paraspinous musculature, left greater than right. Findings are nonspecific, but could reflect changes of acute myositis, which could be either infectious or inflammatory in nature. Changes of muscular injury/strain would be the primary differential consideration. No loculated collections. 2. No other evidence for acute infection within the lumbar spine or sacrum. 3. Multifactorial  degenerative changes at L4-5 with resultant moderate spinal stenosis, with mild to moderate bilateral L4 foraminal narrowing. 4. Additional moderately advanced lower lumbar facet hypertrophy, which could contribute to lower back pain. 5. Moderate volume free fluid within the pelvis, nonspecific. 6. Fibroid uterus. Electronically Signed   By: Rise Mu M.D.   On: 05/03/2023 20:18    Scheduled Meds:  buPROPion  150 mg Oral Daily   diclofenac Sodium  2 g Topical QID   gabapentin  200 mg Oral BID   heparin  5,000 Units Subcutaneous Q8H   insulin aspart  0-6 Units Subcutaneous TID WC   lidocaine  1 patch Transdermal Q24H   sodium bicarbonate  650 mg Oral BID   sodium chloride flush  3 mL Intravenous Q12H   Continuous Infusions:  sodium chloride     sodium chloride 100 mL/hr at 05/05/23 1212   cefTRIAXone (ROCEPHIN)  IV 2 g (05/05/23 1243)    LOS: 3 days   Marguerita Merles, DO [facility] Available via Epic secure chat 7am-7pm After these  hours, please refer to coverage provider listed on [email or web address] 05/05/2023, 5:05 PM

## 2023-05-06 ENCOUNTER — Inpatient Hospital Stay (HOSPITAL_COMMUNITY): Payer: No Typology Code available for payment source

## 2023-05-06 DIAGNOSIS — E785 Hyperlipidemia, unspecified: Secondary | ICD-10-CM | POA: Diagnosis not present

## 2023-05-06 DIAGNOSIS — F411 Generalized anxiety disorder: Secondary | ICD-10-CM | POA: Diagnosis not present

## 2023-05-06 DIAGNOSIS — N179 Acute kidney failure, unspecified: Secondary | ICD-10-CM | POA: Diagnosis not present

## 2023-05-06 DIAGNOSIS — N12 Tubulo-interstitial nephritis, not specified as acute or chronic: Secondary | ICD-10-CM | POA: Diagnosis not present

## 2023-05-06 LAB — COMPREHENSIVE METABOLIC PANEL
ALT: 105 U/L — ABNORMAL HIGH (ref 0–44)
AST: 65 U/L — ABNORMAL HIGH (ref 15–41)
Albumin: 2.3 g/dL — ABNORMAL LOW (ref 3.5–5.0)
Alkaline Phosphatase: 164 U/L — ABNORMAL HIGH (ref 38–126)
Anion gap: 8 (ref 5–15)
BUN: 16 mg/dL (ref 8–23)
CO2: 20 mmol/L — ABNORMAL LOW (ref 22–32)
Calcium: 7.9 mg/dL — ABNORMAL LOW (ref 8.9–10.3)
Chloride: 109 mmol/L (ref 98–111)
Creatinine, Ser: 1.23 mg/dL — ABNORMAL HIGH (ref 0.44–1.00)
GFR, Estimated: 49 mL/min — ABNORMAL LOW (ref >60)
Glucose, Bld: 135 mg/dL — ABNORMAL HIGH (ref 70–99)
Potassium: 3.5 mmol/L (ref 3.5–5.1)
Sodium: 137 mmol/L (ref 135–145)
Total Bilirubin: 0.6 mg/dL (ref 0.3–1.2)
Total Protein: 5.3 g/dL — ABNORMAL LOW (ref 6.5–8.1)

## 2023-05-06 LAB — CBC WITH DIFFERENTIAL/PLATELET
Abs Immature Granulocytes: 0 10*3/uL (ref 0.00–0.07)
Basophils Absolute: 0 10*3/uL (ref 0.0–0.1)
Basophils Relative: 0 %
Eosinophils Absolute: 0.3 10*3/uL (ref 0.0–0.5)
Eosinophils Relative: 3 %
HCT: 26.3 % — ABNORMAL LOW (ref 36.0–46.0)
Hemoglobin: 8.7 g/dL — ABNORMAL LOW (ref 12.0–15.0)
Lymphocytes Relative: 5 %
Lymphs Abs: 0.4 10*3/uL — ABNORMAL LOW (ref 0.7–4.0)
MCH: 28.9 pg (ref 26.0–34.0)
MCHC: 33.1 g/dL (ref 30.0–36.0)
MCV: 87.4 fL (ref 80.0–100.0)
Monocytes Absolute: 0.7 10*3/uL (ref 0.1–1.0)
Monocytes Relative: 8 %
Neutro Abs: 7.4 10*3/uL (ref 1.7–7.7)
Neutrophils Relative %: 84 %
Platelets: 159 10*3/uL (ref 150–400)
RBC: 3.01 MIL/uL — ABNORMAL LOW (ref 3.87–5.11)
RDW: 14.2 % (ref 11.5–15.5)
WBC: 8.8 10*3/uL (ref 4.0–10.5)
nRBC: 0 % (ref 0.0–0.2)
nRBC: 0 /100 WBC

## 2023-05-06 LAB — PHOSPHORUS: Phosphorus: 3.3 mg/dL (ref 2.5–4.6)

## 2023-05-06 LAB — GLUCOSE, CAPILLARY
Glucose-Capillary: 118 mg/dL — ABNORMAL HIGH (ref 70–99)
Glucose-Capillary: 115 mg/dL — ABNORMAL HIGH (ref 70–99)

## 2023-05-06 LAB — MAGNESIUM: Magnesium: 1.9 mg/dL (ref 1.7–2.4)

## 2023-05-06 LAB — CULTURE, BLOOD (ROUTINE X 2): Special Requests: ADEQUATE

## 2023-05-06 MED ORDER — ATORVASTATIN CALCIUM 10 MG PO TABS: 10.0000 mg | ORAL_TABLET | Freq: Every day | ORAL | 3 refills | Status: DC

## 2023-05-06 MED ORDER — BUTALBITAL-APAP-CAFFEINE 50-325-40 MG PO TABS
1.0000 | ORAL_TABLET | Freq: Once | ORAL | Status: AC
  Administered 2023-05-06: 1 via ORAL
  Filled 2023-05-06: qty 1

## 2023-05-06 MED ORDER — SODIUM BICARBONATE 650 MG PO TABS: 650.0000 mg | ORAL_TABLET | Freq: Two times a day (BID) | ORAL | 0 refills | Status: AC

## 2023-05-06 MED ORDER — ONDANSETRON HCL 4 MG PO TABS: 4.0000 mg | ORAL_TABLET | Freq: Four times a day (QID) | ORAL | 0 refills | Status: AC | PRN

## 2023-05-06 MED ORDER — GABAPENTIN 100 MG PO CAPS: 200.0000 mg | ORAL_CAPSULE | Freq: Three times a day (TID) | ORAL | Status: DC

## 2023-05-06 MED ORDER — GABAPENTIN 100 MG PO CAPS: 200.0000 mg | ORAL_CAPSULE | Freq: Three times a day (TID) | ORAL | 0 refills | Status: DC

## 2023-05-06 MED ORDER — POLYETHYLENE GLYCOL 3350 17 G PO PACK: 17.0000 g | PACK | Freq: Every day | ORAL | 0 refills | Status: DC | PRN

## 2023-05-06 MED ORDER — LIDOCAINE 5 % EX PTCH: 1.0000 | MEDICATED_PATCH | CUTANEOUS | 0 refills | Status: DC

## 2023-05-06 MED ORDER — DICLOFENAC SODIUM 1 % EX GEL: 2.0000 g | Freq: Four times a day (QID) | CUTANEOUS | 0 refills | Status: DC

## 2023-05-06 MED ORDER — CEFADROXIL 500 MG PO CAPS: 1000.0000 mg | ORAL_CAPSULE | Freq: Two times a day (BID) | ORAL | Status: DC

## 2023-05-06 MED ORDER — CEFADROXIL 500 MG PO CAPS: 1000.0000 mg | ORAL_CAPSULE | Freq: Two times a day (BID) | ORAL | 0 refills | Status: AC

## 2023-05-06 MED ORDER — FLUCONAZOLE 150 MG PO TABS: ORAL_TABLET | ORAL | 0 refills | Status: DC

## 2023-05-06 MED ORDER — TIRZEPATIDE 12.5 MG/0.5ML ~~LOC~~ SOAJ: 12.5000 mg | SUBCUTANEOUS | 5 refills | Status: DC

## 2023-05-06 NOTE — Discharge Summary (Signed)
Physician Discharge Summary   Patient: Tracy Anderson MRN: [id number] DOB: 11/24/1960  Admit date:     05/01/2023  Discharge date: 05/06/2023  Discharge Physician: Marguerita Merles, DO   PCP: Nelwyn Salisbury, MD   Recommendations at discharge:   Follow-up with PCP within 1 to 2 weeks and repeat CBC, CMP, mag, Phos within 1 week and follow-up on liver function panel and if still elevated have further outpatient workup and referral to GI if necessary Continue antibiotic course treatment for completion  Discharge Diagnoses: Principal Problem:   Pyelonephritis Active Problems:   Sepsis (HCC)   HLD (hyperlipidemia)   Hyponatremia   AKI (acute kidney injury) (HCC)   GAD (generalized anxiety disorder)  Resolved Problems:   * No resolved hospital problems. *  Hospital Course: Patient is a 62 year old Caucasian female with past medical history significant for but limited to hyperlipidemia, non-insulin-dependent diabetes mellitus type 2, generalized anxiety disorder as well as a comorbidities presented to [facility] emergency department complaint left-sided back and abdominal pain as well as buttock pain that radiated down her leg.  She was seen by her primary care doctor who was concerned for sciatica and initiated prednisone.  At home she developed low-grade fevers and chills and continued to have nausea and left-sided back pain and abdominal pain that radiated into her left groin.  She denies any dysuria or increased urinary frequency or hesitancy but did have abdominal pain.  In the ED she was found to have a temperature of 103 and was tachycardic with a blood pressure within normal range.  Labs revealed that she had a low bicarbonate level elevated BUN/creatinine.  UA was done and suggestive of UTI.  CT renal stone study was done and showed nonspecific bilateral perinephric stranding concerning for pyelonephritis no urinary tract calculi or evidence for hydronephrosis.  The ED she was  given IV ceftriaxone and sepsis protocol was initiated and she was initiated on IV fluid hydration and antibiotics were escalated to IV cefepime but now have de-escalated back to ceftriaxone.  She has been doing slightly better today but renal function continues to be elevated. Cultures E. coli in her blood cultures and in her urine.  Currently awaiting full sensitivities we will continue antibiotic coverage.  Patient continues to complain of some left-sided buttock and flank pain that radiates down her leg.  Will obtain further MRI imaging and also apply a K-pad.  She is slowly improving but renal function continues to be elevated so nephrology recommends increasing fluids.  Urology was consulted given that she spiked another temperature and they evaluated the patient skin intact there is nothing drainable and no abscesses and recommending continuing antibiotic course.  Blood cultures x 2 have been repeated and showing NGTD. She is slowly improving and Renal Fxn is continuing to trend down. She is much improved and stable for D/C and will need to follow up with PCP within 1 week.  Assessment and Plan:  Pyelonephritis Sepsis secondary from E. coli pyelonephritis and E. coli bacteremia -Initial presentation patient found febrile, tachycardic, elevated lactic acid and lab work showed leukocytosis 28.  Evidence suggestive sepsis in the setting of pyelonephritis but further workup also reveals that she is going to E. coli bacteremia 4 out of 4 bottles. -Sepsis physiology was improving however she spiked a temperature again this afternoon so we repeated blood cultures x 2 and BLOOD CX x2 on 05/04/23 done and showed NGTD so far -CBC On Admission showed evidence of leukocytosis 28.5 otherwise unremarkable  and was also on Steroids PTA -UA has evidence, moderate hemoglobin, protein 100, moderate leukocyte esterase and many bacteria evidence of UTI.  Urine culture done and showing 40,000 colony forming units of E.  coli with sensitivities pending and blood cultures done x 2 showing 4 out of 4 bottles of E. coli -Initial presentation lactic acid 2.1. -CT Renal Stone was ordered which showed nonspecific bilateral perinephric stranding concerning for pyelonephritis, no urinary tract calculi or evidence of hydronephrosis.   -In the Emergency department patient received ceftriaxone.  With sepsis protocoL patient being started on high flow IV fluid NS 150 cc/hr and we have now weaned this to 75 MLS per hour -Continue sepsis protocol -Continued broad-spectrum antibiotics cefepime per pharmacy protocol and this has now been transitioned to IV ceftriaxone and will transition to oral Cefadroxil as an outpatient.  -WBC and Lactic Acid Level Trend: Recent Labs  Lab 05/01/23 1819 05/01/23 2102 05/02/23 0129 05/03/23 0443 05/04/23 0335 05/05/23 0141 05/06/23 0121  WBC 28.5*  --  21.4* 11.9* 7.5 9.6 8.8  LATICACIDVEN  --    < > 1.1  --   --   --   --    < > = values in this interval not displayed.  -Follow-up with blood and urine culture result for appropriate antibiotic guidance as sensitivities are still pending -This was discussed with urology who feels that there is no abscesses to drain and recommends continuing current course for now -Follow up with PCP and Urology if Necessary    Hyponatremia, improved  -CMP showed corrected sodium is 130 with high blood glucose 230. -Acute onset of hyponatremia in the setting of AKI and likely Hypovolemic  -If morning a.m. sodium has been corrected more than 4 to 6 meq will change IV fluid to LR. -Na+ Trend: Recent Labs  Lab 05/01/23 1819 05/02/23 0129 05/03/23 0443 05/04/23 0335 05/05/23 0141 05/06/23 0121  NA 128* 131* 130* 135 135 137  -Given that development of acute hyponatremia the setting of acute kidney injury not checking urinary osmolarity, urine sodium and serum osmolarity at this time. -Continue IVF as above -Continue to Monitor and Trend and repeat  CMP in the AM   Prerenal AKI -CMP showed elevated creatinine 2.06. -Normal renal function at baseline -BUN/Cr Trend: Recent Labs  Lab 05/01/23 1819 05/02/23 0129 05/03/23 0443 05/04/23 0335 05/05/23 0141 05/06/23 0121  BUN 28* 29* 28* 24* 19 16  CREATININE 2.06* 2.06* 1.99* 1.75* 1.49* 1.23*  -Acute kidney injury in the setting of sepsis tachycardia decreasing effective blood flow to kidney and hypoperfusion.  Not concerning for intrinsic renal and postrenal obstruction but will check bladder scan to evaluate and if necessary a renal ultrasound -CT abdomen pelvis showed evidence of pyelonephritis.  No concern for hydronephrosis and urinary tract calculi. -Continue IV fluid but increase Normal Saline 75 mL/hr to 100 mL/h per nephrology recommendations -Urine osmolality, urine sodium, urine creatinine to be ordered and may need nephrology involvement given her minimal improvement -Avoid Nephrotoxic Medications, Contrast Dyes, Hypotension and Dehydration to Ensure Adequate Renal Perfusion and will need to Renally Adjust Meds -Continue to Monitor and Trend Renal Function carefully and repeat CMP in the AM  -Discussed with Nephrology about the patient's case and they recommended increasing her fluids and checking CK level recommended continuing the course for now and will continue IV NS at 75 mL/hr and she is much improved and stable for D/C  Hypokalemia -Patient's K+ Level Trend: Recent Labs  Lab 05/01/23 1819 05/02/23  0129 05/03/23 0443 05/04/23 0335 05/05/23 0141 05/06/23 0121  K 3.7 3.2* 4.2 3.9 3.7 3.5  -Continue to Monitor and Replete as Necessary -Repeat CMP within 1 week   Hypomagnesemia -Patient's Mag Level Trend: Recent Labs  Lab 05/02/23 0129 05/03/23 0443 05/04/23 0335 05/05/23 0141 05/06/23 0121  MG 1.3* 2.3 2.3 2.0 1.9  -Continue to Monitor and Replete as Necessary -Repeat Mag within 1 week   Non Anion Gap Metabolic Acidosis -Was improving but now dropped  slightly. still low as CO2 is 20, anion gap is 8, chloride level 109 -Start Sodium Bicarbonate 650 mg po BID -Continue to Monitor and Trend and repeat CMP within 1 week    Hyperlipidemia -Continue to Hold Statin for now given mildly abnormal LFTs and defer to PCP to Resume in the outpatient setting  Abnormal LFTs -Mild and Reactive -LFT trend: Recent Labs  Lab 05/01/23 1819 05/02/23 0129 05/03/23 0443 05/04/23 0335 05/05/23 0141 05/06/23 0121  AST 18 21 37 19 58* 65*  ALT 29 31 47* 40 76* 105*  -Continue to Monitor and Trend Hepatic Fxn and repeat in the outpatient setting and if not improving obtain an Acute Hepatitis Panel -RUQ U/S done and showed "No etiology for elevated LFTs identified. Incidentally visualized small right pleural effusion." -Repeat CMP within 1 week    General Anxiety Disorder -C/w Resumed home bupropion 150 mg daily    Diabetes mellitus type 2-non-insulin-dependent -Most recent HbA1c is 6. -Patient reported taking Mounjaro 12.5 mg once a week. -Continue Diabetic Diet, sliding scale SSI and check POC 3 times daily with meal and bedtime. -Continue to Monitor and CBG and Glucose Trend: Recent Labs  Lab 05/01/23 1819 05/02/23 0129 05/02/23 0906 05/03/23 0443 05/03/23 0810 05/04/23 0335 05/04/23 0754 05/05/23 0141 05/05/23 0745 05/06/23 0121 05/06/23 0815 05/06/23 1211  GLUCOSE 216* 146*  --  128*  --  113*  --  125*  --  135*  --   --   GLUCAP  --   --    < >  --    < >  --    < >  --    < >  --    < > 115*   < > = values in this interval not displayed.   Normocytic Anemia -Likely Hemoconcentrated on Admission and had a Dilutional drop -Hgb/Hct Trend: Recent Labs  Lab 05/01/23 1819 05/02/23 0129 05/03/23 0443 05/04/23 0335 05/05/23 0141 05/06/23 0121  HGB 12.4 10.3* 11.0* 9.9* 8.7* 8.7*  HCT 36.2 30.4* 33.0* 29.9* 26.1* 26.3*  MCV 84.4 83.5 85.5 85.2 86.7 87.4  -Check Anemia Panel showed an iron level of 7, UIBC of 192, TIBC 199,  saturation ratios of 4%, ferritin level 413, folate level of 27.7 and a vitamin B12 of 293 -Continue to Monitor for S/Sx of Bleeding; No overt bleeding noted -Repeat CBC within 1 week   Thrombocytopenia -In the Setting of Sepsis -Patient's Plt Count Trend: Recent Labs  Lab 05/01/23 1819 05/02/23 0129 05/03/23 0443 05/04/23 0335 05/05/23 0141 05/06/23 0121  PLT 179 129* 128* 125* 135* 159  -Continue to Monitor and Trend and Repeat CBC in the AM  Left Buttock pain with radiation down her leg -There is concern for sciatica -Initiate gabapentin 100 mg twice daily -CT Scan done and showed "Nonspecific bilateral perinephric stranding. Correlate for signs of urinary tract infection. No urinary tract calculi or hydronephrosis. Aortic Atherosclerosis" -Given her persistence we will obtain a lumbar MRI as well as a sacrum MRI  for further evaluation given her infection -MRI done and showed "Diffuse intramuscular edema involving the lower posterior paraspinous musculature, left greater than right. Findings are nonspecific, but could reflect changes of acute myositis, which could be either infectious or inflammatory in nature. Changes of muscular injury/strain would be the primary differential consideration. No loculated collections. No other evidence for acute infection within the lumbar spine or sacrum. 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 Additional moderately advanced lower lumbar facet hypertrophy, which could contribute to lower back pain. Moderate volume free fluid within the pelvis, nonspecific.  Fibroid uterus." -Ordered K-pad (Aquathermia) and after use had much improvement.  Hypoalbuminemia -Patient's Albumin Trend: Recent Labs  Lab 05/01/23 1819 05/02/23 0129 05/03/23 0443 05/04/23 0335 05/05/23 0141 05/06/23 0121  ALBUMIN 4.0 2.7* 2.5* 2.3* 2.2* 2.3*  -Continue to Monitor  and Trend and repeat CMP in the AM  Consultants: Discussed with Procedures performed: As delineated as above  Disposition: Home Diet recommendation:  Discharge Diet Orders (From admission, onward)     Start     Ordered   05/06/23 0000  Diet - low sodium heart healthy        05/06/23 1411   05/06/23 0000  Diet Carb Modified        05/06/23 1411           Cardiac and Carb modified diet DISCHARGE MEDICATION: Allergies as of 05/06/2023       Reactions   Lisinopril Cough   Shrimp [shellfish Allergy] Diarrhea   Severe stomach cramps and diarrhea   Clarithromycin Other (See Comments)   UNSPECIFIED         Medication List     STOP taking these medications    doxycycline 100 MG tablet Commonly known as: VIBRA-TABS   methylPREDNISolone 4 MG Tbpk tablet Commonly known as: MEDROL DOSEPAK   Prempro 0.45-1.5 MG tablet Generic drug: estrogen (conjugated)-medroxyprogesterone       TAKE these medications    atorvastatin 10 MG tablet Commonly known as: LIPITOR Take 1 tablet (10 mg total) by mouth daily. Have PCP check LFTs prior to Reinitiating Start taking on: May 15, 2023 What changed:  additional instructions These instructions start on May 15, 2023. If you are unsure what to do until then, ask your doctor or other care provider.   buPROPion 150 MG 24 hr tablet Commonly known as: WELLBUTRIN XL Take 1 tablet (150 mg total) by mouth daily.   cefadroxil 500 MG capsule Commonly known as: DURICEF Take 2 capsules (1,000 mg total) by mouth 2 (two) times daily for 8 days.   COD LIVER OIL PO Take 1 capsule by mouth daily.   fluconazole 150 MG tablet Commonly known as: Diflucan Take Pill if Necessary for Yeast Infection caused by Antibiotics and follow up with PCP for further treatment   freestyle lancets Use to check blood sugar 3 times a day   FREESTYLE LITE test strip Generic drug: glucose blood Use to check blood sugar 3 times a day   gabapentin 100 MG  capsule Commonly known as: NEURONTIN Take 2 capsules (200 mg total) by mouth 3 (three) times daily for 14 days.   lidocaine 5 % Commonly known as: LIDODERM Place 1 patch onto the skin daily. Remove & Discard patch within 12 hours or as directed by MD   multivitamin with minerals tablet Take 1 tablet by mouth daily.   ondansetron 4 MG tablet Commonly known as: ZOFRAN  Take 1 tablet (4 mg total) by mouth every 6 (six) hours as needed for nausea.   sodium bicarbonate 650 MG tablet Take 1 tablet (650 mg total) by mouth 2 (two) times daily for 3 days.   tirzepatide 12.5 MG/0.5ML Pen Commonly known as: MOUNJARO Inject 12.5 mg into the skin once a week. Start taking on: May 15, 2023   Vitamin D (Ergocalciferol) 1.25 MG (50000 UNIT) Caps capsule Commonly known as: DRISDOL Take 1 capsule (50,000 Units total) by mouth every 7 (seven) days.        Discharge Exam: Filed Weights   05/04/23 0500 05/04/23 0638 05/06/23 0524  Weight: 80.9 kg 78.9 kg 80.9 kg   Vitals:   05/06/23 0816 05/07/23 0816  BP: (!) 147/69 104/70  Pulse: 72 (!) 104  Resp: 16 18  Temp: 98 F (36.7 C) 98.1 F (36.7 C)  SpO2: 97% 99%   Examination: Physical Exam:  Constitutional: WN/WD overweight Caucasian female in NAD Respiratory: Mildly diminished to auscultation bilaterally, no wheezing, rales, rhonchi or crackles. Normal respiratory effort and patient is not tachypenic. No accessory muscle use. Unlabored breathing Cardiovascular: RRR, no murmurs / rubs / gallops. S1 and S2 auscultated. No extremity edema.  Abdomen: Soft, non-tender, Distended 2/2 body habitus. Bowel sounds positive.  GU: Deferred. Musculoskeletal: No clubbing / cyanosis of digits/nails. No joint deformity upper and lower extremities.   Skin: No rashes, lesions, ulcers on a limited skin evaluation. No induration; Warm and dry.  Neurologic: CN 2-12 grossly intact with no focal deficits. Romberg sign and cerebellar reflexes not assessed.   Psychiatric: Normal judgment and insight. Alert and oriented x 3. Normal mood and appropriate affect.   Condition at discharge: stable  The results of significant diagnostics from this hospitalization (including imaging, microbiology, ancillary and laboratory) are listed below for reference.   Imaging Studies: US Abdomen Limited RUQ (LIVER/GB)  Result Date: 05/06/2023 CLINICAL DATA:  Abnormal LFTs EXAM: ULTRASOUND ABDOMEN LIMITED RIGHT UPPER QUADRANT COMPARISON:  CT renal stone protocol May 01, 2023 FINDINGS: Gallbladder: Surgically absent. Common bile duct: Diameter: 4 mm Liver: No focal lesion identified. Within normal limits in parenchymal echogenicity. Portal vein is patent on color Doppler imaging with normal direction of blood flow towards the liver. Other: Incidentally visualized small right pleural effusion. IMPRESSION: 1. No etiology for elevated LFTs identified. 2. Incidentally visualized small right pleural effusion. Electronically Signed   By: Jacob Moores M.D.   On: 05/06/2023 13:14   MR SACRUM SI JOINTS WO CONTRAST  Result Date: 05/03/2023 CLINICAL DATA:  Initial evaluation for lumbar radiculopathy, low back pain, infection suspected. EXAM: MRI LUMBAR SPINE WITHOUT CONTRAST MRI SACRUM WITHOUT CONTRAST TECHNIQUE: Multiplanar, multisequence MR imaging of the lumbar spine was performed. No intravenous contrast was administered. COMPARISON:  CT from 05/01/2023. FINDINGS: MRI LUMBAR SPINE: Segmentation: Standard. Lowest well-formed disc space labeled the L5-S1 level. Alignment: 4 mm anterolisthesis of L4 on L5, with trace 2 mm anterolisthesis of L5 on S1. Findings chronic and facet mediated. Alignment otherwise normal preservation of the normal lumbar lordosis. Vertebrae: Vertebral body height maintained without acute or chronic fracture. Bone marrow signal intensity within normal limits. Few scattered subcentimeter benign hemangiomata noted. No worrisome osseous lesions. No evidence for  osteomyelitis discitis or septic arthritis. Conus medullaris and cauda equina: Conus extends to the T12-L1 level. Conus and cauda equina appear normal. Paraspinal and other soft tissues: Mild diffuse edema present within the lower posterior paraspinous musculature, extending inferiorly towards the sacrum. Overall, changes are slightly worse  on the left (series 6, images 12, 2). Mild edema within the overlying subcutaneous fat. Findings are nonspecific. No loculated soft tissue collections. Mild bilateral perinephric stranding again noted. Disc levels: L1-2: N disc desiccation without significant disc bulge. Mild facet hypertrophy. No stenosis. L2-3: Disc desiccation with minimal left eccentric disc bulge. Mild to moderate facet hypertrophy. No spinal stenosis. Foramina remain patent. L3-4: Disc desiccation with minimal annular disc bulge. Moderate facet hypertrophy. Superimposed 4 mm synovial cyst at the anterior margin of the right L3-4 facet. Resultant mild spinal stenosis. Mild bilateral L3 foraminal narrowing. L4-5: 4 mm anterolisthesis. Disc desiccation with mild disc bulge. Left extraforaminal annular fissure noted. Severe bilateral facet arthrosis. Tiny probable 5 mm synovial cyst noted at the anterior margin of the right L4-5 facet at the level of the neural foramen (series 9, image 29). Resultant moderate spinal stenosis. Mild to moderate bilateral L4 foraminal narrowing. L5-S1: Trace anterolisthesis. Disc desiccation with mild disc bulge. Mild reactive endplate spurring. Mild to moderate facet hypertrophy. Resultant mild narrowing of the right lateral recess. Central canal remains patent. No significant foraminal stenosis. MRI SACRUM: Bone marrow signal intensity within normal limits. Few scattered subcentimeter benign hemangiomata noted. No worrisome osseous lesions. Sacroiliac joints demonstrate a normal and symmetric appearance. No juxta-articular edema or erosive changes to suggest sacroiliitis. No  findings of septic arthritis. Diffuse soft tissue edema present within the left greater than right lower posterior paraspinous musculature (series 6, image 5). Mild edema within the gluteal musculature noted as well. Diffuse edema noted within the overlying subcutaneous fat. No loculated collections. Moderate volume free fluid noted within the pelvis, nonspecific. 1.3 cm subserosal fibroid present at the anterior uterine body. Small nabothian cyst noted at the cervix. Remainder of the visualized pelvic viscera demonstrate no acute or significant finding. Major vascular flow voids are maintained. No pathologically enlarged lymph nodes. IMPRESSION: 1. Diffuse intramuscular edema involving the lower posterior paraspinous musculature, left greater than right. Findings are nonspecific, but could reflect changes of acute myositis, which could be either infectious or inflammatory in nature. Changes of muscular injury/strain would be the primary differential consideration. No loculated collections. 2. No other evidence for acute infection within the lumbar spine or sacrum. 3. 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 4. Additional moderately advanced lower lumbar facet hypertrophy, which could contribute to lower back pain. 5. Moderate volume free fluid within the pelvis, nonspecific. 6. Fibroid uterus. Electronically Signed   By: Rise Mu M.D.   On: 05/03/2023 20:18   MR LUMBAR SPINE WO CONTRAST  Result Date: 05/03/2023 CLINICAL DATA:  Initial evaluation for lumbar radiculopathy, low back pain, infection suspected. EXAM: MRI LUMBAR SPINE WITHOUT CONTRAST MRI SACRUM WITHOUT CONTRAST TECHNIQUE: Multiplanar, multisequence MR imaging of the lumbar spine was performed. No intravenous contrast was administered. COMPARISON:  CT from 05/01/2023. FINDINGS: MRI LUMBAR SPINE: Segmentation: Standard. Lowest well-formed  disc space labeled the L5-S1 level. Alignment: 4 mm anterolisthesis of L4 on L5, with trace 2 mm anterolisthesis of L5 on S1. Findings chronic and facet mediated. Alignment otherwise normal preservation of the normal lumbar lordosis. Vertebrae: Vertebral body height maintained without acute or chronic fracture. Bone marrow signal intensity within normal limits. Few scattered subcentimeter benign hemangiomata noted. No worrisome osseous lesions. No evidence for osteomyelitis discitis or septic arthritis. Conus medullaris and cauda equina: Conus extends to the T12-L1 level. Conus and cauda equina appear normal. Paraspinal and other soft tissues: Mild diffuse edema  present within the lower posterior paraspinous musculature, extending inferiorly towards the sacrum. Overall, changes are slightly worse on the left (series 6, images 12, 2). Mild edema within the overlying subcutaneous fat. Findings are nonspecific. No loculated soft tissue collections. Mild bilateral perinephric stranding again noted. Disc levels: L1-2: N disc desiccation without significant disc bulge. Mild facet hypertrophy. No stenosis. L2-3: Disc desiccation with minimal left eccentric disc bulge. Mild to moderate facet hypertrophy. No spinal stenosis. Foramina remain patent. L3-4: Disc desiccation with minimal annular disc bulge. Moderate facet hypertrophy. Superimposed 4 mm synovial cyst at the anterior margin of the right L3-4 facet. Resultant mild spinal stenosis. Mild bilateral L3 foraminal narrowing. L4-5: 4 mm anterolisthesis. Disc desiccation with mild disc bulge. Left extraforaminal annular fissure noted. Severe bilateral facet arthrosis. Tiny probable 5 mm synovial cyst noted at the anterior margin of the right L4-5 facet at the level of the neural foramen (series 9, image 29). Resultant moderate spinal stenosis. Mild to moderate bilateral L4 foraminal narrowing. L5-S1: Trace anterolisthesis. Disc desiccation with mild disc bulge. Mild  reactive endplate spurring. Mild to moderate facet hypertrophy. Resultant mild narrowing of the right lateral recess. Central canal remains patent. No significant foraminal stenosis. MRI SACRUM: Bone marrow signal intensity within normal limits. Few scattered subcentimeter benign hemangiomata noted. No worrisome osseous lesions. Sacroiliac joints demonstrate a normal and symmetric appearance. No juxta-articular edema or erosive changes to suggest sacroiliitis. No findings of septic arthritis. Diffuse soft tissue edema present within the left greater than right lower posterior paraspinous musculature (series 6, image 5). Mild edema within the gluteal musculature noted as well. Diffuse edema noted within the overlying subcutaneous fat. No loculated collections. Moderate volume free fluid noted within the pelvis, nonspecific. 1.3 cm subserosal fibroid present at the anterior uterine body. Small nabothian cyst noted at the cervix. Remainder of the visualized pelvic viscera demonstrate no acute or significant finding. Major vascular flow voids are maintained. No pathologically enlarged lymph nodes. IMPRESSION: 1. Diffuse intramuscular edema involving the lower posterior paraspinous musculature, left greater than right. Findings are nonspecific, but could reflect changes of acute myositis, which could be either infectious or inflammatory in nature. Changes of muscular injury/strain would be the primary differential consideration. No loculated collections. 2. No other evidence for acute infection within the lumbar spine or sacrum. 3. 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 4. Additional moderately advanced lower lumbar facet hypertrophy, which could contribute to lower back pain. 5. Moderate volume free fluid within the pelvis, nonspecific. 6. Fibroid uterus. Electronically Signed   By: Rise Mu M.D.   On:  05/03/2023 20:18   CT Renal Stone Study  Result Date: 05/01/2023 CLINICAL DATA:  Abdominal/flank pain, stone suspected. Right buttock pain radiating down the right leg. Fatigue and nausea. EXAM: CT ABDOMEN AND PELVIS WITHOUT CONTRAST TECHNIQUE: Multidetector CT imaging of the abdomen and pelvis was performed following the standard protocol without IV contrast. RADIATION DOSE REDUCTION: This exam was performed according to the departmental dose-optimization program which includes automated exposure control, adjustment of the mA and/or kV according to patient size and/or use of iterative reconstruction technique. COMPARISON:  CT abdomen and pelvis 05/10/2010 FINDINGS: Lower chest: No acute abnormality. Hepatobiliary: No focal liver abnormality is seen. Status post cholecystectomy. No biliary dilatation. Pancreas: Unremarkable. Spleen: Unremarkable. Adrenals/Urinary Tract: Unremarkable adrenal glands. No renal calculi or hydronephrosis. New moderate bilateral perinephric stranding. Unremarkable bladder. Stomach/Bowel: There is a small sliding hiatal hernia. There  is no evidence of bowel obstruction or inflammation. The appendix is unremarkable. Vascular/Lymphatic: Mild abdominal aortic atherosclerosis without aneurysm. No enlarged lymph nodes. Reproductive: Uterus and bilateral adnexa are unremarkable. Other: No ascites or pneumoperitoneum. Musculoskeletal: No suspicious osseous lesion. Advanced lower lumbar facet arthrosis with grade 1 anterolisthesis at L4-5. IMPRESSION: 1. Nonspecific bilateral perinephric stranding. Correlate for signs of urinary tract infection. 2. No urinary tract calculi or hydronephrosis. 3.  Aortic Atherosclerosis (ICD10-I70.0). Electronically Signed   By: Sebastian Ache M.D.   On: 05/01/2023 20:03    Microbiology: Results for orders placed or performed during the hospital encounter of 05/01/23  Urine Culture     Status: Abnormal   Collection Time: 05/01/23  8:49 PM   Specimen: Urine,  Clean Catch  Result Value Ref Range Status   Specimen Description   Final    URINE, CLEAN CATCH Performed at [facility], [address] [place], [place] [address]    Special Requests   Final    NONE Performed at [facility], [address], [place], [place] [address]    Culture 40,000 COLONIES/mL ESCHERICHIA COLI (A)  Final   Report Status 05/04/2023 FINAL  Final   Organism ID, Bacteria ESCHERICHIA COLI (A)  Final      Susceptibility   Escherichia coli - MIC*    AMPICILLIN >=32 RESISTANT Resistant     CEFAZOLIN <=4 SENSITIVE Sensitive     CEFEPIME <=0.12 SENSITIVE Sensitive     CEFTRIAXONE <=0.25 SENSITIVE Sensitive     CIPROFLOXACIN <=0.25 SENSITIVE Sensitive     GENTAMICIN <=1 SENSITIVE Sensitive     IMIPENEM <=0.25 SENSITIVE Sensitive     NITROFURANTOIN <=16 SENSITIVE Sensitive     TRIMETH/SULFA >=320 RESISTANT Resistant     AMPICILLIN/SULBACTAM 4 SENSITIVE Sensitive     PIP/TAZO <=4 SENSITIVE Sensitive     * 40,000 COLONIES/mL ESCHERICHIA COLI  Blood culture (routine x 2)     Status: Abnormal   Collection Time: 05/01/23  9:15 PM   Specimen: BLOOD  Result Value Ref Range Status   Specimen Description BLOOD RIGHT ANTECUBITAL  Final   Special Requests   Final    BOTTLES DRAWN AEROBIC AND ANAEROBIC Blood Culture adequate volume   Culture  Setup Time   Final    GRAM NEGATIVE RODS IN BOTH AEROBIC AND ANAEROBIC BOTTLES CRITICAL RESULT CALLED TO, READ BACK BY AND VERIFIED WITH: PHARMD JIMMY WYLAND ON 05/02/23 @ 1845 BY DRT    Culture (A)  Final    ESCHERICHIA COLI SUSCEPTIBILITIES PERFORMED ON PREVIOUS CULTURE WITHIN THE LAST 5 DAYS.    Report Status 05/04/2023 FINAL  Final  Blood culture (routine x 2)     Status: Abnormal   Collection Time: 05/01/23  9:30 PM   Specimen: BLOOD  Result Value Ref Range Status   Specimen Description   Final    BLOOD LEFT ANTECUBITAL Performed at [facility], [address], [place], [place] [address]    Special Requests   Final    BOTTLES DRAWN AEROBIC AND ANAEROBIC Blood Culture adequate volume Performed at [facility], [address], [place], [place] [address]    Culture  Setup Time   Final    GRAM NEGATIVE RODS IN BOTH AEROBIC AND ANAEROBIC BOTTLES CRITICAL RESULT CALLED TO, READ BACK BY AND VERIFIED WITH: PHARMD JIMMY WYLAND ON 05/02/23 @ 1845 BY DRT Performed at [facility], [address], [place], [place] [address]    Culture ESCHERICHIA COLI (A)  Final   Report Status 05/04/2023 FINAL  Final   Organism ID, Bacteria ESCHERICHIA COLI  Final      Susceptibility   Escherichia coli - MIC*    AMPICILLIN >=32 RESISTANT Resistant     CEFEPIME <=0.12 SENSITIVE Sensitive     CEFTAZIDIME <=1 SENSITIVE Sensitive     CEFTRIAXONE <=0.25 SENSITIVE Sensitive     CIPROFLOXACIN <=0.25 SENSITIVE Sensitive     GENTAMICIN <=1 SENSITIVE Sensitive     IMIPENEM <=0.25 SENSITIVE Sensitive     TRIMETH/SULFA >=320 RESISTANT Resistant     AMPICILLIN/SULBACTAM 8 SENSITIVE Sensitive     PIP/TAZO <=4 SENSITIVE Sensitive     * ESCHERICHIA COLI  Blood Culture ID Panel (Reflexed)     Status: Abnormal   Collection Time: 05/01/23  9:30 PM  Result Value Ref Range Status   Enterococcus faecalis NOT DETECTED NOT DETECTED Final   Enterococcus Faecium NOT DETECTED NOT DETECTED Final   Listeria monocytogenes NOT DETECTED NOT DETECTED Final   Staphylococcus species NOT DETECTED NOT DETECTED Final   Staphylococcus aureus (BCID) NOT DETECTED NOT DETECTED Final   Staphylococcus epidermidis NOT DETECTED NOT DETECTED Final   Staphylococcus lugdunensis NOT DETECTED NOT DETECTED Final   Streptococcus species NOT DETECTED NOT DETECTED Final   Streptococcus agalactiae NOT DETECTED NOT DETECTED Final   Streptococcus pneumoniae NOT DETECTED NOT DETECTED Final   Streptococcus pyogenes NOT DETECTED NOT DETECTED Final   A.calcoaceticus-baumannii NOT DETECTED NOT  DETECTED Final   Bacteroides fragilis NOT DETECTED NOT DETECTED Final   Enterobacterales DETECTED (A) NOT DETECTED Final    Comment: Enterobacterales represent a large order of gram negative bacteria, not a single organism. CRITICAL RESULT CALLED TO, READ BACK BY AND VERIFIED WITH: PHARMD JIMMY WYLAND ON 05/02/23 @ 1845 BY DRT    Enterobacter cloacae complex NOT DETECTED NOT DETECTED Final   Escherichia coli DETECTED (A) NOT DETECTED Final    Comment: CRITICAL RESULT CALLED TO, READ BACK BY AND VERIFIED WITH: PHARMD JIMMY WYLAND ON 05/02/23 @ 1845 BY DRT    Klebsiella aerogenes NOT DETECTED NOT DETECTED Final   Klebsiella oxytoca NOT DETECTED NOT DETECTED Final   Klebsiella pneumoniae NOT DETECTED NOT DETECTED Final   Proteus species NOT DETECTED NOT DETECTED Final   Salmonella species NOT DETECTED NOT DETECTED Final   Serratia marcescens NOT DETECTED NOT DETECTED Final   Haemophilus influenzae NOT DETECTED NOT DETECTED Final   Neisseria meningitidis NOT DETECTED NOT DETECTED Final   Pseudomonas aeruginosa NOT DETECTED NOT DETECTED Final   Stenotrophomonas maltophilia NOT DETECTED NOT DETECTED Final   Candida albicans NOT DETECTED NOT DETECTED Final   Candida auris NOT DETECTED NOT DETECTED Final   Candida glabrata NOT DETECTED NOT DETECTED Final   Candida krusei NOT DETECTED NOT DETECTED Final   Candida parapsilosis NOT DETECTED NOT DETECTED Final   Candida tropicalis NOT DETECTED NOT DETECTED Final   Cryptococcus neoformans/gattii NOT DETECTED NOT DETECTED Final   CTX-M ESBL NOT DETECTED NOT DETECTED Final   Carbapenem resistance IMP NOT DETECTED NOT DETECTED Final   Carbapenem resistance KPC NOT DETECTED NOT DETECTED Final   Carbapenem resistance NDM NOT DETECTED NOT DETECTED Final   Carbapenem resist OXA 48 LIKE NOT DETECTED NOT DETECTED Final   Carbapenem resistance VIM NOT DETECTED NOT DETECTED Final    Comment: Performed at [facility], [address] [place],  [place] [address]  Urine Culture     Status: None   Collection Time: 05/04/23  1:00 AM   Specimen:  Urine, Clean Catch  Result Value Ref Range Status   Specimen Description URINE, CLEAN CATCH  Final   Special Requests NONE  Final   Culture   Final    NO GROWTH Performed at [facility], [address] [place], [place] [address]    Report Status 05/05/2023 FINAL  Final  Culture, blood (Routine X 2) w Reflex to ID Panel     Status: None (Preliminary result)   Collection Time: 05/04/23  4:40 PM   Specimen: BLOOD  Result Value Ref Range Status   Specimen Description BLOOD RIGHT ANTECUBITAL  Final   Special Requests   Final    BOTTLES DRAWN AEROBIC AND ANAEROBIC Blood Culture adequate volume   Culture   Final    NO GROWTH 4 DAYS Performed at [facility], [address], [place], [place] [address]    Report Status PENDING  Incomplete  Culture, blood (Routine X 2) w Reflex to ID Panel     Status: None (Preliminary result)   Collection Time: 05/04/23  4:50 PM   Specimen: BLOOD RIGHT HAND  Result Value Ref Range Status   Specimen Description BLOOD RIGHT HAND  Final   Special Requests   Final    BOTTLES DRAWN AEROBIC AND ANAEROBIC Blood Culture adequate volume   Culture   Final    NO GROWTH 4 DAYS Performed at [facility], [address], [place], [place] [address]    Report Status PENDING  Incomplete   Labs: CBC: Recent Labs  Lab 05/02/23 0129 05/03/23 0443 05/04/23 0335 05/05/23 0141 05/06/23 0121  WBC 21.4* 11.9* 7.5 9.6 8.8  NEUTROABS 18.5* 11.3* 6.1 7.3 7.4  HGB 10.3* 11.0* 9.9* 8.7* 8.7*  HCT 30.4* 33.0* 29.9* 26.1* 26.3*  MCV 83.5 85.5 85.2 86.7 87.4  PLT 129* 128* 125* 135* 159   Basic Metabolic Panel: Recent Labs  Lab 05/02/23 0129 05/03/23 0443 05/04/23 0335 05/05/23 0141 05/06/23 0121  NA 131* 130* 135 135 137  K 3.2* 4.2 3.9 3.7 3.5  CL 95* 102 105 105 109  CO2 21* 20* 21* 19* 20*  GLUCOSE 146* 128* 113* 125* 135*  BUN 29* 28* 24* 19 16   CREATININE 2.06* 1.99* 1.75* 1.49* 1.23*  CALCIUM 8.1* 7.9* 8.1* 8.0* 7.9*  MG 1.3* 2.3 2.3 2.0 1.9  PHOS  --  2.0* 3.0 2.8 3.3   Liver Function Tests: Recent Labs  Lab 05/02/23 0129 05/03/23 0443 05/04/23 0335 05/05/23 0141 05/06/23 0121  AST 21 37 19 58* 65*  ALT 31 47* 40 76* 105*  ALKPHOS 58 88 93 129* 164*  BILITOT 0.9 0.7 0.5 0.3 0.6  PROT 5.5* 6.0* 5.5* 5.1* 5.3*  ALBUMIN 2.7* 2.5* 2.3* 2.2* 2.3*   CBG: Recent Labs  Lab 05/05/23 1138 05/05/23 1701 05/05/23 2033 05/06/23 0815 05/06/23 1211  GLUCAP 171* 114* 131* 118* 115*   Discharge time spent: greater than 30 minutes.  Signed: Marguerita Merles, DO [facility] 05/08/2023

## 2023-05-06 NOTE — Plan of Care (Signed)
  Problem: Education: Goal: Knowledge of General Education information will improve Description: Including pain rating scale, medication(s)/side effects and non-pharmacologic comfort measures Outcome: Adequate for Discharge   Problem: Health Behavior/Discharge Planning: Goal: Ability to manage health-related needs will improve Outcome: Adequate for Discharge   Problem: Clinical Measurements: Goal: Ability to maintain clinical measurements within normal limits will improve Outcome: Adequate for Discharge Goal: Will remain free from infection Outcome: Adequate for Discharge Goal: Diagnostic test results will improve Outcome: Adequate for Discharge Goal: Respiratory complications will improve Outcome: Adequate for Discharge Goal: Cardiovascular complication will be avoided Outcome: Adequate for Discharge   Problem: Activity: Goal: Risk for activity intolerance will decrease Outcome: Adequate for Discharge   Problem: Nutrition: Goal: Adequate nutrition will be maintained Outcome: Adequate for Discharge   Problem: Coping: Goal: Level of anxiety will decrease Outcome: Adequate for Discharge   Problem: Elimination: Goal: Will not experience complications related to bowel motility Outcome: Adequate for Discharge Goal: Will not experience complications related to urinary retention Outcome: Adequate for Discharge   Problem: Pain Managment: Goal: General experience of comfort will improve Outcome: Adequate for Discharge   Problem: Safety: Goal: Ability to remain free from injury will improve Outcome: Adequate for Discharge   Problem: Skin Integrity: Goal: Risk for impaired skin integrity will decrease Outcome: Adequate for Discharge   Problem: Fluid Volume: Goal: Hemodynamic stability will improve Outcome: Adequate for Discharge   Problem: Clinical Measurements: Goal: Diagnostic test results will improve Outcome: Adequate for Discharge Goal: Signs and symptoms of  infection will decrease Outcome: Adequate for Discharge   Problem: Respiratory: Goal: Ability to maintain adequate ventilation will improve Outcome: Adequate for Discharge   Problem: Education: Goal: Ability to describe self-care measures that may prevent or decrease complications (Diabetes Survival Skills Education) will improve Outcome: Adequate for Discharge Goal: Individualized Educational Video(s) Outcome: Adequate for Discharge   Problem: Coping: Goal: Ability to adjust to condition or change in health will improve Outcome: Adequate for Discharge   Problem: Fluid Volume: Goal: Ability to maintain a balanced intake and output will improve Outcome: Adequate for Discharge   Problem: Health Behavior/Discharge Planning: Goal: Ability to identify and utilize available resources and services will improve Outcome: Adequate for Discharge Goal: Ability to manage health-related needs will improve Outcome: Adequate for Discharge   Problem: Metabolic: Goal: Ability to maintain appropriate glucose levels will improve Outcome: Adequate for Discharge   Problem: Nutritional: Goal: Maintenance of adequate nutrition will improve Outcome: Adequate for Discharge Goal: Progress toward achieving an optimal weight will improve Outcome: Adequate for Discharge   Problem: Skin Integrity: Goal: Risk for impaired skin integrity will decrease Outcome: Adequate for Discharge   Problem: Tissue Perfusion: Goal: Adequacy of tissue perfusion will improve Outcome: Adequate for Discharge   

## 2023-05-06 NOTE — Progress Notes (Signed)
Discharge instructions (including medications) discussed with and copy provided to patient/caregiver 

## 2023-05-07 ENCOUNTER — Telehealth: Payer: Self-pay

## 2023-05-07 LAB — CULTURE, BLOOD (ROUTINE X 2)
Culture: NO GROWTH
Culture: NO GROWTH

## 2023-05-07 NOTE — Transitions of Care (Post Inpatient/ED Visit) (Signed)
   05/07/2023  Name: Tracy Anderson MRN: [id number] DOB: 02/13/1961  Today's TOC FU Call Status: Today's TOC FU Call Status:: Unsuccessul Call (1st Attempt) Unsuccessful Call (1st Attempt) Date: 05/07/23  Attempted to reach the patient regarding the most recent Inpatient/ED visit.  Follow Up Plan: Additional outreach attempts will be made to reach the patient to complete the Transitions of Care (Post Inpatient/ED visit) call.   Signature   Woodfin Ganja LPN [facility] Nurse Health Advisor Direct Dial [phone]

## 2023-05-08 ENCOUNTER — Encounter: Payer: Self-pay | Admitting: Family Medicine

## 2023-05-08 ENCOUNTER — Ambulatory Visit (INDEPENDENT_AMBULATORY_CARE_PROVIDER_SITE_OTHER): Payer: No Typology Code available for payment source | Admitting: Family Medicine

## 2023-05-08 VITALS — BP 124/80 | HR 77 | Temp 98.1°F | Wt 174.4 lb

## 2023-05-08 DIAGNOSIS — M5442 Lumbago with sciatica, left side: Secondary | ICD-10-CM | POA: Diagnosis not present

## 2023-05-08 DIAGNOSIS — G8929 Other chronic pain: Secondary | ICD-10-CM

## 2023-05-08 DIAGNOSIS — N12 Tubulo-interstitial nephritis, not specified as acute or chronic: Secondary | ICD-10-CM

## 2023-05-08 DIAGNOSIS — M545 Low back pain, unspecified: Secondary | ICD-10-CM | POA: Insufficient documentation

## 2023-05-08 LAB — CULTURE, BLOOD (ROUTINE X 2)

## 2023-05-08 NOTE — Progress Notes (Signed)
   Subjective:    Patient ID: Tracy Anderson, female    DOB: 08-20-1961, 62 y.o.   MRN: [id number]  HPI Here for a transitional care visit to follow up on a hospital stay from 05-01-23 to 05-06-23 for pyelonephritis. She presented with fever to 103 degrees, left sided low back pain and left flank pain, and nausea without vomiting. She was treated with IV Rocephin. A urine culture grew E coli that was sensitive to cephalosporins. Blood cultures remained negative. A CT scan was negative for stones or hydronephrosis, but perinephric stranding was seen in both kidneys. Her initial WBC was 28.5, and her initial creatinine was 2.06. They also did a lumbar spine MRI because she was complaining of left lower back pain that radiated down the left leg. This revealed severe facet arthrosis at L4-5 as well as moderate spinal stenosis. She was started on Gabapentin for this. By her DC the WBC was down to 8.8, and the creatinine was down to 1.23. She was sent home with 8 days of Cefadroxil to finish. Today she feels much better but she is still a little weak. No fever or nausea. Her appetite is coming back slowly. She is drinking lots of water. The other recent problem is worsening low back pain. This started several years ago, and it is getting worse. Most of the pain is in the left lower back, and it radiates down the left leg. She also has some numbness in the left leg. She had a lumbar spine MRI showing moderate spinal stenosis and severe facet arthrosis at the L4-5 level. In addition to Ibuprofen, she was started on Gabapentin.    Review of Systems  Constitutional:  Positive for fatigue. Negative for fever.  Respiratory: Negative.    Cardiovascular: Negative.   Gastrointestinal: Negative.   Genitourinary: Negative.   Musculoskeletal:  Positive for back pain.       Objective:   Physical Exam Constitutional:      Appearance: Normal appearance. She is not ill-appearing.  Cardiovascular:     Rate  and Rhythm: Normal rate and regular rhythm.     Pulses: Normal pulses.     Heart sounds: Normal heart sounds.  Pulmonary:     Effort: Pulmonary effort is normal.     Breath sounds: Normal breath sounds.  Abdominal:     General: Abdomen is flat. Bowel sounds are normal. There is no distension.     Palpations: Abdomen is soft. There is no mass.     Tenderness: There is no abdominal tenderness. There is no right CVA tenderness, left CVA tenderness, guarding or rebound.     Hernia: No hernia is present.  Musculoskeletal:     Comments: She is tender in the left lower back and the left sciatic notch. Spine has full ROM  Neurological:     Mental Status: She is alert.           Assessment & Plan:  She is recovering from a pyelonephritis, and she will finish up the Cefadroxil. She will come by out lab in 3 days to check a BMET and a CBC. She also has low back pain with sciatica, so we will refer her to Neurosurgery to evaluate. We spent a total of ( 33  ) minutes reviewing records and discussing these issues.  Gershon Crane, MD

## 2023-05-08 NOTE — Telephone Encounter (Signed)
We saw her OV today  ?

## 2023-05-08 NOTE — Transitions of Care (Post Inpatient/ED Visit) (Signed)
05/08/2023  Name: Tracy Anderson MRN: [id number] DOB: [birth year]  Today's TOC FU Call Status: Today's TOC FU Call Status:: Successful TOC FU Call Competed Unsuccessful Call (1st Attempt) Date: 05/07/23 [facility] FU Call Complete Date: 05/08/23  Transition Care Management Follow-up Telephone Call Date of Discharge: 05/06/23 Discharge Facility: [facility]) Type of Discharge: Inpatient Admission Primary Inpatient Discharge Diagnosis:: Pyelonephritis How have you been since you were released from the hospital?: Better Any questions or concerns?: No  Items Reviewed: Did you receive and understand the discharge instructions provided?: Yes Medications obtained,verified, and reconciled?: Yes (Medications Reviewed) Any new allergies since your discharge?: No Dietary orders reviewed?: Yes Do you have support at home?: Yes  Medications Reviewed Today: Medications Reviewed Today     Reviewed by Merleen Nicely, LPN (Licensed Practical Nurse) on 05/08/23 at [phone]  Med List Status: <None>   Medication Order Taking? Sig Documenting Provider Last Dose Status Informant  atorvastatin (LIPITOR) 10 MG tablet [id number] No Take 1 tablet (10 mg total) by mouth daily. Have PCP check LFTs prior to Reinitiating  Patient not taking: Reported on 05/08/2023   Merlene Laughter, DO Not Taking Active   buPROPion (WELLBUTRIN XL) 150 MG 24 hr tablet [id number] Yes Take 1 tablet (150 mg total) by mouth daily. Nelwyn Salisbury, MD Taking Active Self, Pharmacy Records  cefadroxil (DURICEF) 500 MG capsule [id number] Yes Take 2 capsules (1,000 mg total) by mouth 2 (two) times daily for 8 days. Marguerita Merles [place], [place] Taking Active   COD LIVER OIL PO [id number] Yes Take 1 capsule by mouth daily. [provider] Taking Active Self, Pharmacy Records  diclofenac Sodium (VOLTAREN) 1 % GEL [id number] Yes Apply 2 g topically 4 (four) times daily. Marguerita Merles [place], DO Taking Active   fluconazole (DIFLUCAN)  150 MG tablet [id number] Yes Take Pill if Necessary for Yeast Infection caused by Antibiotics and follow up with PCP for further treatment Merlene Laughter, DO Taking Active   gabapentin (NEURONTIN) 100 MG capsule [id number] Yes Take 2 capsules (200 mg total) by mouth 3 (three) times daily for 14 days. Marguerita Merles [place], DO Taking Active   glucose blood (FREESTYLE LITE) test strip [id number] Yes Use to check blood sugar 3 times a day Nelwyn Salisbury, MD Taking Active Self, Pharmacy Records  Lancets (FREESTYLE) lancets [id number] Yes Use to check blood sugar 3 times a day Nelwyn Salisbury, MD Taking Active Self, Pharmacy Records  lidocaine (LIDODERM) 5 % [id number] Yes Place 1 patch onto the skin daily. Remove & Discard patch within 12 hours or as directed by MD Merlene Laughter, DO Taking Active   Multiple Vitamins-Minerals (MULTIVITAMIN WITH MINERALS) tablet [id number] Yes Take 1 tablet by mouth daily. [provider] Taking Active Self, Pharmacy Records  ondansetron (ZOFRAN) 4 MG tablet [id number] No Take 1 tablet (4 mg total) by mouth every 6 (six) hours as needed for nausea.  Patient not taking: Reported on 05/08/2023   Merlene Laughter, DO Not Taking Active   oxyCODONE (OXY IR/ROXICODONE) 5 MG immediate release tablet [id number] No Take 5-10 mg by mouth every 6 (six) hours as needed for severe pain.  Patient not taking: Reported on 05/08/2023   [provider] Not Taking Active Self, Pharmacy Records  polyethylene glycol (MIRALAX / GLYCOLAX) 17 g packet [id number] No Take 17 g by mouth daily as needed for mild constipation.  Patient not taking: Reported on 05/08/2023   Merlene Laughter, DO Not Taking Active  sodium bicarbonate 650 MG tablet [id number] Yes Take 1 tablet (650 mg total) by mouth 2 (two) times daily for 3 days. Sheikh, Omair Wakeman, DO Taking Active   tirzepatide [facility]) 12.5 MG/0.5ML Pen [id number] No Inject 12.5 mg into the skin once a week.  Patient not taking:  Reported on 05/08/2023   Merlene Laughter, DO Not Taking Active   Vitamin D, Ergocalciferol, (DRISDOL) 1.25 MG (50000 UNIT) CAPS capsule [id number] Yes Take 1 capsule (50,000 Units total) by mouth every 7 (seven) days. Nelwyn Salisbury, MD Taking Active Self, Pharmacy Records            Home Care and Equipment/Supplies: Were Home Health Services Ordered?: No Any new equipment or medical supplies ordered?: No  Functional Questionnaire: Do you need assistance with bathing/showering or dressing?: No Do you need assistance with meal preparation?: No Do you need assistance with eating?: No Do you have difficulty maintaining continence: No Do you need assistance with getting out of bed/getting out of a chair/moving?: No Do you have difficulty managing or taking your medications?: No  Follow up appointments reviewed: PCP Follow-up appointment confirmed?: Yes Date of PCP follow-up appointment?: 05/08/23 Follow-up Provider: Dr Clent Ridges [facility] Follow-up appointment confirmed?: No Do you need transportation to your follow-up appointment?: No Do you understand care options if your condition(s) worsen?: Yes-patient verbalized understanding    SIGNATURE  Woodfin Ganja LPN [facility] Nurse Health Advisor Direct Dial [phone]

## 2023-05-09 LAB — CULTURE, BLOOD (ROUTINE X 2): Special Requests: ADEQUATE

## 2023-05-11 ENCOUNTER — Encounter: Payer: Self-pay | Admitting: Family Medicine

## 2023-05-11 ENCOUNTER — Other Ambulatory Visit (INDEPENDENT_AMBULATORY_CARE_PROVIDER_SITE_OTHER): Payer: No Typology Code available for payment source

## 2023-05-11 DIAGNOSIS — N12 Tubulo-interstitial nephritis, not specified as acute or chronic: Secondary | ICD-10-CM | POA: Diagnosis not present

## 2023-05-11 LAB — BASIC METABOLIC PANEL
BUN: 18 mg/dL (ref 6–23)
CO2: 30 mEq/L (ref 19–32)
Calcium: 9.5 mg/dL (ref 8.4–10.5)
Chloride: 98 mEq/L (ref 96–112)
Creatinine, Ser: 1.19 mg/dL (ref 0.40–1.20)
GFR: 48.45 mL/min — ABNORMAL LOW (ref >60.00)
Glucose, Bld: 138 mg/dL — ABNORMAL HIGH (ref 70–99)
Potassium: 4.6 mEq/L (ref 3.5–5.1)
Sodium: 137 mEq/L (ref 135–145)

## 2023-05-11 LAB — CBC WITH DIFFERENTIAL/PLATELET
Basophils Absolute: 0 10*3/uL (ref 0.0–0.1)
Basophils Relative: 0.2 % (ref 0.0–3.0)
Eosinophils Absolute: 0.1 10*3/uL (ref 0.0–0.7)
Eosinophils Relative: 0.6 % (ref 0.0–5.0)
HCT: 31.2 % — ABNORMAL LOW (ref 36.0–46.0)
Hemoglobin: 10 g/dL — ABNORMAL LOW (ref 12.0–15.0)
Lymphocytes Relative: 11.7 % — ABNORMAL LOW (ref 12.0–46.0)
Lymphs Abs: 1.4 10*3/uL (ref 0.7–4.0)
MCHC: 32.1 g/dL (ref 30.0–36.0)
MCV: 87 fl (ref 78.0–100.0)
Monocytes Absolute: 0.6 10*3/uL (ref 0.1–1.0)
Monocytes Relative: 4.7 % (ref 3.0–12.0)
Neutro Abs: 9.9 10*3/uL — ABNORMAL HIGH (ref 1.4–7.7)
Neutrophils Relative %: 82.8 % — ABNORMAL HIGH (ref 43.0–77.0)
Platelets: 408 10*3/uL — ABNORMAL HIGH (ref 150.0–400.0)
RBC: 3.59 Mil/uL — ABNORMAL LOW (ref 3.87–5.11)
RDW: 14.3 % (ref 11.5–15.5)
WBC: 12 10*3/uL — ABNORMAL HIGH (ref 4.0–10.5)

## 2023-05-14 ENCOUNTER — Encounter: Payer: Self-pay | Admitting: Family Medicine

## 2023-05-14 ENCOUNTER — Other Ambulatory Visit (HOSPITAL_COMMUNITY): Payer: Self-pay

## 2023-05-14 ENCOUNTER — Other Ambulatory Visit: Payer: Self-pay | Admitting: Family Medicine

## 2023-05-14 ENCOUNTER — Other Ambulatory Visit: Payer: Self-pay

## 2023-05-14 MED ORDER — CIPROFLOXACIN HCL 500 MG PO TABS
500.0000 mg | ORAL_TABLET | Freq: Two times a day (BID) | ORAL | 0 refills | Status: AC
  Filled 2023-05-14 (×2): qty 14, 7d supply, fill #0

## 2023-05-15 NOTE — Telephone Encounter (Signed)
Her WBC count is high because her body is fighting the kidney infection. This is also the likely cause for her anemia. These should be transient. We will plan to repeat a CBC in a few weeks to check

## 2023-05-15 NOTE — Telephone Encounter (Signed)
See my answer to her other question

## 2023-05-16 NOTE — Telephone Encounter (Signed)
Get labs 4 weeks out

## 2023-05-18 ENCOUNTER — Other Ambulatory Visit (HOSPITAL_BASED_OUTPATIENT_CLINIC_OR_DEPARTMENT_OTHER): Payer: Self-pay

## 2023-05-18 ENCOUNTER — Telehealth: Payer: Self-pay | Admitting: Family Medicine

## 2023-05-18 ENCOUNTER — Encounter: Payer: Self-pay | Admitting: Family Medicine

## 2023-05-18 ENCOUNTER — Other Ambulatory Visit: Payer: Self-pay | Admitting: Family Medicine

## 2023-05-18 ENCOUNTER — Other Ambulatory Visit: Payer: Self-pay

## 2023-05-18 DIAGNOSIS — Z1231 Encounter for screening mammogram for malignant neoplasm of breast: Secondary | ICD-10-CM

## 2023-05-18 MED ORDER — TIRZEPATIDE 12.5 MG/0.5ML ~~LOC~~ SOAJ
12.5000 mg | SUBCUTANEOUS | 5 refills | Status: DC
  Filled 2023-05-18: qty 2, 28d supply, fill #0
  Filled 2023-06-22: qty 2, 28d supply, fill #1
  Filled 2023-07-22: qty 2, 28d supply, fill #2
  Filled 2023-08-16: qty 2, 28d supply, fill #3
  Filled 2023-09-12: qty 2, 28d supply, fill #4
  Filled 2023-10-12: qty 2, 28d supply, fill #5
  Filled 2023-11-11: qty 2, 28d supply, fill #6
  Filled 2023-12-06: qty 2, 28d supply, fill #7
  Filled 2024-01-04: qty 2, 28d supply, fill #8
  Filled 2024-02-06 (×2): qty 2, 28d supply, fill #9
  Filled 2024-02-29: qty 2, 28d supply, fill #10

## 2023-05-18 NOTE — Telephone Encounter (Signed)
 [facility] called stating they do not accept this patients insurance, patient will need to be referred elsewhere

## 2023-05-18 NOTE — Telephone Encounter (Signed)
Done

## 2023-05-20 ENCOUNTER — Other Ambulatory Visit (HOSPITAL_BASED_OUTPATIENT_CLINIC_OR_DEPARTMENT_OTHER): Payer: Self-pay

## 2023-05-21 ENCOUNTER — Encounter: Payer: Self-pay | Admitting: Family Medicine

## 2023-05-23 ENCOUNTER — Telehealth: Payer: Self-pay | Admitting: Family Medicine

## 2023-05-23 DIAGNOSIS — D72829 Elevated white blood cell count, unspecified: Secondary | ICD-10-CM

## 2023-05-23 NOTE — Telephone Encounter (Signed)
Pt called to say she believes MD wants her to come in for labs. Pt would like to know what kind of labs and when should she come in? There are currently no active lab requests in Pt's chart. Please advise.

## 2023-05-23 NOTE — Telephone Encounter (Signed)
After reviewing the chart, I see a Mychart message from 7/9 in which PCP advised to repeat a CBC in 4 weeks out from 7/10.  Lab appt was scheduled for 8/13 per patient's preference and orders were entered.

## 2023-05-29 ENCOUNTER — Other Ambulatory Visit (HOSPITAL_BASED_OUTPATIENT_CLINIC_OR_DEPARTMENT_OTHER): Payer: Self-pay

## 2023-05-29 ENCOUNTER — Other Ambulatory Visit: Payer: Self-pay | Admitting: Family Medicine

## 2023-05-29 ENCOUNTER — Other Ambulatory Visit (HOSPITAL_COMMUNITY): Payer: Self-pay

## 2023-05-31 NOTE — Telephone Encounter (Signed)
Left pt a detailed message regarding her referral to [facility]. Attempted to contact the office on behave of pt with no success, advised pt to call the phone number provided in the voice message to check on the hold up of her referral

## 2023-06-04 ENCOUNTER — Encounter: Payer: Self-pay | Admitting: Family Medicine

## 2023-06-04 ENCOUNTER — Ambulatory Visit (INDEPENDENT_AMBULATORY_CARE_PROVIDER_SITE_OTHER): Payer: No Typology Code available for payment source | Admitting: Family Medicine

## 2023-06-04 VITALS — BP 144/80 | HR 72 | Temp 98.2°F | Ht 67.0 in | Wt 163.2 lb

## 2023-06-04 DIAGNOSIS — R059 Cough, unspecified: Secondary | ICD-10-CM | POA: Diagnosis not present

## 2023-06-04 DIAGNOSIS — J069 Acute upper respiratory infection, unspecified: Secondary | ICD-10-CM | POA: Diagnosis not present

## 2023-06-04 DIAGNOSIS — M67431 Ganglion, right wrist: Secondary | ICD-10-CM | POA: Insufficient documentation

## 2023-06-04 LAB — POC COVID19 BINAXNOW: SARS Coronavirus 2 Ag: NEGATIVE

## 2023-06-04 NOTE — Progress Notes (Signed)
   Subjective:    Patient ID: Tracy Anderson, female    DOB: 10-14-1961, 62 y.o.   MRN: [id number]  HPI Here for 2 issues. First she has had 3 days of hoarseness and a dry cough. No ST or headache or body aches. No fever or SOB. Also about 6 months ago a lump appeared on her right wrist that has persisted since then. It is not painful. No hx of trauma.    Review of Systems  Constitutional: Negative.   HENT:  Positive for voice change. Negative for congestion, ear pain, postnasal drip, sinus pressure and sore throat.   Eyes: Negative.   Respiratory:  Positive for cough. Negative for shortness of breath and wheezing.        Objective:   Physical Exam Constitutional:      Appearance: Normal appearance. She is not ill-appearing.     Comments: Her voice is hoarse   HENT:     Right Ear: Tympanic membrane, ear canal and external ear normal.     Left Ear: Tympanic membrane, ear canal and external ear normal.     Nose: Nose normal.     Mouth/Throat:     Pharynx: Oropharynx is clear.  Eyes:     Conjunctiva/sclera: Conjunctivae normal.  Pulmonary:     Effort: Pulmonary effort is normal.     Breath sounds: Normal breath sounds.  Musculoskeletal:     Comments: There is a 1 cm soft non-tender mobile lump on the ulnar edge of the right wrist   Lymphadenopathy:     Cervical: No cervical adenopathy.  Neurological:     Mental Status: She is alert.           Assessment & Plan:  She has a viral URI. She can drink fluids and use Delsym as needed. She also has a ganglion cyst on the right wrist. These can sometimes resolve on their own, so we agreed to monitor this for now.  Gershon Crane, MD

## 2023-06-05 ENCOUNTER — Other Ambulatory Visit (HOSPITAL_COMMUNITY): Payer: Self-pay

## 2023-06-06 ENCOUNTER — Other Ambulatory Visit: Payer: Self-pay | Admitting: Family Medicine

## 2023-06-06 ENCOUNTER — Other Ambulatory Visit (HOSPITAL_COMMUNITY): Payer: Self-pay

## 2023-06-07 ENCOUNTER — Other Ambulatory Visit (HOSPITAL_COMMUNITY): Payer: Self-pay

## 2023-06-08 NOTE — Telephone Encounter (Signed)
Pt states she was not aware a prescription was sent from the hospital by [facility] with a start date of 05/15/23 with 3 refills.   Pt states she never picked this up.  Pt contacted [facility] Phone: [phone]  Fax: [phone]   And was told they do not have this Rx for her.   Pt states she is completely out, and would like a refill sent to [facility], at MD's earliest convenience.

## 2023-06-14 ENCOUNTER — Other Ambulatory Visit: Payer: Self-pay | Admitting: Family Medicine

## 2023-06-14 ENCOUNTER — Other Ambulatory Visit (HOSPITAL_BASED_OUTPATIENT_CLINIC_OR_DEPARTMENT_OTHER): Payer: Self-pay

## 2023-06-14 ENCOUNTER — Other Ambulatory Visit (HOSPITAL_COMMUNITY): Payer: Self-pay

## 2023-06-15 ENCOUNTER — Other Ambulatory Visit (HOSPITAL_COMMUNITY): Payer: Self-pay

## 2023-06-16 ENCOUNTER — Other Ambulatory Visit (HOSPITAL_COMMUNITY): Payer: Self-pay

## 2023-06-18 ENCOUNTER — Other Ambulatory Visit: Payer: Self-pay | Admitting: Family Medicine

## 2023-06-18 ENCOUNTER — Other Ambulatory Visit (HOSPITAL_COMMUNITY): Payer: Self-pay

## 2023-06-18 ENCOUNTER — Other Ambulatory Visit: Payer: No Typology Code available for payment source

## 2023-06-18 ENCOUNTER — Telehealth: Payer: Self-pay | Admitting: Family Medicine

## 2023-06-18 DIAGNOSIS — D72829 Elevated white blood cell count, unspecified: Secondary | ICD-10-CM

## 2023-06-18 LAB — CBC WITH DIFFERENTIAL/PLATELET
Basophils Absolute: 0 10*3/uL (ref 0.0–0.1)
Basophils Relative: 0.4 % (ref 0.0–3.0)
Eosinophils Absolute: 0.1 10*3/uL (ref 0.0–0.7)
Eosinophils Relative: 1.5 % (ref 0.0–5.0)
HCT: 35.9 % — ABNORMAL LOW (ref 36.0–46.0)
Hemoglobin: 11.8 g/dL — ABNORMAL LOW (ref 12.0–15.0)
Lymphocytes Relative: 25.6 % (ref 12.0–46.0)
Lymphs Abs: 1.8 10*3/uL (ref 0.7–4.0)
MCHC: 32.8 g/dL (ref 30.0–36.0)
MCV: 87.3 fl (ref 78.0–100.0)
Monocytes Absolute: 0.5 10*3/uL (ref 0.1–1.0)
Monocytes Relative: 6.3 % (ref 3.0–12.0)
Neutro Abs: 4.8 10*3/uL (ref 1.4–7.7)
Neutrophils Relative %: 66.2 % (ref 43.0–77.0)
Platelets: 283 10*3/uL (ref 150.0–400.0)
RBC: 4.11 Mil/uL (ref 3.87–5.11)
RDW: 13 % (ref 11.5–15.5)
WBC: 7.2 10*3/uL (ref 4.0–10.5)

## 2023-06-18 MED ORDER — ATORVASTATIN CALCIUM 10 MG PO TABS
10.0000 mg | ORAL_TABLET | Freq: Every day | ORAL | 3 refills | Status: DC
  Filled 2023-06-18: qty 90, 90d supply, fill #0
  Filled 2023-09-12: qty 90, 90d supply, fill #1
  Filled 2023-12-25: qty 90, 90d supply, fill #2
  Filled 2024-04-16: qty 90, 90d supply, fill #3

## 2023-06-18 NOTE — Telephone Encounter (Signed)
Pt states that she is having trouble getting her Lipitor filled.  The doctor at the hospital messaed up some of her prescriptions, however she states to let Dr. Clent Ridges know that she has not picked up any Atorvastatin at all.  Pt states she has been out of this prescription for about 2 weeks.   Pharmacy--please fill at [facility]

## 2023-06-19 ENCOUNTER — Other Ambulatory Visit: Payer: No Typology Code available for payment source

## 2023-06-19 ENCOUNTER — Other Ambulatory Visit: Payer: Self-pay

## 2023-06-19 ENCOUNTER — Other Ambulatory Visit (HOSPITAL_COMMUNITY): Payer: Self-pay

## 2023-06-19 DIAGNOSIS — E872 Acidosis, unspecified: Secondary | ICD-10-CM

## 2023-06-19 MED ORDER — BUPROPION HCL ER (XL) 150 MG PO TB24
150.0000 mg | ORAL_TABLET | Freq: Every day | ORAL | 1 refills | Status: DC
  Filled 2023-06-19 – 2023-07-23 (×2): qty 90, 90d supply, fill #0
  Filled 2023-12-06: qty 90, 90d supply, fill #1

## 2023-06-19 NOTE — Telephone Encounter (Signed)
Spoke with pt advised that Rx was sent to [facility]

## 2023-06-20 ENCOUNTER — Other Ambulatory Visit (HOSPITAL_COMMUNITY): Payer: Self-pay

## 2023-06-22 ENCOUNTER — Ambulatory Visit: Payer: No Typology Code available for payment source

## 2023-06-22 ENCOUNTER — Other Ambulatory Visit: Payer: Self-pay

## 2023-06-27 ENCOUNTER — Other Ambulatory Visit: Payer: Self-pay

## 2023-06-27 ENCOUNTER — Other Ambulatory Visit: Payer: Self-pay | Admitting: Family Medicine

## 2023-06-27 MED ORDER — VITAMIN D (ERGOCALCIFEROL) 1.25 MG (50000 UNIT) PO CAPS
50000.0000 [IU] | ORAL_CAPSULE | ORAL | 3 refills | Status: DC
  Filled 2023-06-27: qty 12, 84d supply, fill #0
  Filled 2023-10-07: qty 12, 84d supply, fill #1
  Filled 2023-12-25 – 2024-01-04 (×2): qty 12, 84d supply, fill #2
  Filled 2024-04-16: qty 12, 84d supply, fill #3

## 2023-07-03 ENCOUNTER — Ambulatory Visit: Payer: No Typology Code available for payment source

## 2023-07-04 ENCOUNTER — Ambulatory Visit
Admission: RE | Admit: 2023-07-04 | Discharge: 2023-07-04 | Disposition: A | Discharge status: Home / Self Care | Payer: No Typology Code available for payment source | Source: Ambulatory Visit | Attending: Family Medicine | Admitting: Family Medicine

## 2023-07-04 DIAGNOSIS — Z1231 Encounter for screening mammogram for malignant neoplasm of breast: Secondary | ICD-10-CM

## 2023-07-16 ENCOUNTER — Encounter: Payer: Self-pay | Admitting: Family Medicine

## 2023-07-16 DIAGNOSIS — M67431 Ganglion, right wrist: Secondary | ICD-10-CM

## 2023-07-17 NOTE — Telephone Encounter (Signed)
I did the referral 

## 2023-07-20 ENCOUNTER — Other Ambulatory Visit (HOSPITAL_COMMUNITY): Payer: Self-pay

## 2023-07-22 ENCOUNTER — Other Ambulatory Visit (HOSPITAL_COMMUNITY): Payer: Self-pay

## 2023-07-23 ENCOUNTER — Other Ambulatory Visit: Payer: Self-pay

## 2023-07-23 ENCOUNTER — Other Ambulatory Visit (HOSPITAL_COMMUNITY): Payer: Self-pay

## 2023-08-08 ENCOUNTER — Other Ambulatory Visit (HOSPITAL_BASED_OUTPATIENT_CLINIC_OR_DEPARTMENT_OTHER): Payer: Self-pay

## 2023-08-16 ENCOUNTER — Other Ambulatory Visit: Payer: Self-pay

## 2023-08-16 ENCOUNTER — Other Ambulatory Visit (HOSPITAL_COMMUNITY): Payer: Self-pay

## 2023-08-17 ENCOUNTER — Encounter: Payer: No Typology Code available for payment source | Admitting: Family Medicine

## 2023-09-07 ENCOUNTER — Ambulatory Visit (INDEPENDENT_AMBULATORY_CARE_PROVIDER_SITE_OTHER): Payer: No Typology Code available for payment source | Admitting: Family Medicine

## 2023-09-07 ENCOUNTER — Encounter: Payer: Self-pay | Admitting: Family Medicine

## 2023-09-07 VITALS — BP 116/72 | HR 65 | Temp 98.0°F | Ht 66.5 in | Wt 160.0 lb

## 2023-09-07 DIAGNOSIS — Z0000 Encounter for general adult medical examination without abnormal findings: Secondary | ICD-10-CM | POA: Diagnosis not present

## 2023-09-07 DIAGNOSIS — Z23 Encounter for immunization: Secondary | ICD-10-CM

## 2023-09-07 DIAGNOSIS — M810 Age-related osteoporosis without current pathological fracture: Secondary | ICD-10-CM

## 2023-09-07 DIAGNOSIS — E559 Vitamin D deficiency, unspecified: Secondary | ICD-10-CM

## 2023-09-07 LAB — HEPATIC FUNCTION PANEL
ALT: 19 U/L (ref 0–35)
AST: 19 U/L (ref 0–37)
Albumin: 4.1 g/dL (ref 3.5–5.2)
Alkaline Phosphatase: 58 U/L (ref 39–117)
Bilirubin, Direct: 0.1 mg/dL (ref 0.0–0.3)
Total Bilirubin: 0.4 mg/dL (ref 0.2–1.2)
Total Protein: 6.7 g/dL (ref 6.0–8.3)

## 2023-09-07 LAB — CBC WITH DIFFERENTIAL/PLATELET
Basophils Absolute: 0 10*3/uL (ref 0.0–0.1)
Basophils Relative: 0.5 % (ref 0.0–3.0)
Eosinophils Absolute: 0.1 10*3/uL (ref 0.0–0.7)
Eosinophils Relative: 2.1 % (ref 0.0–5.0)
HCT: 37.4 % (ref 36.0–46.0)
Hemoglobin: 12.1 g/dL (ref 12.0–15.0)
Lymphocytes Relative: 24.5 % (ref 12.0–46.0)
Lymphs Abs: 1.6 10*3/uL (ref 0.7–4.0)
MCHC: 32.5 g/dL (ref 30.0–36.0)
MCV: 87.2 fL (ref 78.0–100.0)
Monocytes Absolute: 0.5 10*3/uL (ref 0.1–1.0)
Monocytes Relative: 7.4 % (ref 3.0–12.0)
Neutro Abs: 4.2 10*3/uL (ref 1.4–7.7)
Neutrophils Relative %: 65.5 % (ref 43.0–77.0)
Platelets: 237 10*3/uL (ref 150.0–400.0)
RBC: 4.29 Mil/uL (ref 3.87–5.11)
RDW: 13.4 % (ref 11.5–15.5)
WBC: 6.4 10*3/uL (ref 4.0–10.5)

## 2023-09-07 LAB — LIPID PANEL
Cholesterol: 133 mg/dL (ref 0–200)
HDL: 54.9 mg/dL (ref >39.00)
LDL Cholesterol: 51 mg/dL (ref 0–99)
NonHDL: 78.32
Total CHOL/HDL Ratio: 2
Triglycerides: 137 mg/dL (ref 0.0–149.0)
VLDL: 27.4 mg/dL (ref 0.0–40.0)

## 2023-09-07 LAB — BASIC METABOLIC PANEL
BUN: 13 mg/dL (ref 6–23)
CO2: 30 meq/L (ref 19–32)
Calcium: 9.3 mg/dL (ref 8.4–10.5)
Chloride: 104 meq/L (ref 96–112)
Creatinine, Ser: 1.11 mg/dL (ref 0.40–1.20)
GFR: 52.55 mL/min — ABNORMAL LOW (ref >60.00)
Glucose, Bld: 91 mg/dL (ref 70–99)
Potassium: 4.2 meq/L (ref 3.5–5.1)
Sodium: 140 meq/L (ref 135–145)

## 2023-09-07 LAB — HEMOGLOBIN A1C: Hgb A1c MFr Bld: 6.4 % (ref 4.6–6.5)

## 2023-09-07 LAB — TSH: TSH: 1.3 u[IU]/mL (ref 0.35–5.50)

## 2023-09-07 LAB — VITAMIN D 25 HYDROXY (VIT D DEFICIENCY, FRACTURES): VITD: 64.76 ng/mL (ref 30.00–100.00)

## 2023-09-07 NOTE — Addendum Note (Signed)
Addended by: Carola Rhine on: 09/07/2023 03:26 PM   Modules accepted: Orders

## 2023-09-07 NOTE — Progress Notes (Signed)
Subjective:    Patient ID: Tracy Anderson, female    DOB: Aug 12, 1961, 62 y.o.   MRN: [id number]  HPI Here for a well exam. She feels well. She is tolerating the [facility] well. She has lost about 6 lbs so far. She does mention some thinning of her hair in the past month or so.    Review of Systems  Constitutional: Negative.   HENT: Negative.    Eyes: Negative.   Respiratory: Negative.    Cardiovascular: Negative.   Gastrointestinal: Negative.   Genitourinary:  Negative for decreased urine volume, difficulty urinating, dyspareunia, dysuria, enuresis, flank pain, frequency, hematuria, pelvic pain and urgency.  Musculoskeletal: Negative.   Skin: Negative.   Neurological: Negative.  Negative for headaches.  Psychiatric/Behavioral: Negative.         Objective:   Physical Exam Constitutional:      General: She is not in acute distress.    Appearance: Normal appearance. She is well-developed.  HENT:     Head: Normocephalic and atraumatic.     Right Ear: External ear normal.     Left Ear: External ear normal.     Nose: Nose normal.     Mouth/Throat:     Pharynx: No oropharyngeal exudate.  Eyes:     General: No scleral icterus.    Conjunctiva/sclera: Conjunctivae normal.     Pupils: Pupils are equal, round, and reactive to light.  Neck:     Thyroid: No thyromegaly.     Vascular: No JVD.  Cardiovascular:     Rate and Rhythm: Normal rate and regular rhythm.     Pulses: Normal pulses.     Heart sounds: Normal heart sounds. No murmur heard.    No friction rub. No gallop.  Pulmonary:     Effort: Pulmonary effort is normal. No respiratory distress.     Breath sounds: Normal breath sounds. No wheezing or rales.  Chest:     Chest wall: No tenderness.  Abdominal:     General: Bowel sounds are normal. There is no distension.     Palpations: Abdomen is soft. There is no mass.     Tenderness: There is no abdominal tenderness. There is no guarding or rebound.   Musculoskeletal:        General: No tenderness. Normal range of motion.     Cervical back: Normal range of motion and neck supple.  Lymphadenopathy:     Cervical: No cervical adenopathy.  Skin:    General: Skin is warm and dry.     Findings: No erythema or rash.  Neurological:     General: No focal deficit present.     Mental Status: She is alert and oriented to person, place, and time.     Cranial Nerves: No cranial nerve deficit.     Motor: No abnormal muscle tone.     Coordination: Coordination normal.     Deep Tendon Reflexes: Reflexes are normal and symmetric. Reflexes normal.  Psychiatric:        Mood and Affect: Mood normal.        Behavior: Behavior normal.        Thought Content: Thought content normal.        Judgment: Judgment normal.           Assessment & Plan:  Well exam. We discussed diet and exercise. Get fasting labs. Set up her first DEXA. I think her thinning hair is the result of losing weight rapidly. I reassured her that this usually  stops and then reverses itself.  Gershon Crane, MD

## 2023-09-10 ENCOUNTER — Other Ambulatory Visit (HOSPITAL_COMMUNITY): Payer: Self-pay

## 2023-09-10 MED ORDER — CIPROFLOXACIN HCL 500 MG PO TABS: 500.0000 mg | ORAL_TABLET | Freq: Two times a day (BID) | ORAL | 1 refills | Status: DC

## 2023-09-12 ENCOUNTER — Other Ambulatory Visit (HOSPITAL_COMMUNITY): Payer: Self-pay

## 2023-09-12 ENCOUNTER — Other Ambulatory Visit: Payer: Self-pay

## 2023-09-14 ENCOUNTER — Ambulatory Visit (INDEPENDENT_AMBULATORY_CARE_PROVIDER_SITE_OTHER)
Admission: RE | Admit: 2023-09-14 | Discharge: 2023-09-14 | Disposition: A | Discharge status: Home / Self Care | Payer: No Typology Code available for payment source | Source: Ambulatory Visit | Attending: Family Medicine | Admitting: Family Medicine

## 2023-09-14 DIAGNOSIS — M810 Age-related osteoporosis without current pathological fracture: Secondary | ICD-10-CM

## 2023-09-25 ENCOUNTER — Encounter: Payer: Self-pay | Admitting: Family Medicine

## 2023-10-01 ENCOUNTER — Telehealth: Payer: Self-pay

## 2023-10-01 NOTE — Telephone Encounter (Signed)
Pt message sent to  PCP for advise ?

## 2023-10-02 NOTE — Telephone Encounter (Signed)
No stay on the once a week pill

## 2023-10-03 NOTE — Telephone Encounter (Signed)
Left detailed message for pt regarding Dr Clent Ridges advise ?

## 2023-10-08 ENCOUNTER — Other Ambulatory Visit (HOSPITAL_COMMUNITY): Payer: Self-pay

## 2023-10-12 ENCOUNTER — Other Ambulatory Visit (HOSPITAL_COMMUNITY): Payer: Self-pay

## 2023-10-12 ENCOUNTER — Other Ambulatory Visit: Payer: Self-pay

## 2023-11-11 ENCOUNTER — Other Ambulatory Visit (HOSPITAL_COMMUNITY): Payer: Self-pay

## 2023-11-12 ENCOUNTER — Other Ambulatory Visit: Payer: Self-pay

## 2023-12-07 ENCOUNTER — Other Ambulatory Visit: Payer: Self-pay

## 2023-12-10 ENCOUNTER — Other Ambulatory Visit (HOSPITAL_COMMUNITY): Payer: Self-pay

## 2023-12-25 ENCOUNTER — Other Ambulatory Visit: Payer: Self-pay

## 2024-01-05 ENCOUNTER — Other Ambulatory Visit (HOSPITAL_COMMUNITY): Payer: Self-pay

## 2024-01-07 ENCOUNTER — Other Ambulatory Visit: Payer: Self-pay

## 2024-01-08 ENCOUNTER — Other Ambulatory Visit: Payer: Self-pay

## 2024-02-06 ENCOUNTER — Other Ambulatory Visit (HOSPITAL_COMMUNITY): Payer: Self-pay

## 2024-02-07 ENCOUNTER — Other Ambulatory Visit (HOSPITAL_COMMUNITY): Payer: Self-pay

## 2024-02-29 ENCOUNTER — Other Ambulatory Visit: Payer: Self-pay

## 2024-02-29 ENCOUNTER — Other Ambulatory Visit (HOSPITAL_COMMUNITY): Payer: Self-pay

## 2024-03-10 ENCOUNTER — Encounter: Payer: Self-pay | Admitting: Family Medicine

## 2024-03-10 ENCOUNTER — Ambulatory Visit: Payer: No Typology Code available for payment source | Admitting: Family Medicine

## 2024-03-10 ENCOUNTER — Other Ambulatory Visit (HOSPITAL_COMMUNITY): Payer: Self-pay

## 2024-03-10 VITALS — BP 108/72 | HR 70 | Temp 98.0°F | Wt 160.2 lb

## 2024-03-10 DIAGNOSIS — E119 Type 2 diabetes mellitus without complications: Secondary | ICD-10-CM | POA: Diagnosis not present

## 2024-03-10 DIAGNOSIS — Z7985 Long-term (current) use of injectable non-insulin antidiabetic drugs: Secondary | ICD-10-CM

## 2024-03-10 LAB — POCT GLYCOSYLATED HEMOGLOBIN (HGB A1C): Hemoglobin A1C: 5.9 % — AB (ref 4.0–5.6)

## 2024-03-10 MED ORDER — TIRZEPATIDE 15 MG/0.5ML ~~LOC~~ SOAJ
15.0000 mg | SUBCUTANEOUS | 5 refills | Status: DC
  Filled 2024-03-10: qty 6, 84d supply, fill #0
  Filled 2024-04-02: qty 2, 28d supply, fill #0

## 2024-03-10 NOTE — Addendum Note (Signed)
 Addended by: Philbert Brave on: 03/10/2024 01:46 PM   Modules accepted: Orders

## 2024-03-10 NOTE — Progress Notes (Signed)
   Subjective:    Patient ID: Tracy Anderson, female    DOB: 13-Jun-1961, 63 y.o.   MRN: [id number]  HPI Here to follow up on her diabetes and her weight loss efforts. Since our last visit she has felt well other than some occasional nausea. No abdominal pain or heartburn. Her BM's are regular. She thinks this may be the result of stress. 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 She is happy about this, but she has been a bit stressed. She asks to go up on the dose of Mounjaro  if possible. Her weight has not changed since November, but her A1c today is down to 5.9%.    Review of Systems  Constitutional: Negative.   Respiratory: Negative.    Cardiovascular: Negative.   Gastrointestinal:  Positive for nausea. Negative for abdominal distention, abdominal pain, blood in stool, constipation, diarrhea and vomiting.  Genitourinary: Negative.        Objective:   Physical Exam Constitutional:      Appearance: Normal appearance.  Cardiovascular:     Rate and Rhythm: Normal rate and regular rhythm.     Pulses: Normal pulses.     Heart sounds: Normal heart sounds.  Pulmonary:     Effort: Pulmonary effort is normal.     Breath sounds: Normal breath sounds.  Abdominal:     General: Abdomen is flat. Bowel sounds are normal. There is no distension.     Palpations: Abdomen is soft. There is no mass.     Tenderness: There is no abdominal tenderness. There is no guarding or rebound.     Hernia: No hernia is present.  Neurological:     Mental Status: She is alert.           Assessment & Plan:  Her diabetes is well controlled. We agreed to increase the Mounjaro  to 15 mg weekly. She will report back in 3- weeks about the nausea.  Corita Diego, MD

## 2024-03-11 ENCOUNTER — Other Ambulatory Visit (HOSPITAL_COMMUNITY): Payer: Self-pay

## 2024-03-13 ENCOUNTER — Other Ambulatory Visit: Payer: Self-pay | Admitting: Family Medicine

## 2024-03-13 ENCOUNTER — Telehealth: Payer: Self-pay | Admitting: Family Medicine

## 2024-03-13 ENCOUNTER — Other Ambulatory Visit (HOSPITAL_COMMUNITY): Payer: Self-pay

## 2024-03-13 ENCOUNTER — Other Ambulatory Visit: Payer: Self-pay

## 2024-03-13 MED ORDER — BUPROPION HCL ER (XL) 150 MG PO TB24
150.0000 mg | ORAL_TABLET | Freq: Every day | ORAL | 1 refills | Status: DC
  Filled 2024-03-13: qty 90, 90d supply, fill #0
  Filled 2024-04-02 – 2024-06-17 (×2): qty 90, 90d supply, fill #1

## 2024-03-13 NOTE — Telephone Encounter (Signed)
 Copied from CRM [id number]. Topic: General - Call Back - No Documentation >> Mar 13, 2024 10:02 AM Stanly Early wrote: Reason for CRM: patient missed a call and was returning.I don't see in the notes someone gave her a call.

## 2024-04-02 ENCOUNTER — Other Ambulatory Visit (HOSPITAL_COMMUNITY): Payer: Self-pay

## 2024-04-03 ENCOUNTER — Other Ambulatory Visit: Payer: Self-pay | Admitting: Family Medicine

## 2024-04-03 ENCOUNTER — Encounter: Payer: Self-pay | Admitting: Pharmacist

## 2024-04-03 ENCOUNTER — Telehealth: Payer: Self-pay | Admitting: *Deleted

## 2024-04-03 ENCOUNTER — Other Ambulatory Visit: Payer: Self-pay

## 2024-04-03 ENCOUNTER — Other Ambulatory Visit (HOSPITAL_COMMUNITY): Payer: Self-pay

## 2024-04-03 ENCOUNTER — Telehealth: Payer: Self-pay

## 2024-04-03 MED ORDER — TIRZEPATIDE 12.5 MG/0.5ML ~~LOC~~ SOAJ
12.5000 mg | SUBCUTANEOUS | 5 refills | Status: DC
  Filled 2024-04-03: qty 2, 28d supply, fill #0

## 2024-04-03 NOTE — Telephone Encounter (Signed)
 Pt PCP notified.

## 2024-04-03 NOTE — Addendum Note (Signed)
 Addended by: Corita Diego A on: 04/03/2024 12:13 PM   Modules accepted: Orders

## 2024-04-03 NOTE — Telephone Encounter (Signed)
 Copied from CRM [id number]. Topic: Clinical - Medication Question >> Apr 03, 2024  1:35 PM Trula Gable C wrote: Reason for FAO:[id number] called in regarding tirzepatide  (MOUNJARO ) 15 MG , stated she has gotten it figured out with her insurance and copay ad would like for the 15MG  to be resent to the pharmacy

## 2024-04-03 NOTE — Telephone Encounter (Signed)
 Done

## 2024-04-04 ENCOUNTER — Other Ambulatory Visit: Payer: Self-pay

## 2024-04-04 ENCOUNTER — Other Ambulatory Visit (HOSPITAL_BASED_OUTPATIENT_CLINIC_OR_DEPARTMENT_OTHER): Payer: Self-pay

## 2024-04-04 ENCOUNTER — Other Ambulatory Visit (HOSPITAL_COMMUNITY): Payer: Self-pay

## 2024-04-04 MED ORDER — TIRZEPATIDE 15 MG/0.5ML ~~LOC~~ SOAJ
15.0000 mg | SUBCUTANEOUS | 5 refills | Status: DC
  Filled 2024-04-04: qty 2, 28d supply, fill #0
  Filled 2024-04-26: qty 2, 28d supply, fill #1
  Filled 2024-05-25: qty 2, 28d supply, fill #2
  Filled 2024-06-24: qty 2, 28d supply, fill #3
  Filled 2024-07-11 – 2024-07-16 (×2): qty 2, 28d supply, fill #4
  Filled 2024-08-18: qty 2, 28d supply, fill #5
  Filled 2024-09-12: qty 2, 28d supply, fill #6

## 2024-04-04 NOTE — Telephone Encounter (Signed)
 Done

## 2024-04-04 NOTE — Telephone Encounter (Signed)
This was done this morning  

## 2024-04-04 NOTE — Telephone Encounter (Signed)
 FYI Pt called the office requests if Dr Alyne Babinski could send the old Mounjaro  15 mg Rx to er pharmacy since pt greed on the price with her insurance, Mounjaro  12.5 cancelled and a new Rx for Mounjaro  15 was sent to pt pharmacy

## 2024-04-07 ENCOUNTER — Other Ambulatory Visit (HOSPITAL_COMMUNITY): Payer: Self-pay

## 2024-04-17 ENCOUNTER — Other Ambulatory Visit (HOSPITAL_COMMUNITY): Payer: Self-pay

## 2024-04-26 ENCOUNTER — Other Ambulatory Visit (HOSPITAL_COMMUNITY): Payer: Self-pay

## 2024-04-28 ENCOUNTER — Other Ambulatory Visit: Payer: Self-pay

## 2024-05-20 ENCOUNTER — Encounter: Payer: Self-pay | Admitting: Family Medicine

## 2024-05-20 ENCOUNTER — Ambulatory Visit (INDEPENDENT_AMBULATORY_CARE_PROVIDER_SITE_OTHER): Admitting: Family Medicine

## 2024-05-20 VITALS — BP 124/76 | HR 73 | Temp 97.8°F | Wt 160.4 lb

## 2024-05-20 DIAGNOSIS — R070 Pain in throat: Secondary | ICD-10-CM | POA: Diagnosis not present

## 2024-05-20 DIAGNOSIS — K1370 Unspecified lesions of oral mucosa: Secondary | ICD-10-CM

## 2024-05-20 NOTE — Progress Notes (Signed)
   Subjective:    Patient ID: Tracy Anderson, female    DOB: [birth year], 63 y.o.   MRN: [id number]  HPI Here for some spots on the left side of her throat and some left neck pain that began 2 months ago. The spots are always there, but the pain comes and goes. She feels fine in general, no fever. No trouble swallowing.    Review of Systems  Constitutional: Negative.   HENT:  Negative for congestion, ear pain, postnasal drip, sinus pressure, sore throat and trouble swallowing.   Eyes: Negative.   Respiratory: Negative.         Objective:   Physical Exam Constitutional:      Appearance: Normal appearance.  HENT:     Right Ear: Tympanic membrane, ear canal and external ear normal.     Left Ear: Tympanic membrane, ear canal and external ear normal.     Nose: Nose normal.     Mouth/Throat:     Pharynx: No posterior oropharyngeal erythema.     Comments: There is a small cluster of papular lesions on the left posterior OP.  Eyes:     Conjunctiva/sclera: Conjunctivae normal.  Neck:     Comments: There are no palpable lymph nodes but she is tender on the left anterior neck lateral to the thyroid  Pulmonary:     Effort: Pulmonary effort is normal.     Breath sounds: Normal breath sounds.  Neurological:     Mental Status: She is alert.           Assessment & Plan:  Pharyngeal lesions with left sided neck pain. We will refer to ENT to evaluate further.  Garnette Olmsted, MD

## 2024-05-26 ENCOUNTER — Other Ambulatory Visit: Payer: Self-pay

## 2024-05-26 ENCOUNTER — Other Ambulatory Visit (HOSPITAL_COMMUNITY): Payer: Self-pay

## 2024-05-27 ENCOUNTER — Other Ambulatory Visit: Payer: Self-pay

## 2024-06-17 ENCOUNTER — Other Ambulatory Visit (HOSPITAL_COMMUNITY): Payer: Self-pay

## 2024-06-17 ENCOUNTER — Other Ambulatory Visit: Payer: Self-pay

## 2024-06-25 ENCOUNTER — Other Ambulatory Visit (HOSPITAL_COMMUNITY): Payer: Self-pay

## 2024-06-27 ENCOUNTER — Ambulatory Visit: Admitting: Family Medicine

## 2024-07-08 ENCOUNTER — Telehealth (INDEPENDENT_AMBULATORY_CARE_PROVIDER_SITE_OTHER): Payer: Self-pay | Admitting: Otolaryngology

## 2024-07-08 NOTE — Telephone Encounter (Signed)
 Tried to call to r/s 9/17 appointment vm full

## 2024-07-11 ENCOUNTER — Other Ambulatory Visit: Payer: Self-pay

## 2024-07-16 ENCOUNTER — Other Ambulatory Visit (HOSPITAL_COMMUNITY): Payer: Self-pay

## 2024-07-16 ENCOUNTER — Other Ambulatory Visit: Payer: Self-pay

## 2024-07-18 ENCOUNTER — Ambulatory Visit: Payer: Self-pay | Admitting: *Deleted

## 2024-07-18 NOTE — Telephone Encounter (Signed)
 FYI Only or Action Required?: Action required by provider: clinical question for provider.  Patient was last seen in primary care on 05/20/2024 by Tracy Garnette LABOR, MD.  Called Nurse Triage reporting Covid Exposure.  Symptoms began today.  Interventions attempted: Other: will wear mask.  Symptoms are: stable.  Triage Disposition: Call PCP Within 24 Hours  Patient/caregiver understands and will follow disposition?: No, wishes to speak with PCP  Patient husband has VV with a provider today. Patient denies sx and reports she will wear mask around others. Recommended quarantine if sx noted and to test for covid within 5-14 days .  Patient requesting call back if PCP feels she needs to quarantine.           Copied from CRM #[id number]. Topic: Clinical - Medical Advice >> Jul 18, 2024 10:59 AM Tracy Anderson wrote: Reason for CRM: Pt has been exposed to COVID by spouse but has no symptoms. She wants to know if she should quarantine with husband. She says she has plans for this evening and would ike to know before she goes out Reason for Disposition  [1] COVID-19 EXPOSURE within last 14 days AND [2] requests COVID-19 lab test to return to work AND [3] NO symptoms  Answer Assessment - Initial Assessment Questions Recommended to test for covid. Patient reports her husband tested with last test in home. Denies sx but concerned if she should go out around others. Please advise. Requesting PCP advise. Husband tested positive today.      1. COVID-19 EXPOSURE: Please describe how you were exposed to someone with a COVID-19 infection.     Husband same house hold 2. PLACE of CONTACT: Where were you when you were exposed to COVID-19? (e.g., home, school, medical waiting room; which city?)     Home  3. TYPE of CONTACT: How much contact was there? (e.g., sitting next to, live in same house, work in same office, same building)     Live in same house 4. DURATION of CONTACT: How long were you in  contact with the COVID-19 patient? (e.g., a few seconds, passed by person, a few minutes, 15 minutes or longer, live with the patient)     Longer than 15 minutes 5. MASK: Were you wearing a mask? Was the other person wearing a mask? Note: wearing a mask reduces the risk of an otherwise close contact.     no 6. DATE of CONTACT: When did you have contact with a COVID-19 patient? (e.g., how many days ago)     Now  7. COMMUNITY SPREAD: Do you live in or have you traveled to an area where there are lots of COVID-19 cases (community spread)? (See public health department website, if unsure)       [place]  8. SYMPTOMS: Do you have any symptoms? (e.g., fever, cough, breathing difficulty, loss of taste or smell)     No sx 9. VACCINE: Have you gotten the COVID-19 vaccine? If Yes, ask: Which one, how many shots, when did you get it?     na 10. PREGNANCY OR POSTPARTUM: Is there any chance you are pregnant? When was your last menstrual period? Did you deliver in the last 2 weeks?       na 11. HIGH RISK: Do you have any heart or lung problems? (e.g., asthma, COPD, heart failure) Do you have a weak immune system or other risk factors? (e.g., HIV positive, chemotherapy, renal failure, diabetes mellitus, sickle cell anemia, obesity)  na  Protocols used: Coronavirus (COVID-19) Exposure-A-AH

## 2024-07-21 NOTE — Telephone Encounter (Signed)
 FYI Spoke with pt states that she does not have any symptoms and doesn't think that she needs appointment at this time, states that she is wearing a mask at home and work, pt advised to cal the office if any changes.

## 2024-07-21 NOTE — Telephone Encounter (Signed)
 Set up a virtual OV with me today for this

## 2024-07-23 ENCOUNTER — Other Ambulatory Visit: Payer: Self-pay | Admitting: Family Medicine

## 2024-07-23 ENCOUNTER — Institutional Professional Consult (permissible substitution) (INDEPENDENT_AMBULATORY_CARE_PROVIDER_SITE_OTHER): Admitting: Otolaryngology

## 2024-07-23 ENCOUNTER — Other Ambulatory Visit (HOSPITAL_COMMUNITY): Payer: Self-pay

## 2024-07-23 MED ORDER — ATORVASTATIN CALCIUM 10 MG PO TABS
10.0000 mg | ORAL_TABLET | Freq: Every day | ORAL | 3 refills | Status: AC
  Filled 2024-07-23: qty 90, 90d supply, fill #0
  Filled 2024-09-12 – 2024-11-05 (×2): qty 90, 90d supply, fill #1

## 2024-07-23 MED ORDER — VITAMIN D (ERGOCALCIFEROL) 1.25 MG (50000 UNIT) PO CAPS
50000.0000 [IU] | ORAL_CAPSULE | ORAL | 3 refills | Status: AC
  Filled 2024-07-23: qty 12, 84d supply, fill #0
  Filled 2024-09-12 – 2024-11-16 (×2): qty 12, 84d supply, fill #1

## 2024-07-29 ENCOUNTER — Encounter (INDEPENDENT_AMBULATORY_CARE_PROVIDER_SITE_OTHER): Payer: Self-pay | Admitting: Otolaryngology

## 2024-07-29 ENCOUNTER — Ambulatory Visit (INDEPENDENT_AMBULATORY_CARE_PROVIDER_SITE_OTHER): Admitting: Otolaryngology

## 2024-07-29 VITALS — BP 145/80 | HR 82

## 2024-07-29 DIAGNOSIS — M542 Cervicalgia: Secondary | ICD-10-CM

## 2024-07-29 DIAGNOSIS — K219 Gastro-esophageal reflux disease without esophagitis: Secondary | ICD-10-CM

## 2024-07-29 DIAGNOSIS — Z87891 Personal history of nicotine dependence: Secondary | ICD-10-CM

## 2024-07-29 DIAGNOSIS — J312 Chronic pharyngitis: Secondary | ICD-10-CM

## 2024-07-29 DIAGNOSIS — J358 Other chronic diseases of tonsils and adenoids: Secondary | ICD-10-CM | POA: Diagnosis not present

## 2024-07-29 MED ORDER — FAMOTIDINE 20 MG PO TABS: 20.0000 mg | ORAL_TABLET | Freq: Two times a day (BID) | ORAL | 1 refills | Status: DC

## 2024-07-29 NOTE — Patient Instructions (Signed)

## 2024-07-29 NOTE — Progress Notes (Signed)
 ENT CONSULT:  Reason for Consult: chronic sore throat    HPI: Discussed the use of AI scribe software for clinical note transcription with the patient, who gave verbal consent to proceed.  History of Present Illness Tracy Anderson is a 63 year old female who presents with neck pain and white spots on the back of her throat.  She has been experiencing neck pain for several months, which she can localize by touch along posterior and mid portion of left SCM. The pain varies in intensity, being more pronounced on some days than others. She does not recall any specific event that triggered the pain.  During a previous visit with PCP, spots were noted on the back of her throat, described as a 'cluster of white'. She denies any history of tonsil stones and has not been told she has them before.  She mentions a probable history of allergies, particularly during this time of year, but states she has not had trouble with allergies for a few years and is not currently taking any medication for them. No throat clearing or cough is present.  Records Reviewed:  Office visit with Dr Johnny PCP 05/20/24  Here for some spots on the left side of her throat and some left neck pain that began 2 months ago. The spots are always there, but the pain comes and goes. She feels fine in general, no fever. No trouble swallowing     Past Medical History:  Diagnosis Date   Arthritis    Asthma    as a child   Atypical mole 01/25/2018   mild atypia on left inner thigh   Back pain    Basal cell carcinoma 12/20/2018   nodular on right nose - MOHs   Depression    Diabetes mellitus type II    Herpes genitalia    Hyperlipidemia    Shellfish allergy    Shingles 2012   right flank    Past Surgical History:  Procedure Laterality Date   CHOLECYSTECTOMY N/A 07/05/2016   Procedure: LAPAROSCOPIC CHOLECYSTECTOMY;  Surgeon: Dann Hummer, MD;  Location: [facility] OR;  Service: General;  Laterality: N/A;    colonscopy  02/26/2020   per Dr. Shila, adenomatous polyp, repeat in 7 yrs    SHOULDER ARTHROSCOPY WITH DISTAL CLAVICLE RESECTION Right 01/04/2023   Procedure: SHOULDER ARTHROSCOPY WITH DISTAL CLAVICLE EXCISION;  Surgeon: Cristy Bonner DASEN, MD;  Location: [facility];  Service: Orthopedics;  Laterality: Right;   SHOULDER ARTHROSCOPY WITH SUBACROMIAL DECOMPRESSION, ROTATOR CUFF REPAIR AND BICEP TENDON REPAIR Right 01/04/2023   Procedure: SHOULDER ARTHROSCOPY WITH SUBACROMIAL DECOMPRESSION, ROTATOR CUFF REPAIR AND BICEP TENDON REPAIR;  Surgeon: Cristy Bonner DASEN, MD;  Location: [facility];  Service: Orthopedics;  Laterality: Right;   TYMPANOSTOMY TUBE PLACEMENT      Family History  Problem Relation Age of Onset   Arthritis Other    Cancer Other        cervical   Depression Other    Diabetes Other    Ulcers Other    Breast cancer Maternal Grandmother    Diabetes Mother    Heart disease Mother    Cancer Mother    Depression Mother    Alcoholism Mother    Eating disorder Mother    Diabetes Father    Hypertension Father    Depression Father    Alcoholism Father    Colon polyps Neg Hx    Esophageal cancer Neg Hx    Stomach cancer Neg  Hx    Rectal cancer Neg Hx     Social History:  reports that she quit smoking about 33 years ago. Her smoking use included cigarettes. She has never used smokeless tobacco. She reports current alcohol use of about 2.0 standard drinks of alcohol per week. She reports that she does not use drugs.  Allergies:  Allergies  Allergen Reactions   Lisinopril  Cough   Shrimp [Shellfish Allergy] Diarrhea    Severe stomach cramps and diarrhea   Clarithromycin Other (See Comments)    UNSPECIFIED     Medications: I have reviewed the patient's current medications.  The PMH, PSH, Medications, Allergies, and SH were reviewed and updated.  ROS: Constitutional: Negative for fever, weight loss and weight gain. Cardiovascular: Negative for  chest pain and dyspnea on exertion. Respiratory: Is not experiencing shortness of breath at rest. Gastrointestinal: Negative for nausea and vomiting. Neurological: Negative for headaches. Psychiatric: The patient is not nervous/anxious  Blood pressure (!) 145/80, pulse 82, last menstrual period 08/08/2012, SpO2 95%. There is no height or weight on file to calculate BMI.  PHYSICAL EXAM:  Exam: General: Well-developed, well-nourished Communication and Voice: slightly raspy Respiratory Respiratory effort: Equal inspiration and expiration without stridor Cardiovascular Peripheral Vascular: Warm extremities with equal color/perfusion Eyes: No nystagmus with equal extraocular motion bilaterally Neuro/Psych/Balance: Patient oriented to person, place, and time; Appropriate mood and affect; Gait is intact with no imbalance; Cranial nerves I-XII are intact Head and Face Inspection: Normocephalic and atraumatic without mass or lesion Palpation: Facial skeleton intact without bony stepoffs Salivary Glands: No mass or tenderness Facial Strength: Facial motility symmetric and full bilaterally ENT Pinna: External ear intact and fully developed External canal: Canal is patent with intact skin Tympanic Membrane: Clear and mobile External Nose: No scar or anatomic deformity Internal Nose: Septum is straight. No polyp, or purulence. Mucosal edema and erythema present.  Bilateral inferior turbinate hypertrophy.  Lips, Teeth, and gums: Mucosa and teeth intact and viable TMJ: No pain to palpation with full mobility Oral cavity/oropharynx: No erythema or exudate, no lesions present 1+ tonsils with few small submucosal tonsil cysts Nasopharynx: No mass or lesion with intact mucosa Hypopharynx: Intact mucosa without pooling of secretions Larynx Glottic: Full true vocal cord mobility without lesion or mass Supraglottic: Normal appearing epiglottis and AE folds Interarytenoid Space: Moderate  pachydermia&edema Subglottic Space: Patent without lesion or edema Neck Neck and Trachea: Midline trachea without mass or lesion Thyroid : No mass or nodularity Lymphatics: No lymphadenopathy  Procedure: Preoperative diagnosis: chronic sore throat   Postoperative diagnosis:   Same + GERD LPR  Procedure: Flexible fiberoptic laryngoscopy  Surgeon: Elena Larry, MD  Anesthesia: Topical lidocaine  and Afrin Complications: None Condition is stable throughout exam  Indications and consent:  The patient presents to the clinic with above symptoms. Indirect laryngoscopy view was incomplete. Thus it was recommended that they undergo a flexible fiberoptic laryngoscopy. All of the risks, benefits, and potential complications were reviewed with the patient preoperatively and verbal informed consent was obtained.  Procedure: The patient was seated upright in the clinic. Topical lidocaine  and Afrin were applied to the nasal cavity. After adequate anesthesia had occurred, I then proceeded to pass the flexible telescope into the nasal cavity. The nasal cavity was patent without rhinorrhea or polyp. The nasopharynx was also patent without mass or lesion. The base of tongue was visualized and was normal. There were no signs of pooling of secretions in the piriform sinuses. The true vocal folds were mobile bilaterally. There were  no signs of glottic or supraglottic mucosal lesion or mass. There was moderate interarytenoid pachydermia and post cricoid edema. The telescope was then slowly withdrawn and the patient tolerated the procedure throughout.    Assessment/Plan: Encounter Diagnoses  Name Primary?   Chronic sore throat Yes   Cervicalgia    Chronic GERD    Tonsillar cyst     Assessment and Plan Assessment & Plan Chronic Neck pain and throat discomfort Chronic neck pain and throat discomfort, possibly musculoskeletal. She is pointing at mid posterior SCM as a point of pain, and does not have any  lymphadenopathy on exam. Flexible scope exam was also normal, with some changes c/w GERD LPR. Oropharyngeal exam with benign mucosal cysts present. No tonsil stones or abnormalities. - Provided reflux information in after visit summary. - Recommended trial of reflux medication. - Suggested seaweed-based reflux supplement post-meals.  GERD LPR - Pepcid  20 mg BID  -  Reflux Gourmet after meals - diet and lifestyle changes to minimize GERD - Refer to [employer] blog for dietary and lifestyle modifications/reflux cook book  Tonsil cysts Left side with few tonsil cysts  - monitor  - patient was reassured that these are not causing her sx     Thank you for allowing me to participate in the care of this patient. Please do not hesitate to contact me with any questions or concerns.   Elena Larry, MD Otolaryngology [facility] Phone: [phone] Fax: [phone]    07/29/2024, 1:42 PM

## 2024-08-18 ENCOUNTER — Other Ambulatory Visit (HOSPITAL_COMMUNITY): Payer: Self-pay

## 2024-08-18 ENCOUNTER — Other Ambulatory Visit: Payer: Self-pay

## 2024-08-27 ENCOUNTER — Other Ambulatory Visit: Payer: Self-pay | Admitting: Family Medicine

## 2024-08-27 DIAGNOSIS — Z1231 Encounter for screening mammogram for malignant neoplasm of breast: Secondary | ICD-10-CM

## 2024-09-08 ENCOUNTER — Encounter: Payer: No Typology Code available for payment source | Admitting: Family Medicine

## 2024-09-12 ENCOUNTER — Other Ambulatory Visit (HOSPITAL_BASED_OUTPATIENT_CLINIC_OR_DEPARTMENT_OTHER): Payer: Self-pay

## 2024-09-12 ENCOUNTER — Other Ambulatory Visit: Payer: Self-pay

## 2024-09-15 ENCOUNTER — Other Ambulatory Visit: Payer: Self-pay

## 2024-09-16 ENCOUNTER — Ambulatory Visit: Payer: Self-pay

## 2024-09-16 ENCOUNTER — Other Ambulatory Visit (HOSPITAL_COMMUNITY): Payer: Self-pay

## 2024-09-16 ENCOUNTER — Encounter: Payer: Self-pay | Admitting: Family Medicine

## 2024-09-16 ENCOUNTER — Other Ambulatory Visit: Payer: Self-pay

## 2024-09-16 ENCOUNTER — Ambulatory Visit (INDEPENDENT_AMBULATORY_CARE_PROVIDER_SITE_OTHER): Admitting: Family Medicine

## 2024-09-16 VITALS — BP 126/78 | HR 73 | Temp 98.0°F | Ht 66.5 in | Wt 162.4 lb

## 2024-09-16 DIAGNOSIS — Z23 Encounter for immunization: Secondary | ICD-10-CM | POA: Diagnosis not present

## 2024-09-16 DIAGNOSIS — Z0000 Encounter for general adult medical examination without abnormal findings: Secondary | ICD-10-CM | POA: Diagnosis not present

## 2024-09-16 DIAGNOSIS — E559 Vitamin D deficiency, unspecified: Secondary | ICD-10-CM

## 2024-09-16 DIAGNOSIS — E119 Type 2 diabetes mellitus without complications: Secondary | ICD-10-CM

## 2024-09-16 LAB — CBC WITH DIFFERENTIAL/PLATELET
Basophils Absolute: 0 K/uL (ref 0.0–0.1)
Basophils Relative: 0.3 % (ref 0.0–3.0)
Eosinophils Absolute: 0.1 K/uL (ref 0.0–0.7)
Eosinophils Relative: 1 % (ref 0.0–5.0)
HCT: 37.6 % (ref 36.0–46.0)
Hemoglobin: 12.8 g/dL (ref 12.0–15.0)
Lymphocytes Relative: 19.6 % (ref 12.0–46.0)
Lymphs Abs: 1.4 K/uL (ref 0.7–4.0)
MCHC: 34 g/dL (ref 30.0–36.0)
MCV: 85.3 fl (ref 78.0–100.0)
Monocytes Absolute: 0.3 K/uL (ref 0.1–1.0)
Monocytes Relative: 4.6 % (ref 3.0–12.0)
Neutro Abs: 5.2 K/uL (ref 1.4–7.7)
Neutrophils Relative %: 74.5 % (ref 43.0–77.0)
Platelets: 269 K/uL (ref 150.0–400.0)
RBC: 4.41 Mil/uL (ref 3.87–5.11)
RDW: 13.1 % (ref 11.5–15.5)
WBC: 7 K/uL (ref 4.0–10.5)

## 2024-09-16 LAB — LIPID PANEL
Cholesterol: 134 mg/dL (ref 0–200)
HDL: 62.7 mg/dL (ref >39.00)
LDL Cholesterol: 48 mg/dL (ref 0–99)
NonHDL: 71.54
Total CHOL/HDL Ratio: 2
Triglycerides: 116 mg/dL (ref 0.0–149.0)
VLDL: 23.2 mg/dL (ref 0.0–40.0)

## 2024-09-16 LAB — BASIC METABOLIC PANEL WITH GFR
BUN: 16 mg/dL (ref 6–23)
CO2: 30 meq/L (ref 19–32)
Calcium: 9.6 mg/dL (ref 8.4–10.5)
Chloride: 99 meq/L (ref 96–112)
Creatinine, Ser: 1.11 mg/dL (ref 0.40–1.20)
GFR: 52.17 mL/min — ABNORMAL LOW (ref >60.00)
Glucose, Bld: 91 mg/dL (ref 70–99)
Potassium: 4.8 meq/L (ref 3.5–5.1)
Sodium: 138 meq/L (ref 135–145)

## 2024-09-16 LAB — HEPATIC FUNCTION PANEL
ALT: 18 U/L (ref 0–35)
AST: 15 U/L (ref 0–37)
Albumin: 4.9 g/dL (ref 3.5–5.2)
Alkaline Phosphatase: 79 U/L (ref 39–117)
Bilirubin, Direct: 0.1 mg/dL (ref 0.0–0.3)
Total Bilirubin: 0.7 mg/dL (ref 0.2–1.2)
Total Protein: 7.5 g/dL (ref 6.0–8.3)

## 2024-09-16 LAB — MICROALBUMIN / CREATININE URINE RATIO
Creatinine,U: 43.2 mg/dL
Microalb Creat Ratio: UNDETERMINED mg/g (ref 0.0–30.0)
Microalb, Ur: <0.7 mg/dL

## 2024-09-16 LAB — HEMOGLOBIN A1C: Hgb A1c MFr Bld: 6 % (ref 4.6–6.5)

## 2024-09-16 LAB — VITAMIN D 25 HYDROXY (VIT D DEFICIENCY, FRACTURES): VITD: 108.14 ng/mL (ref 30.00–100.00)

## 2024-09-16 LAB — TSH: TSH: 1.06 u[IU]/mL (ref 0.35–5.50)

## 2024-09-16 MED ORDER — TIRZEPATIDE 15 MG/0.5ML ~~LOC~~ SOAJ
15.0000 mg | SUBCUTANEOUS | 11 refills | Status: AC
  Filled 2024-10-10: qty 2, 28d supply, fill #0
  Filled 2024-11-14: qty 2, 28d supply, fill #1

## 2024-09-16 MED ORDER — BUPROPION HCL ER (XL) 150 MG PO TB24
150.0000 mg | ORAL_TABLET | Freq: Every day | ORAL | 3 refills | Status: AC
Start: 2024-09-16 — End: ?
  Filled 2024-09-16: qty 90, 90d supply, fill #0

## 2024-09-16 NOTE — Progress Notes (Signed)
   Subjective:    Patient ID: Tracy Anderson, female    DOB: Dec 21, 1960, 63 y.o.   MRN: [id number]  HPI Here for a well exam. She feels fine. She is pleased with the Mounjaro  and she has no side effects to report.    Review of Systems  Constitutional: Negative.   HENT: Negative.    Eyes: Negative.   Respiratory: Negative.    Cardiovascular: Negative.   Gastrointestinal: Negative.   Genitourinary:  Negative for decreased urine volume, difficulty urinating, dyspareunia, dysuria, enuresis, flank pain, frequency, hematuria, pelvic pain and urgency.  Musculoskeletal: Negative.   Skin: Negative.   Neurological: Negative.  Negative for headaches.  Psychiatric/Behavioral: Negative.         Objective:   Physical Exam Constitutional:      General: She is not in acute distress.    Appearance: Normal appearance. She is well-developed.  HENT:     Head: Normocephalic and atraumatic.     Right Ear: External ear normal.     Left Ear: External ear normal.     Nose: Nose normal.     Mouth/Throat:     Pharynx: No oropharyngeal exudate.  Eyes:     General: No scleral icterus.    Conjunctiva/sclera: Conjunctivae normal.     Pupils: Pupils are equal, round, and reactive to light.  Neck:     Thyroid : No thyromegaly.     Vascular: No JVD.  Cardiovascular:     Rate and Rhythm: Normal rate and regular rhythm.     Pulses: Normal pulses.     Heart sounds: Normal heart sounds. No murmur heard.    No friction rub. No gallop.  Pulmonary:     Effort: Pulmonary effort is normal. No respiratory distress.     Breath sounds: Normal breath sounds. No wheezing or rales.  Chest:     Chest wall: No tenderness.  Abdominal:     General: Bowel sounds are normal. There is no distension.     Palpations: Abdomen is soft. There is no mass.     Tenderness: There is no abdominal tenderness. There is no guarding or rebound.  Musculoskeletal:        General: No tenderness. Normal range of motion.      Cervical back: Normal range of motion and neck supple.  Lymphadenopathy:     Cervical: No cervical adenopathy.  Skin:    General: Skin is warm and dry.     Findings: No erythema or rash.  Neurological:     General: No focal deficit present.     Mental Status: She is alert and oriented to person, place, and time.     Cranial Nerves: No cranial nerve deficit.     Motor: No abnormal muscle tone.     Coordination: Coordination normal.     Deep Tendon Reflexes: Reflexes are normal and symmetric. Reflexes normal.  Psychiatric:        Mood and Affect: Mood normal.        Behavior: Behavior normal.        Thought Content: Thought content normal.        Judgment: Judgment normal.           Assessment & Plan:  Well exam. We discussed diet and exercise. Get fasting labs. Garnette Olmsted, MD

## 2024-09-16 NOTE — Telephone Encounter (Signed)
 FYI Only or Action Required?: Action required by provider: critical lab results.  Patient was last seen in primary care on 09/16/2024 by Tracy Anderson LABOR, MD.  Called Nurse Triage reporting Advice Only.  Symptoms began information only.  Interventions attempted: Other: information only.  Symptoms are: information only.  Triage Disposition: Call PCP When Office is Open  Patient/caregiver understands and will follow disposition?: Yes                            Copied from CRM [id number]. Topic: Clinical - Red Word Triage >> Sep 16, 2024 11:42 AM Laymon HERO wrote: Red Word that prompted transfer to Nurse Triage: [facility] calling with critical lab results.  Reason for Disposition  [1] Caller requesting NON-URGENT health information AND [2] PCP's office is the best resource  Answer Assessment - Initial Assessment Questions 1. REASON FOR CALL: What is the main reason for your call? or How can I best help you?     Saa with [name] on [name] is calling to report a critical vitamin D  level of 108.14.  PAS stated she attempted to call [facility] x 4. This RN called [facility] and spoke to [place] and was transferred to [place]. This RN relayed results to Nancy.  Protocols used: Information Only Call - No Triage-A-AH

## 2024-09-16 NOTE — Addendum Note (Signed)
 Addended by: LADONNA INOCENTE SAILOR on: 09/16/2024 11:14 AM   Modules accepted: Orders

## 2024-09-16 NOTE — Telephone Encounter (Signed)
 We are still waiting for the rest of the results. Her vitamin D  level is too high, so tell her to decrease the Vitamin D  capsules to taking it every other week only (twice a month)

## 2024-09-17 ENCOUNTER — Ambulatory Visit: Payer: Self-pay | Admitting: Family Medicine

## 2024-09-17 ENCOUNTER — Encounter: Payer: Self-pay | Admitting: Pharmacist

## 2024-09-17 ENCOUNTER — Other Ambulatory Visit: Payer: Self-pay

## 2024-09-17 NOTE — Telephone Encounter (Signed)
 Patient informed of the results as below.

## 2024-09-19 ENCOUNTER — Ambulatory Visit
Admission: RE | Admit: 2024-09-19 | Discharge: 2024-09-19 | Disposition: A | Discharge status: Home / Self Care | Source: Ambulatory Visit | Attending: Family Medicine | Admitting: Family Medicine

## 2024-09-19 DIAGNOSIS — Z1231 Encounter for screening mammogram for malignant neoplasm of breast: Secondary | ICD-10-CM

## 2024-10-10 ENCOUNTER — Other Ambulatory Visit: Payer: Self-pay

## 2024-10-22 ENCOUNTER — Other Ambulatory Visit (HOSPITAL_COMMUNITY): Payer: Self-pay

## 2024-10-22 ENCOUNTER — Other Ambulatory Visit: Payer: Self-pay

## 2024-11-05 ENCOUNTER — Other Ambulatory Visit (HOSPITAL_COMMUNITY): Payer: Self-pay

## 2024-11-14 ENCOUNTER — Other Ambulatory Visit: Payer: Self-pay

## 2024-11-17 ENCOUNTER — Other Ambulatory Visit: Payer: Self-pay

## 2025-03-20 ENCOUNTER — Ambulatory Visit: Admitting: Family Medicine
